# Patient Record
Sex: Female | Born: 1945 | Race: White | Hispanic: No | Marital: Married | Smoking: Former smoker
Health system: Southern US, Community
[De-identification: ages and names within clinical notes are randomized; demographics above are authoritative.]

## PROBLEM LIST (undated history)

## (undated) DIAGNOSIS — M5431 Sciatica, right side: Secondary | ICD-10-CM

## (undated) DIAGNOSIS — C801 Malignant (primary) neoplasm, unspecified: Secondary | ICD-10-CM

## (undated) DIAGNOSIS — I809 Phlebitis and thrombophlebitis of unspecified site: Secondary | ICD-10-CM

## (undated) DIAGNOSIS — E039 Hypothyroidism, unspecified: Secondary | ICD-10-CM

## (undated) DIAGNOSIS — I10 Essential (primary) hypertension: Secondary | ICD-10-CM

## (undated) DIAGNOSIS — M1990 Unspecified osteoarthritis, unspecified site: Secondary | ICD-10-CM

## (undated) DIAGNOSIS — E119 Type 2 diabetes mellitus without complications: Secondary | ICD-10-CM

## (undated) DIAGNOSIS — E079 Disorder of thyroid, unspecified: Secondary | ICD-10-CM

## (undated) DIAGNOSIS — M539 Dorsopathy, unspecified: Secondary | ICD-10-CM

## (undated) DIAGNOSIS — I499 Cardiac arrhythmia, unspecified: Secondary | ICD-10-CM

## (undated) DIAGNOSIS — Z972 Presence of dental prosthetic device (complete) (partial): Secondary | ICD-10-CM

## (undated) DIAGNOSIS — E785 Hyperlipidemia, unspecified: Secondary | ICD-10-CM

## (undated) DIAGNOSIS — M5432 Sciatica, left side: Secondary | ICD-10-CM

## (undated) DIAGNOSIS — J302 Other seasonal allergic rhinitis: Secondary | ICD-10-CM

## (undated) HISTORY — PX: TONSILLECTOMY: SUR1361

## (undated) HISTORY — PX: KNEE ARTHROSCOPY: SUR90

## (undated) HISTORY — PX: TUMOR REMOVAL: SHX12

## (undated) HISTORY — DX: Malignant (primary) neoplasm, unspecified: C80.1

## (undated) HISTORY — DX: Type 2 diabetes mellitus without complications: E11.9

## (undated) HISTORY — DX: Disorder of thyroid, unspecified: E07.9

## (undated) HISTORY — DX: Unspecified osteoarthritis, unspecified site: M19.90

## (undated) HISTORY — PX: ABDOMINAL HYSTERECTOMY: SHX81

## (undated) HISTORY — PX: GRAFT APPLICATION: SHX6696

## (undated) HISTORY — PX: APPENDECTOMY: SHX54

---

## 2010-11-08 DIAGNOSIS — A4902 Methicillin resistant Staphylococcus aureus infection, unspecified site: Secondary | ICD-10-CM

## 2010-11-08 HISTORY — DX: Methicillin resistant Staphylococcus aureus infection, unspecified site: A49.02

## 2016-11-08 DIAGNOSIS — B029 Zoster without complications: Secondary | ICD-10-CM

## 2016-11-08 HISTORY — DX: Zoster without complications: B02.9

## 2019-11-15 ENCOUNTER — Encounter: Admission: RE | Payer: Self-pay | Source: Home / Self Care

## 2019-11-15 ENCOUNTER — Ambulatory Visit
Admission: RE | Admit: 2019-11-15 | Payer: Federal, State, Local not specified - PPO | Source: Home / Self Care | Admitting: Internal Medicine

## 2019-11-15 SURGERY — COLONOSCOPY WITH PROPOFOL
Anesthesia: General

## 2019-11-26 ENCOUNTER — Other Ambulatory Visit: Payer: Self-pay

## 2019-11-26 ENCOUNTER — Encounter (INDEPENDENT_AMBULATORY_CARE_PROVIDER_SITE_OTHER): Payer: Self-pay | Admitting: Vascular Surgery

## 2019-11-26 ENCOUNTER — Ambulatory Visit (INDEPENDENT_AMBULATORY_CARE_PROVIDER_SITE_OTHER): Payer: Federal, State, Local not specified - PPO | Admitting: Vascular Surgery

## 2019-11-26 VITALS — BP 181/72 | HR 76 | Resp 16 | Ht 68.0 in | Wt 201.0 lb

## 2019-11-26 DIAGNOSIS — E039 Hypothyroidism, unspecified: Secondary | ICD-10-CM | POA: Insufficient documentation

## 2019-11-26 DIAGNOSIS — I890 Lymphedema, not elsewhere classified: Secondary | ICD-10-CM | POA: Diagnosis not present

## 2019-11-26 DIAGNOSIS — J309 Allergic rhinitis, unspecified: Secondary | ICD-10-CM | POA: Insufficient documentation

## 2019-11-26 DIAGNOSIS — E785 Hyperlipidemia, unspecified: Secondary | ICD-10-CM | POA: Insufficient documentation

## 2019-11-26 DIAGNOSIS — I10 Essential (primary) hypertension: Secondary | ICD-10-CM | POA: Insufficient documentation

## 2019-11-26 DIAGNOSIS — I83013 Varicose veins of right lower extremity with ulcer of ankle: Secondary | ICD-10-CM | POA: Diagnosis not present

## 2019-11-26 DIAGNOSIS — E119 Type 2 diabetes mellitus without complications: Secondary | ICD-10-CM | POA: Diagnosis not present

## 2019-11-26 DIAGNOSIS — I872 Venous insufficiency (chronic) (peripheral): Secondary | ICD-10-CM

## 2019-11-26 DIAGNOSIS — L97319 Non-pressure chronic ulcer of right ankle with unspecified severity: Secondary | ICD-10-CM

## 2019-11-26 DIAGNOSIS — B029 Zoster without complications: Secondary | ICD-10-CM | POA: Insufficient documentation

## 2019-11-26 DIAGNOSIS — M109 Gout, unspecified: Secondary | ICD-10-CM | POA: Insufficient documentation

## 2019-11-26 DIAGNOSIS — E782 Mixed hyperlipidemia: Secondary | ICD-10-CM

## 2019-11-26 NOTE — Progress Notes (Signed)
MRN : [id number]  [name] is a 74 y.o. (03/05/1946) female who presents with chief complaint of  Chief Complaint  Patient presents with  . New Patient (Initial Visit)    Venous Stasis  .  History of Present Illness:   Patient is seen for evaluation of leg pain and swelling associated with new onset ulceration. The patient first noticed the swelling remotely. The swelling is associated with a burning pain and discoloration. The burning pain and swelling worsens with prolonged dependency and improves with elevation. The pain is unrelated to activity.  The patient notes that in the morning the legs are better but the leg symptoms worsened throughout the course of the day. The patient has also noted a progressive worsening of the discoloration in the ankle and shin area.   The patient notes that an ulcer has developed acutely without specific trauma and since it occurred it has been very slow to heal.  There is a moderate amount of drainage associated with the open area.    The patient denies claudication symptoms or rest pain symptoms.  The patient denies DJD and LS spine disease.  The patient has not had any past angiography, interventions or vascular surgery.  Elevation makes the leg symptoms better, dependency makes them much worse. The patient denies any recent changes in medications.  The patient has not been wearing graduated compression routinely but has tried compression in the past.  The patient denies a history of DVT or PE. There is a prior history of phlebitis. There is no history of primary lymphedema.  No history of malignancies. No history of trauma or groin or pelvic surgery. There is no history of radiation treatment to the groin or pelvis       No outpatient medications have been marked as taking for the 11/26/19 encounter (Office Visit) with Delana Meyer, Dolores Lory, MD.    No past medical history on file.    Social History Social History   Tobacco Use    . Smoking status: Former Smoker    Quit date: 11/25/1994    Years since quitting: 25.0  . Smokeless tobacco: Never Used  Substance Use Topics  . Alcohol use: Not on file  . Drug use: Not on file    Family History No family history on file.  No family history of bleeding/clotting disorders, porphyria or autoimmune disease   Allergies  Allergen Reactions  . Amoxicillin-Pot Clavulanate Nausea And Vomiting  . Codeine      REVIEW OF SYSTEMS (Negative unless checked)  Constitutional: [] Weight loss  [] Fever  [] Chills Cardiac: [] Chest pain   [] Chest pressure   [] Palpitations   [] Shortness of breath when laying flat   [] Shortness of breath with exertion. Vascular:  [] Pain in legs with walking   [x] Pain in legs at rest  [] History of DVT   [x] Phlebitis   [x] Swelling in legs   [x] Varicose veins   [] Non-healing ulcers Pulmonary:   [] Uses home oxygen   [] Productive cough   [] Hemoptysis   [] Wheeze  [] COPD   [] Asthma Neurologic:  [] Dizziness   [] Seizures   [] History of stroke   [] History of TIA  [] Aphasia   [] Vissual changes   [] Weakness or numbness in arm   [] Weakness or numbness in leg Musculoskeletal:   [] Joint swelling   [] Joint pain   [] Low back pain Hematologic:  [] Easy bruising  [] Easy bleeding   [] Hypercoagulable state   [] Anemic Gastrointestinal:  [] Diarrhea   [] Vomiting  [] Gastroesophageal reflux/heartburn   [] Difficulty swallowing.  Genitourinary:  [] Chronic kidney disease   [] Difficult urination  [] Frequent urination   [] Blood in urine Skin:  [] Rashes   [] Ulcers  Psychological:  [] History of anxiety   []  History of major depression.  Physical Examination  Vitals:   11/26/19 0909  BP: (!) 181/72  Pulse: 76  Resp: 16  Weight: 201 lb (91.2 kg)  Height: 5\' 8"  (1.727 m)   Body mass index is 30.56 kg/m. Gen: WD/WN, NAD Head: [place]/AT, No temporalis wasting.  Ear/Nose/Throat: Hearing grossly intact, nares w/o erythema or drainage, poor dentition Eyes: PER, EOMI, sclera nonicteric.   Neck: Supple, no masses.  No bruit or JVD.  Pulmonary:  Good air movement, clear to auscultation bilaterally, no use of accessory muscles.  Cardiac: RRR, normal S1, S2, no Murmurs. Vascular: 2-3+ edema of the right leg with severe venous changes of the right leg.  Venous ulcer noted in the ankle area on the right, noninfected Vessel Right Left  Radial Palpable Palpable  PT Palpable Palpable  DP Palpable Palpable  Gastrointestinal: soft, non-distended. No guarding/no peritoneal signs.  Musculoskeletal: M/S 5/5 throughout.  No deformity or atrophy.  Neurologic: CN 2-12 intact. Pain and light touch intact in extremities.  Symmetrical.  Speech is fluent. Motor exam as listed above. Psychiatric: Judgment intact, Mood & affect appropriate for pt's clinical situation. Dermatologic: Venous stasis dermatitis with ulcers present on the right.  No changes consistent with cellulitis. Lymph : No Cervical lymphadenopathy, no lichenification or skin changes of chronic lymphedema.  CBC No results found for: WBC, HGB, HCT, MCV, PLT  BMET No results found for: NA, K, CL, CO2, GLUCOSE, BUN, CREATININE, CALCIUM, GFRNONAA, GFRAA CrCl cannot be calculated (No successful lab value found.).  COAG No results found for: INR, PROTIME  Radiology No results found.  Assessment/Plan 1. Chronic venous insufficiency No surgery or intervention at this point in time.    I have had a long discussion with the patient regarding venous insufficiency and why it  causes symptoms, specifically venous ulceration . I have discussed with the patient the chronic skin changes that accompany venous insufficiency and the long term sequela such as infection and recurring  ulceration.  Patient will be placed in [employer] which will be changed weekly drainage permitting.  In addition, behavioral modification including several periods of elevation of the lower extremities during the day will be continued. Achieving a position with  the ankles at heart level was stressed to the patient  The patient is instructed to begin routine exercise, especially walking on a daily basis  Patient should undergo duplex ultrasound of the venous system to ensure that DVT or reflux is not present.  Following the review of the ultrasound the patient will follow up in one week to reassess the degree of swelling and the control that Unna therapy is offering.   The patient can be assessed for graduated compression stockings or wraps as well as a Lymph Pump once the ulcers are healed.  - VAS [place] LOWER EXTREMITY VENOUS REFLUX; Future  2. Lymphedema I have had a long discussion with the patient regarding swelling and why it  causes symptoms.  Patient will begin wearing graduated compression stockings class 1 (20-30 mmHg) on a daily basis a prescription was given. The patient will  beginning wearing the stockings first thing in the morning and removing them in the evening. The patient is instructed specifically not to sleep in the stockings.   In addition, behavioral modification will be initiated.  This will  include frequent elevation, use of over the counter pain medications and exercise such as walking.  I have reviewed systemic causes for chronic edema such as liver, kidney and cardiac etiologies.  The patient denies problems with these organ systems.    Consideration for a lymph pump will also be made based upon the effectiveness of conservative therapy.  This would help to improve the edema control and prevent sequela such as ulcers and infections   Patient should undergo duplex ultrasound of the venous system to ensure that DVT or reflux is not present.  The patient will follow-up with me after the ultrasound.    3. Venous ulcer of ankle, right (HCC) No surgery or intervention at this point in time.    I have had a long discussion with the patient regarding venous insufficiency and why it  causes symptoms, specifically venous ulceration  . I have discussed with the patient the chronic skin changes that accompany venous insufficiency and the long term sequela such as infection and recurring  ulceration.  Patient will be placed in [employer] which will be changed weekly drainage permitting.  In addition, behavioral modification including several periods of elevation of the lower extremities during the day will be continued. Achieving a position with the ankles at heart level was stressed to the patient  The patient is instructed to begin routine exercise, especially walking on a daily basis  Patient should undergo duplex ultrasound of the venous system to ensure that DVT or reflux is not present.  Following the review of the ultrasound the patient will follow up in one week to reassess the degree of swelling and the control that Unna therapy is offering.   The patient can be assessed for graduated compression stockings or wraps as well as a Lymph Pump once the ulcers are healed.  - VAS [place] LOWER EXTREMITY VENOUS REFLUX; Future  4. Type 2 diabetes mellitus without complication, without long-term current use of insulin (HCC) Continue hypoglycemic medications as already ordered, these medications have been reviewed and there are no changes at this time.  Hgb A1C to be monitored as already arranged by primary service   5. Mixed hyperlipidemia Continue statin as ordered and reviewed, no changes at this time    Hortencia Pilar, MD  11/26/2019 9:16 AM

## 2019-12-03 ENCOUNTER — Ambulatory Visit (INDEPENDENT_AMBULATORY_CARE_PROVIDER_SITE_OTHER): Payer: Federal, State, Local not specified - PPO | Admitting: Nurse Practitioner

## 2019-12-03 ENCOUNTER — Other Ambulatory Visit: Payer: Self-pay

## 2019-12-03 VITALS — BP 183/71 | HR 82 | Resp 16 | Wt 200.8 lb

## 2019-12-03 DIAGNOSIS — I83013 Varicose veins of right lower extremity with ulcer of ankle: Secondary | ICD-10-CM | POA: Diagnosis not present

## 2019-12-03 DIAGNOSIS — L97319 Non-pressure chronic ulcer of right ankle with unspecified severity: Secondary | ICD-10-CM | POA: Diagnosis not present

## 2019-12-03 NOTE — Progress Notes (Signed)
History of Present Illness  There is no documented history at this time  Assessments & Plan   There are no diagnoses linked to this encounter.    Additional instructions  Subjective:  Patient presents with venous ulcer of the Right lower extremity.    Procedure:  3 layer unna wrap was placed Right lower extremity.   Plan:   Follow up in one week.   

## 2019-12-10 ENCOUNTER — Ambulatory Visit (INDEPENDENT_AMBULATORY_CARE_PROVIDER_SITE_OTHER): Payer: Federal, State, Local not specified - PPO | Admitting: Nurse Practitioner

## 2019-12-10 ENCOUNTER — Other Ambulatory Visit: Payer: Self-pay

## 2019-12-10 ENCOUNTER — Encounter (INDEPENDENT_AMBULATORY_CARE_PROVIDER_SITE_OTHER): Payer: Self-pay

## 2019-12-10 VITALS — BP 179/72 | HR 82 | Resp 15 | Wt 201.2 lb

## 2019-12-10 DIAGNOSIS — L97319 Non-pressure chronic ulcer of right ankle with unspecified severity: Secondary | ICD-10-CM | POA: Diagnosis not present

## 2019-12-10 DIAGNOSIS — I83013 Varicose veins of right lower extremity with ulcer of ankle: Secondary | ICD-10-CM | POA: Diagnosis not present

## 2019-12-10 NOTE — Progress Notes (Signed)
History of Present Illness  There is no documented history at this time  Assessments & Plan   There are no diagnoses linked to this encounter.    Additional instructions  Subjective:  Patient presents with venous ulcer of the Right lower extremity.    Procedure:  3 layer unna wrap was placed Right lower extremity.   Plan:   Follow up in one week.   

## 2019-12-17 ENCOUNTER — Encounter (INDEPENDENT_AMBULATORY_CARE_PROVIDER_SITE_OTHER): Payer: Medicare Other

## 2019-12-18 ENCOUNTER — Encounter (INDEPENDENT_AMBULATORY_CARE_PROVIDER_SITE_OTHER): Payer: Self-pay | Admitting: Nurse Practitioner

## 2019-12-18 ENCOUNTER — Other Ambulatory Visit: Payer: Self-pay

## 2019-12-18 ENCOUNTER — Ambulatory Visit (INDEPENDENT_AMBULATORY_CARE_PROVIDER_SITE_OTHER): Payer: Federal, State, Local not specified - PPO | Admitting: Nurse Practitioner

## 2019-12-18 VITALS — BP 160/70 | HR 73 | Resp 12 | Ht 68.0 in | Wt 200.0 lb

## 2019-12-18 DIAGNOSIS — I890 Lymphedema, not elsewhere classified: Secondary | ICD-10-CM | POA: Diagnosis not present

## 2019-12-18 NOTE — Progress Notes (Signed)
History of Present Illness  There is no documented history at this time  Assessments & Plan   There are no diagnoses linked to this encounter.    Additional instructions  Subjective:  Patient presents with venous ulcer of the Right lower extremity.    Procedure:  3 layer unna wrap was placed Right lower extremity.   Plan:   Follow up in one week.   

## 2019-12-24 ENCOUNTER — Encounter (INDEPENDENT_AMBULATORY_CARE_PROVIDER_SITE_OTHER): Payer: Self-pay | Admitting: Vascular Surgery

## 2019-12-24 ENCOUNTER — Ambulatory Visit (INDEPENDENT_AMBULATORY_CARE_PROVIDER_SITE_OTHER): Payer: Federal, State, Local not specified - PPO

## 2019-12-24 ENCOUNTER — Ambulatory Visit (INDEPENDENT_AMBULATORY_CARE_PROVIDER_SITE_OTHER): Payer: Federal, State, Local not specified - PPO | Admitting: Vascular Surgery

## 2019-12-24 ENCOUNTER — Other Ambulatory Visit: Payer: Self-pay

## 2019-12-24 VITALS — BP 205/79 | HR 86 | Resp 14 | Ht 68.0 in | Wt 199.0 lb

## 2019-12-24 DIAGNOSIS — I872 Venous insufficiency (chronic) (peripheral): Secondary | ICD-10-CM | POA: Diagnosis not present

## 2019-12-24 DIAGNOSIS — I890 Lymphedema, not elsewhere classified: Secondary | ICD-10-CM | POA: Diagnosis not present

## 2019-12-24 DIAGNOSIS — E782 Mixed hyperlipidemia: Secondary | ICD-10-CM

## 2019-12-24 DIAGNOSIS — L97319 Non-pressure chronic ulcer of right ankle with unspecified severity: Secondary | ICD-10-CM

## 2019-12-24 DIAGNOSIS — I83013 Varicose veins of right lower extremity with ulcer of ankle: Secondary | ICD-10-CM

## 2019-12-24 DIAGNOSIS — I10 Essential (primary) hypertension: Secondary | ICD-10-CM | POA: Diagnosis not present

## 2019-12-24 DIAGNOSIS — E119 Type 2 diabetes mellitus without complications: Secondary | ICD-10-CM

## 2019-12-24 NOTE — Progress Notes (Signed)
MRN : [id number]  Marvelle Morrell is a 74 y.o. (Jul 13, 1946) female who presents with chief complaint of No chief complaint on file. Marland Kitchen  History of Present Illness:   The patient returns to the office for followup evaluation regarding leg swelling.  The swelling has persisted and the pain associated with swelling continues. There have not been any interval development of a ulcerations or wounds.  Since the previous visit the patient has been wearing graduated compression stockings and has noted little if any improvement in the lymphedema. The patient has been using compression routinely morning until night.  The patient also states elevation during the day and exercise is being done too.  Duplex ultrasound of the venous system shows normal deep venous system.  Superficial reflux is noted in the bilateral great saphenous veins.  There is also reflux noted at the saphenous popliteal junction bilaterally.  No outpatient medications have been marked as taking for the 12/24/19 encounter (Appointment) with Delana Meyer, Dolores Lory, MD.    Past Medical History:  Diagnosis Date  . Arthritis   . Cancer ([place])   . Diabetes mellitus without complication ([place])   . Thyroid disease     Past Surgical History:  Procedure Laterality Date  . ABDOMINAL HYSTERECTOMY    . GRAFT APPLICATION    . TUMOR REMOVAL      Social History Social History   Tobacco Use  . Smoking status: Former Smoker    Quit date: 11/25/1994    Years since quitting: 25.0  . Smokeless tobacco: Never Used  Substance Use Topics  . Alcohol use: Not on file  . Drug use: Not on file    Family History Family History  Problem Relation Age of Onset  . Varicose Veins Mother   . Heart disease Mother   . Heart disease Father   . Diabetes Father   . Heart disease Brother   . Diabetes Brother   . Cancer Daughter     Allergies  Allergen Reactions  . Amoxicillin-Pot Clavulanate Nausea And Vomiting  . Codeine      REVIEW OF  SYSTEMS (Negative unless checked)  Constitutional: [] Weight loss  [] Fever  [] Chills Cardiac: [] Chest pain   [] Chest pressure   [] Palpitations   [] Shortness of breath when laying flat   [] Shortness of breath with exertion. Vascular:  [] Pain in legs with walking   [x] Pain in legs at rest  [] History of DVT   [] Phlebitis   [x] Swelling in legs   [] Varicose veins   [] Non-healing ulcers Pulmonary:   [] Uses home oxygen   [] Productive cough   [] Hemoptysis   [] Wheeze  [] COPD   [] Asthma Neurologic:  [] Dizziness   [] Seizures   [] History of stroke   [] History of TIA  [] Aphasia   [] Vissual changes   [] Weakness or numbness in arm   [] Weakness or numbness in leg Musculoskeletal:   [] Joint swelling   [] Joint pain   [] Low back pain Hematologic:  [] Easy bruising  [] Easy bleeding   [] Hypercoagulable state   [] Anemic Gastrointestinal:  [] Diarrhea   [] Vomiting  [] Gastroesophageal reflux/heartburn   [] Difficulty swallowing. Genitourinary:  [] Chronic kidney disease   [] Difficult urination  [] Frequent urination   [] Blood in urine Skin:  [] Rashes   [] Ulcers  Psychological:  [] History of anxiety   []  History of major depression.  Physical Examination  There were no vitals filed for this visit. There is no height or weight on file to calculate BMI. Gen: WD/WN, NAD Head: [place]/AT, No temporalis wasting.  Ear/Nose/Throat: Hearing  grossly intact, nares w/o erythema or drainage Eyes: PER, EOMI, sclera nonicteric.  Neck: Supple, no large masses.   Pulmonary:  Good air movement, no audible wheezing bilaterally, no use of accessory muscles.  Cardiac: RRR, no JVD Vascular: scattered varicosities 6-8 mm present bilaterally.  Mild venous stasis changes to the legs bilaterally, with a severe patch at the right ankle.  2+ soft pitting edema Gastrointestinal: Non-distended. No guarding/no peritoneal signs.  Musculoskeletal: M/S 5/5 throughout.  No deformity or atrophy.  Neurologic: CN 2-12 intact. Symmetrical.  Speech is fluent.  Motor exam as listed above. Psychiatric: Judgment intact, Mood & affect appropriate for pt's clinical situation. Dermatologic: venous rashes no ulcers noted.  No changes consistent with cellulitis. Lymph : No lichenification or skin changes of chronic lymphedema.  CBC No results found for: WBC, HGB, HCT, MCV, PLT  BMET No results found for: NA, K, CL, CO2, GLUCOSE, BUN, CREATININE, CALCIUM, GFRNONAA, GFRAA CrCl cannot be calculated (No successful lab value found.).  COAG No results found for: INR, PROTIME  Radiology No results found.   Assessment/Plan 1. Chronic venous insufficiency Recommend  I have reviewed my previous  discussion with the patient regarding  varicose veins and why they cause symptoms. Patient will continue  wearing graduated compression stockings class 1 on a daily basis, beginning first thing in the morning and removing them in the evening.    In addition, behavioral modification including elevation during the day was again discussed and this will continue.  The patient has utilized over the counter pain medications and has been exercising.  However, at this time conservative therapy has not alleviated the patient's symptoms of leg pain and swelling  I discussed laser ablation of the right and  left great saphenous veins to eliminate the symptoms of pain and swelling of the lower extremities caused by the severe superficial venous reflux disease. She is OK with continuing compression.  She has several issues coming to take care of so she does not want to pursue ablation at this time  2. Lymphedema Recommend  I have reviewed my previous  discussion with the patient regarding  varicose veins and why they cause symptoms. Patient will continue  wearing graduated compression stockings class 1 on a daily basis, beginning first thing in the morning and removing them in the evening.    In addition, behavioral modification including elevation during the day was again  discussed and this will continue.  The patient has utilized over the counter pain medications and has been exercising.  However, at this time conservative therapy has not alleviated the patient's symptoms of leg pain and swelling  I discussed laser ablation of the right and  left great saphenous veins to eliminate the symptoms of pain and swelling of the lower extremities caused by the severe superficial venous reflux disease. She is OK with continuing compression.  She has several issues coming to take care of so she does not want to pursue ablation at this time   3. Mixed hyperlipidemia Continue statin as ordered and reviewed, no changes at this time   4. Essential hypertension Continue antihypertensive medications as already ordered, these medications have been reviewed and there are no changes at this time.   5. Type 2 diabetes mellitus without complication, without long-term current use of insulin (HCC) Continue hypoglycemic medications as already ordered, these medications have been reviewed and there are no changes at this time.  Hgb A1C to be monitored as already arranged by primary service  Hortencia Pilar, MD  12/24/2019 11:07 AM

## 2019-12-25 ENCOUNTER — Other Ambulatory Visit: Payer: Self-pay | Admitting: Infectious Diseases

## 2019-12-25 DIAGNOSIS — I10 Essential (primary) hypertension: Secondary | ICD-10-CM

## 2019-12-29 ENCOUNTER — Ambulatory Visit: Payer: Federal, State, Local not specified - PPO | Attending: Internal Medicine

## 2019-12-29 ENCOUNTER — Ambulatory Visit: Payer: Self-pay

## 2019-12-29 DIAGNOSIS — Z23 Encounter for immunization: Secondary | ICD-10-CM | POA: Insufficient documentation

## 2019-12-29 NOTE — Progress Notes (Signed)
   Covid-19 Vaccination Clinic  Name:  [name]    MRN: [id number] DOB: 06-Jul-1946  12/29/2019  [name] was observed post Covid-19 immunization for 15 minutes without incidence. She was provided with Vaccine Information Sheet and instruction to access the V-Safe system.   [name] was instructed to call 911 with any severe reactions post vaccine: Marland Kitchen Difficulty breathing  . Swelling of your face and throat  . A fast heartbeat  . A bad rash all over your body  . Dizziness and weakness    Immunizations Administered    Name Date Dose VIS Date Route   Pfizer COVID-19 Vaccine 12/29/2019 12:13 PM 0.3 mL 10/19/2019 Intramuscular   Manufacturer: [place]   Lot: [id number]   [place]: [id number]

## 2020-01-22 ENCOUNTER — Ambulatory Visit: Payer: Federal, State, Local not specified - PPO | Attending: Internal Medicine

## 2020-01-22 DIAGNOSIS — Z23 Encounter for immunization: Secondary | ICD-10-CM

## 2020-01-22 NOTE — Progress Notes (Signed)
   Covid-19 Vaccination Clinic  Name:  [name]    MRN: [id number] DOB: 21-Feb-1946  01/22/2020  [name] was observed post Covid-19 immunization for 15 minutes without incident. She was provided with Vaccine Information Sheet and instruction to access the V-Safe system.   [name] was instructed to call 911 with any severe reactions post vaccine: Marland Kitchen Difficulty breathing  . Swelling of face and throat  . A fast heartbeat  . A bad rash all over body  . Dizziness and weakness   Immunizations Administered    Name Date Dose VIS Date Route   Pfizer COVID-19 Vaccine 01/22/2020  3:16 PM 0.3 mL 10/19/2019 Intramuscular   Manufacturer: [place]   Lot: [id number]   [place]: [id number]

## 2020-02-05 ENCOUNTER — Encounter: Payer: Self-pay | Admitting: Ophthalmology

## 2020-02-05 ENCOUNTER — Other Ambulatory Visit: Payer: Self-pay

## 2020-02-07 NOTE — Discharge Instructions (Signed)

## 2020-02-11 ENCOUNTER — Other Ambulatory Visit: Payer: Self-pay

## 2020-02-11 ENCOUNTER — Other Ambulatory Visit
Admission: RE | Admit: 2020-02-11 | Discharge: 2020-02-11 | Disposition: A | Discharge status: Home / Self Care | Payer: Federal, State, Local not specified - PPO | Source: Ambulatory Visit | Attending: Ophthalmology | Admitting: Ophthalmology

## 2020-02-11 DIAGNOSIS — Z20822 Contact with and (suspected) exposure to covid-19: Secondary | ICD-10-CM | POA: Insufficient documentation

## 2020-02-11 DIAGNOSIS — Z01812 Encounter for preprocedural laboratory examination: Secondary | ICD-10-CM | POA: Diagnosis not present

## 2020-02-11 LAB — SARS CORONAVIRUS 2 (TAT 6-24 HRS): SARS Coronavirus 2: NEGATIVE

## 2020-02-13 ENCOUNTER — Encounter
Admission: RE | Disposition: A | Discharge status: Home / Self Care | Payer: Self-pay | Source: Home / Self Care | Attending: Ophthalmology

## 2020-02-13 ENCOUNTER — Ambulatory Visit
Admission: RE | Admit: 2020-02-13 | Discharge: 2020-02-13 | Disposition: A | Discharge status: Home / Self Care | Payer: Federal, State, Local not specified - PPO | Attending: Ophthalmology | Admitting: Ophthalmology

## 2020-02-13 ENCOUNTER — Encounter: Payer: Self-pay | Admitting: Ophthalmology

## 2020-02-13 ENCOUNTER — Other Ambulatory Visit: Payer: Self-pay

## 2020-02-13 ENCOUNTER — Ambulatory Visit: Payer: Federal, State, Local not specified - PPO | Admitting: Anesthesiology

## 2020-02-13 DIAGNOSIS — I10 Essential (primary) hypertension: Secondary | ICD-10-CM | POA: Diagnosis not present

## 2020-02-13 DIAGNOSIS — E669 Obesity, unspecified: Secondary | ICD-10-CM | POA: Insufficient documentation

## 2020-02-13 DIAGNOSIS — Z6830 Body mass index (BMI) 30.0-30.9, adult: Secondary | ICD-10-CM | POA: Insufficient documentation

## 2020-02-13 DIAGNOSIS — Z79899 Other long term (current) drug therapy: Secondary | ICD-10-CM | POA: Diagnosis not present

## 2020-02-13 DIAGNOSIS — M549 Dorsalgia, unspecified: Secondary | ICD-10-CM | POA: Insufficient documentation

## 2020-02-13 DIAGNOSIS — J302 Other seasonal allergic rhinitis: Secondary | ICD-10-CM | POA: Insufficient documentation

## 2020-02-13 DIAGNOSIS — H2511 Age-related nuclear cataract, right eye: Secondary | ICD-10-CM | POA: Diagnosis not present

## 2020-02-13 DIAGNOSIS — Z85820 Personal history of malignant melanoma of skin: Secondary | ICD-10-CM | POA: Insufficient documentation

## 2020-02-13 DIAGNOSIS — M5430 Sciatica, unspecified side: Secondary | ICD-10-CM | POA: Insufficient documentation

## 2020-02-13 DIAGNOSIS — M1990 Unspecified osteoarthritis, unspecified site: Secondary | ICD-10-CM | POA: Insufficient documentation

## 2020-02-13 DIAGNOSIS — G629 Polyneuropathy, unspecified: Secondary | ICD-10-CM | POA: Insufficient documentation

## 2020-02-13 DIAGNOSIS — E039 Hypothyroidism, unspecified: Secondary | ICD-10-CM | POA: Diagnosis not present

## 2020-02-13 DIAGNOSIS — E119 Type 2 diabetes mellitus without complications: Secondary | ICD-10-CM | POA: Diagnosis not present

## 2020-02-13 DIAGNOSIS — Z87891 Personal history of nicotine dependence: Secondary | ICD-10-CM | POA: Diagnosis not present

## 2020-02-13 DIAGNOSIS — Z79890 Hormone replacement therapy (postmenopausal): Secondary | ICD-10-CM | POA: Insufficient documentation

## 2020-02-13 DIAGNOSIS — E785 Hyperlipidemia, unspecified: Secondary | ICD-10-CM | POA: Diagnosis not present

## 2020-02-13 DIAGNOSIS — Z791 Long term (current) use of non-steroidal anti-inflammatories (NSAID): Secondary | ICD-10-CM | POA: Diagnosis not present

## 2020-02-13 DIAGNOSIS — Z880 Allergy status to penicillin: Secondary | ICD-10-CM | POA: Insufficient documentation

## 2020-02-13 HISTORY — DX: Hyperlipidemia, unspecified: E78.5

## 2020-02-13 HISTORY — DX: Sciatica, right side: M54.31

## 2020-02-13 HISTORY — DX: Phlebitis and thrombophlebitis of unspecified site: I80.9

## 2020-02-13 HISTORY — DX: Hypothyroidism, unspecified: E03.9

## 2020-02-13 HISTORY — DX: Cardiac arrhythmia, unspecified: I49.9

## 2020-02-13 HISTORY — DX: Essential (primary) hypertension: I10

## 2020-02-13 HISTORY — DX: Dorsopathy, unspecified: M53.9

## 2020-02-13 HISTORY — PX: CATARACT EXTRACTION W/PHACO: SHX586

## 2020-02-13 HISTORY — DX: Presence of dental prosthetic device (complete) (partial): Z97.2

## 2020-02-13 HISTORY — DX: Sciatica, left side: M54.32

## 2020-02-13 HISTORY — DX: Other seasonal allergic rhinitis: J30.2

## 2020-02-13 SURGERY — PHACOEMULSIFICATION, CATARACT, WITH IOL INSERTION
Anesthesia: Monitor Anesthesia Care | Site: Eye | Laterality: Right

## 2020-02-13 MED ORDER — ARMC OPHTHALMIC DILATING DROPS
1.0000 "application " | OPHTHALMIC | Status: DC | PRN
  Administered 2020-02-13 (×3): 1 via OPHTHALMIC

## 2020-02-13 MED ORDER — EPINEPHRINE PF 1 MG/ML IJ SOLN
INTRAOCULAR | Status: DC | PRN
  Administered 2020-02-13: 104 mL via OPHTHALMIC

## 2020-02-13 MED ORDER — NA HYALUR & NA CHOND-NA HYALUR 0.4-0.35 ML IO KIT
PACK | INTRAOCULAR | Status: DC | PRN
  Administered 2020-02-13: 1 mL via INTRAOCULAR

## 2020-02-13 MED ORDER — BRIMONIDINE TARTRATE-TIMOLOL 0.2-0.5 % OP SOLN
OPHTHALMIC | Status: DC | PRN
  Administered 2020-02-13: 1 [drp] via OPHTHALMIC

## 2020-02-13 MED ORDER — FENTANYL CITRATE (PF) 100 MCG/2ML IJ SOLN
INTRAMUSCULAR | Status: DC | PRN
  Administered 2020-02-13: 50 ug via INTRAVENOUS

## 2020-02-13 MED ORDER — MIDAZOLAM HCL 2 MG/2ML IJ SOLN
INTRAMUSCULAR | Status: DC | PRN
  Administered 2020-02-13: 1 mg via INTRAVENOUS

## 2020-02-13 MED ORDER — LIDOCAINE HCL (PF) 2 % IJ SOLN
INTRAOCULAR | Status: DC | PRN
  Administered 2020-02-13: 1 mL

## 2020-02-13 MED ORDER — TETRACAINE HCL 0.5 % OP SOLN
1.0000 [drp] | OPHTHALMIC | Status: DC | PRN
  Administered 2020-02-13 (×3): 1 [drp] via OPHTHALMIC

## 2020-02-13 MED ORDER — CEFUROXIME OPHTHALMIC INJECTION 1 MG/0.1 ML
INJECTION | OPHTHALMIC | Status: DC | PRN
  Administered 2020-02-13: 0.1 mL via INTRACAMERAL

## 2020-02-13 MED ORDER — MOXIFLOXACIN HCL 0.5 % OP SOLN
1.0000 [drp] | OPHTHALMIC | Status: DC | PRN
  Administered 2020-02-13 (×3): 1 [drp] via OPHTHALMIC

## 2020-02-13 SURGICAL SUPPLY — 22 items
CANNULA ANT/CHMB 27G (MISCELLANEOUS) ×1 IMPLANT
CANNULA ANT/CHMB 27GA (MISCELLANEOUS) ×2 IMPLANT
GLOVE SURG LX 7.5 STRW (GLOVE) ×1
GLOVE SURG LX STRL 7.5 STRW (GLOVE) ×1 IMPLANT
GLOVE SURG TRIUMPH 8.0 PF LTX (GLOVE) ×2 IMPLANT
GOWN STRL REUS W/ TWL LRG LVL3 (GOWN DISPOSABLE) ×2 IMPLANT
GOWN STRL REUS W/TWL LRG LVL3 (GOWN DISPOSABLE) ×2
LENS IOL TECNIS ITEC 23.5 (Intraocular Lens) ×1 IMPLANT
MARKER SKIN DUAL TIP RULER LAB (MISCELLANEOUS) ×2 IMPLANT
NDL CAPSULORHEX 25GA (NEEDLE) ×1 IMPLANT
NDL FILTER BLUNT 18X1 1/2 (NEEDLE) ×2 IMPLANT
NEEDLE CAPSULORHEX 25GA (NEEDLE) ×2 IMPLANT
NEEDLE FILTER BLUNT 18X 1/2SAF (NEEDLE) ×2
NEEDLE FILTER BLUNT 18X1 1/2 (NEEDLE) ×2 IMPLANT
PACK CATARACT BRASINGTON (MISCELLANEOUS) ×2 IMPLANT
PACK EYE AFTER SURG (MISCELLANEOUS) ×2 IMPLANT
PACK OPTHALMIC (MISCELLANEOUS) ×2 IMPLANT
SOLUTION OPHTHALMIC SALT (MISCELLANEOUS) ×2 IMPLANT
SYR 3ML LL SCALE MARK (SYRINGE) ×4 IMPLANT
SYR TB 1ML LUER SLIP (SYRINGE) ×2 IMPLANT
WATER STERILE IRR 250ML POUR (IV SOLUTION) ×2 IMPLANT
WIPE NON LINTING 3.25X3.25 (MISCELLANEOUS) ×2 IMPLANT

## 2020-02-13 NOTE — Transfer of Care (Signed)
Immediate Anesthesia Transfer of Care Note  Patient: [name]  Procedure(s) Performed: CATARACT EXTRACTION PHACO AND INTRAOCULAR LENS PLACEMENT (IOC) RIGHT 18.82  02:15.0  13.9% (Right Eye)  Patient Location: PACU  Anesthesia Type: MAC  Level of Consciousness: awake, alert  and patient cooperative  Airway and Oxygen Therapy: Patient Spontanous Breathing and Patient connected to supplemental oxygen  Post-op Assessment: Post-op Vital signs reviewed, Patient's Cardiovascular Status Stable, Respiratory Function Stable, Patent Airway and No signs of Nausea or vomiting  Post-op Vital Signs: Reviewed and stable  Complications: No apparent anesthesia complications

## 2020-02-13 NOTE — Op Note (Signed)
LOCATION:  [place]   PREOPERATIVE DIAGNOSIS:    Nuclear sclerotic cataract right eye. H25.11   POSTOPERATIVE DIAGNOSIS:  Nuclear sclerotic cataract right eye.     PROCEDURE:  Phacoemusification with posterior chamber intraocular lens placement of the right eye   ULTRASOUND TIME: Procedure(s) with comments: CATARACT EXTRACTION PHACO AND INTRAOCULAR LENS PLACEMENT (IOC) RIGHT 18.82  02:15.0  13.9% (Right) - Diabetic - diet controlled  LENS:   Implant Name Type Inv. Item Serial No. Manufacturer Lot No. LRB No. Used Action  LENS IOL DIOP 23.5 - [id number] Intraocular Lens LENS IOL DIOP 23.5 [id number]  Right 1 Implanted         SURGEON:  Wyonia Hough, MD   ANESTHESIA:  Topical with tetracaine drops and 2% Xylocaine jelly, augmented with 1% preservative-free intracameral lidocaine.    COMPLICATIONS:  None.   DESCRIPTION OF PROCEDURE:  The patient was identified in the holding room and transported to the operating room and placed in the supine position under the operating microscope.  The right eye was identified as the operative eye and it was prepped and draped in the usual sterile ophthalmic fashion.   A 1 millimeter clear-corneal paracentesis was made at the 12:00 position.  0.5 ml of preservative-free 1% lidocaine was injected into the anterior chamber. The anterior chamber was filled with Viscoat viscoelastic.  A 2.4 millimeter keratome was used to make a near-clear corneal incision at the 9:00 position.  A curvilinear capsulorrhexis was made with a cystotome and capsulorrhexis forceps.  Balanced salt solution was used to hydrodissect and hydrodelineate the nucleus.   Phacoemulsification was then used in stop and chop fashion to remove the lens nucleus and epinucleus.  The remaining cortex was then removed using the irrigation and aspiration handpiece. Provisc was then placed into the capsular bag to distend it for lens placement.  A lens was then injected  into the capsular bag.  The remaining viscoelastic was aspirated.   Wounds were hydrated with balanced salt solution.  The anterior chamber was inflated to a physiologic pressure with balanced salt solution.  No wound leaks were noted. Cefuroxime 0.1 ml of a 10mg /ml solution was injected into the anterior chamber for a dose of 1 mg of intracameral antibiotic at the completion of the case.   Timolol and Brimonidine drops were applied to the eye.  The patient was taken to the recovery room in stable condition without complications of anesthesia or surgery.   Brown Dunlap 02/13/2020, 8:38 AM

## 2020-02-13 NOTE — Anesthesia Preprocedure Evaluation (Signed)
Anesthesia Evaluation  Patient identified by MRN, date of birth, ID band Patient awake    History of Anesthesia Complications Negative for: history of anesthetic complications  Airway Mallampati: II  TM Distance: >3 FB Neck ROM: Full    Dental  (+) Partial Lower   Pulmonary former smoker,    Pulmonary exam normal        Cardiovascular hypertension, Pt. on medications and Pt. on home beta blockers Normal cardiovascular exam+ dysrhythmias (occ palpitations)      Neuro/Psych  Neuromuscular disease (chronic back pain and sciatica)    GI/Hepatic negative GI ROS, Neg liver ROS,   Endo/Other  diabetesHypothyroidism Obesity BMI 30  Renal/GU      Musculoskeletal   Abdominal   Peds  Hematology negative hematology ROS (+)   Anesthesia Other Findings   Reproductive/Obstetrics                             Anesthesia Physical Anesthesia Plan  ASA: II  Anesthesia Plan: MAC   Post-op Pain Management:    Induction: Intravenous  PONV Risk Score and Plan: 2 and Midazolam, Treatment may vary due to age or medical condition and TIVA  Airway Management Planned: Natural Airway and Nasal Cannula  Additional Equipment: None  Intra-op Plan:   Post-operative Plan:   Informed Consent: I have reviewed the patients History and Physical, chart, labs and discussed the procedure including the risks, benefits and alternatives for the proposed anesthesia with the patient or authorized representative who has indicated his/her understanding and acceptance.       Plan Discussed with: CRNA  Anesthesia Plan Comments:         Anesthesia Quick Evaluation

## 2020-02-13 NOTE — Anesthesia Postprocedure Evaluation (Signed)
Anesthesia Post Note  Patient: [name]  Procedure(s) Performed: CATARACT EXTRACTION PHACO AND INTRAOCULAR LENS PLACEMENT (IOC) RIGHT 18.82  02:15.0  13.9% (Right Eye)     Patient location during evaluation: PACU Anesthesia Type: MAC Level of consciousness: awake and alert Pain management: pain level controlled Vital Signs Assessment: post-procedure vital signs reviewed and stable Respiratory status: spontaneous breathing Cardiovascular status: blood pressure returned to baseline Postop Assessment: no apparent nausea or vomiting, adequate PO intake and no headache Anesthetic complications: no    Adele Barthel Harvir Patry

## 2020-02-13 NOTE — Anesthesia Procedure Notes (Signed)
Procedure Name: MAC Performed by: Izetta Dakin, CRNA Pre-anesthesia Checklist: Timeout performed, Suction available, Patient being monitored, Emergency Drugs available and Patient identified Patient Re-evaluated:Patient Re-evaluated prior to induction Oxygen Delivery Method: Nasal cannula

## 2020-02-13 NOTE — H&P (Signed)

## 2020-02-14 ENCOUNTER — Encounter: Payer: Self-pay | Admitting: *Deleted

## 2020-06-23 ENCOUNTER — Other Ambulatory Visit: Payer: Self-pay

## 2020-06-23 ENCOUNTER — Encounter (INDEPENDENT_AMBULATORY_CARE_PROVIDER_SITE_OTHER): Payer: Self-pay | Admitting: Vascular Surgery

## 2020-06-23 ENCOUNTER — Ambulatory Visit (INDEPENDENT_AMBULATORY_CARE_PROVIDER_SITE_OTHER): Payer: Federal, State, Local not specified - PPO | Admitting: Vascular Surgery

## 2020-06-23 VITALS — BP 154/70 | HR 67 | Ht 68.0 in | Wt 200.0 lb

## 2020-06-23 DIAGNOSIS — E119 Type 2 diabetes mellitus without complications: Secondary | ICD-10-CM

## 2020-06-23 DIAGNOSIS — E782 Mixed hyperlipidemia: Secondary | ICD-10-CM

## 2020-06-23 DIAGNOSIS — I872 Venous insufficiency (chronic) (peripheral): Secondary | ICD-10-CM | POA: Diagnosis not present

## 2020-06-23 DIAGNOSIS — I10 Essential (primary) hypertension: Secondary | ICD-10-CM | POA: Diagnosis not present

## 2020-06-23 DIAGNOSIS — I890 Lymphedema, not elsewhere classified: Secondary | ICD-10-CM

## 2020-06-23 NOTE — Progress Notes (Signed)
MRN : [id number]  [name] is a 74 y.o. (1946/07/03) female who presents with chief complaint of  Chief Complaint  Patient presents with  . Follow-up    U/S follow up  .  History of Present Illness:   The patient returns to the office for followup evaluation regarding leg swelling.  The swelling has improved quite a bit and the pain associated with swelling has decreased substantially. There have not been any interval development of a ulcerations or wounds.  Since the previous visit the patient has been wearing graduated compression stockings and has noted little significant improvement in the lymphedema. The patient has been using compression routinely morning until night.  The patient also states elevation during the day and exercise is being done too.  Current Meds  Medication Sig  . allopurinol (ZYLOPRIM) 300 MG tablet Take by mouth.  Marland Kitchen amLODipine (NORVASC) 10 MG tablet Take 10 mg by mouth daily.  Marland Kitchen ascorbic acid (VITAMIN C) 500 MG tablet Take by mouth.  . benzonatate (TESSALON) 100 MG capsule Take by mouth.  . bisoprolol (ZEBETA) 10 MG tablet   . celecoxib (CELEBREX) 200 MG capsule Take by mouth.  . cholecalciferol (VITAMIN D3) 25 MCG (1000 UNIT) tablet Take 1,000 Units by mouth daily.  . colchicine 0.6 MG tablet Take by mouth.  . cyclobenzaprine (FLEXERIL) 10 MG tablet Take by mouth.  . Difluprednate (DUREZOL) 0.05 % EMUL Apply to eye.  . famotidine (PEPCID) 10 MG tablet Take by mouth.  . fluticasone (FLONASE) 50 MCG/ACT nasal spray SHAKE LQ AND U 2 SPRAYS IEN QD PRN  . furosemide (LASIX) 20 MG tablet Take 20 mg by mouth daily.  Marland Kitchen gabapentin (NEURONTIN) 300 MG capsule Take 300-600 mg by mouth 3 (three) times daily.  . hydrALAZINE (APRESOLINE) 50 MG tablet Take by mouth.  . levothyroxine (SYNTHROID) 75 MCG tablet Take by mouth.  . loperamide (IMODIUM A-D) 2 MG tablet Take by mouth.  . losartan (COZAAR) 100 MG tablet Take 100 mg by mouth daily.  . metFORMIN  (GLUCOPHAGE) 500 MG tablet Take 500 mg by mouth 2 (two) times daily.  . rosuvastatin (CRESTOR) 10 MG tablet Take by mouth.  . triamcinolone ointment (KENALOG) 0.1 % Apply topically.  . valACYclovir (VALTREX) 500 MG tablet Take by mouth.    Past Medical History:  Diagnosis Date  . Arthritis    hands, back  . Bilateral sciatica   . Cancer ([place])   . Diabetes mellitus without complication ([place])    Diet controlled  . Dysrhythmia    approx 15 yrs had to have heart "stopped and restarted" due to arrhythmia from misaligned discs "pressing on nerves".  . Hyperlipidemia   . Hypertension   . Hypothyroidism   . 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  Both had MRSA. resolved  . Multilevel degenerative disc disease   . Phlebitis    bilateral legs  . Seasonal allergies   . Shingles 2018   Right eye  . Thyroid disease   . Wears dentures    partial lower    Past Surgical History:  Procedure Laterality Date  . ABDOMINAL HYSTERECTOMY    . APPENDECTOMY    . CATARACT EXTRACTION W/PHACO Right 02/13/2020   Procedure: CATARACT EXTRACTION PHACO AND INTRAOCULAR LENS PLACEMENT (IOC) RIGHT 18.82  02:15.0  13.9%;  Surgeon: [name], MD;  Location: [facility];  Service: Ophthalmology;  Laterality: Right;  Diabetic - diet controlled  .  GRAFT APPLICATION    . KNEE ARTHROSCOPY    . TONSILLECTOMY    . TUMOR REMOVAL      Social History Social History   Tobacco Use  . Smoking status: Former Smoker    Quit date: 11/25/1994    Years since quitting: 25.5  . Smokeless tobacco: Never Used  Vaping Use  . Vaping Use: Never used  Substance Use Topics  . Alcohol use: Not Currently  . Drug use: Not on file    Family History Family History  Problem Relation Age of Onset  . Varicose Veins Mother   . Heart disease Mother   . Heart disease Father   . Diabetes Father   . Heart disease Brother   . Diabetes Brother   .  Cancer Daughter     Allergies  Allergen Reactions  . Amoxicillin-Pot Clavulanate Nausea And Vomiting  . Codeine Nausea And Vomiting  . Metformin And Related Diarrhea     REVIEW OF SYSTEMS (Negative unless checked)  Constitutional: [] Weight loss  [] Fever  [] Chills Cardiac: [] Chest pain   [] Chest pressure   [] Palpitations   [] Shortness of breath when laying flat   [] Shortness of breath with exertion. Vascular:  [] Pain in legs with walking   [] Pain in legs at rest  [] History of DVT   [] Phlebitis   [x] Swelling in legs   [] Varicose veins   [] Non-healing ulcers Pulmonary:   [] Uses home oxygen   [] Productive cough   [] Hemoptysis   [] Wheeze  [] COPD   [] Asthma Neurologic:  [] Dizziness   [] Seizures   [] History of stroke   [] History of TIA  [] Aphasia   [] Vissual changes   [] Weakness or numbness in arm   [] Weakness or numbness in leg Musculoskeletal:   [] Joint swelling   [] Joint pain   [] Low back pain Hematologic:  [] Easy bruising  [] Easy bleeding   [] Hypercoagulable state   [] Anemic Gastrointestinal:  [] Diarrhea   [] Vomiting  [] Gastroesophageal reflux/heartburn   [] Difficulty swallowing. Genitourinary:  [] Chronic kidney disease   [] Difficult urination  [] Frequent urination   [] Blood in urine Skin:  [] Rashes   [] Ulcers  Psychological:  [] History of anxiety   []  History of major depression.  Physical Examination  Vitals:   06/23/20 0921  BP: (!) 154/70  Pulse: 67  Weight: 200 lb (90.7 kg)  Height: 5\' 8"  (1.727 m)   Body mass index is 30.41 kg/m. Gen: WD/WN, NAD Head: [place]/AT, No temporalis wasting.  Ear/Nose/Throat: Hearing grossly intact, nares w/o erythema or drainage Eyes: PER, EOMI, sclera nonicteric.  Neck: Supple, no large masses.   Pulmonary:  Good air movement, no audible wheezing bilaterally, no use of accessory muscles.  Cardiac: RRR, no JVD Vascular: scattered varicosities present bilaterally.  Moderate venous stasis changes to the legs bilaterally.  2+ soft pitting  edema. Vessel Right Left  Radial Palpable Palpable  Gastrointestinal: Non-distended. No guarding/no peritoneal signs.  Musculoskeletal: M/S 5/5 throughout.  No deformity or atrophy.  Neurologic: CN 2-12 intact. Symmetrical.  Speech is fluent. Motor exam as listed above. Psychiatric: Judgment intact, Mood & affect appropriate for pt's clinical situation. Dermatologic: No rashes or ulcers noted.  No changes consistent with cellulitis. Lymph : Mild skin changes of chronic lymphedema.  CBC No results found for: WBC, HGB, HCT, MCV, PLT  BMET No results found for: NA, K, CL, CO2, GLUCOSE, BUN, CREATININE, CALCIUM, GFRNONAA, GFRAA CrCl cannot be calculated (No successful lab value found.).  COAG No results found for: INR, PROTIME  Radiology No results found.  Assessment/Plan 1. Chronic venous insufficiency No surgery or intervention at this point in time.    I have reviewed my discussion with the patient regarding venous insufficiency and secondary lymph edema and why it  causes symptoms. I have discussed with the patient the chronic skin changes that accompany these problems and the long term sequela such as ulceration and infection.  Patient will continue wearing graduated compression stockings class 1 (20-30 mmHg) on a daily basis a prescription was given to the patient to keep this updated. The patient will  put the stockings on first thing in the morning and removing them in the evening. The patient is instructed specifically not to sleep in the stockings.  In addition, behavioral modification including elevation during the day will be continued.  Diet and salt restriction was also discussed.  Previous duplex ultrasound of the lower extremities shows normal deep venous system, superficial reflux was not present.   Following the review of the ultrasound the patient will follow up in 12 months to reassess the degree of swelling and the control that graduated compression is offering.    The patient can be assessed for a Lymph Pump at that time.  However, at this time the patient states they are satisfied with the control compression and elevation is yielding.    2. Lymphedema No surgery or intervention at this point in time.    I have reviewed my discussion with the patient regarding venous insufficiency and secondary lymph edema and why it  causes symptoms. I have discussed with the patient the chronic skin changes that accompany these problems and the long term sequela such as ulceration and infection.  Patient will continue wearing graduated compression stockings class 1 (20-30 mmHg) on a daily basis a prescription was given to the patient to keep this updated. The patient will  put the stockings on first thing in the morning and removing them in the evening. The patient is instructed specifically not to sleep in the stockings.  In addition, behavioral modification including elevation during the day will be continued.  Diet and salt restriction was also discussed.  Previous duplex ultrasound of the lower extremities shows normal deep venous system, superficial reflux was not present.   Following the review of the ultrasound the patient will follow up in 12 months to reassess the degree of swelling and the control that graduated compression is offering.   The patient can be assessed for a Lymph Pump at that time.  However, at this time the patient states they are satisfied with the control compression and elevation is yielding.    3. Mixed hyperlipidemia Continue statin as ordered and reviewed, no changes at this time   4. Essential hypertension Continue antihypertensive medications as already ordered, these medications have been reviewed and there are no changes at this time.   5. Type 2 diabetes mellitus without complication, without long-term current use of insulin (HCC) Continue hypoglycemic medications as already ordered, these medications have been reviewed and there  are no changes at this time.  Hgb A1C to be monitored as already arranged by primary service    Hortencia Pilar, MD  06/23/2020 9:24 AM

## 2020-10-20 ENCOUNTER — Other Ambulatory Visit: Payer: Self-pay | Admitting: Infectious Diseases

## 2020-10-20 DIAGNOSIS — Z1231 Encounter for screening mammogram for malignant neoplasm of breast: Secondary | ICD-10-CM

## 2020-12-09 ENCOUNTER — Telehealth (INDEPENDENT_AMBULATORY_CARE_PROVIDER_SITE_OTHER): Payer: Self-pay | Admitting: Vascular Surgery

## 2020-12-09 NOTE — Telephone Encounter (Signed)
I reviewed Dr. Blane Ohara note, she can come in to be seen , no studies, but please re-emphasize compression stockings

## 2020-12-09 NOTE — Telephone Encounter (Signed)
Called stating that her right leg/ankle is swollen and red. Patient has noticed this for about a week. Today she went to her PCP and he prescribed her antibiotics. She would like to be seen. Patient was last seen 06/23/20 with GS (no studies). Please advise.

## 2020-12-09 NOTE — Telephone Encounter (Signed)
I made the pt aware of the Np's instructions. The pt made me aware that the compression stockings make her legs  itch due to the VV. Could you please schedule an appointment with no studies per the NP.

## 2020-12-09 NOTE — Telephone Encounter (Signed)
Pt only wanted to see GS added to his schedule.

## 2020-12-09 NOTE — Telephone Encounter (Signed)
Per DR. Schnier's last note the pt is to continue wearing compression stockings grade 20-30 mg and  follow up in 12 months is this ok to relay to the Pt .

## 2020-12-29 ENCOUNTER — Encounter (INDEPENDENT_AMBULATORY_CARE_PROVIDER_SITE_OTHER): Payer: Self-pay | Admitting: Vascular Surgery

## 2020-12-29 ENCOUNTER — Ambulatory Visit (INDEPENDENT_AMBULATORY_CARE_PROVIDER_SITE_OTHER): Payer: Federal, State, Local not specified - PPO | Admitting: Vascular Surgery

## 2020-12-29 ENCOUNTER — Other Ambulatory Visit: Payer: Self-pay

## 2020-12-29 VITALS — BP 151/65 | HR 80 | Resp 16 | Wt 206.8 lb

## 2020-12-29 DIAGNOSIS — L97319 Non-pressure chronic ulcer of right ankle with unspecified severity: Secondary | ICD-10-CM

## 2020-12-29 DIAGNOSIS — I890 Lymphedema, not elsewhere classified: Secondary | ICD-10-CM

## 2020-12-29 DIAGNOSIS — I872 Venous insufficiency (chronic) (peripheral): Secondary | ICD-10-CM | POA: Diagnosis not present

## 2020-12-29 DIAGNOSIS — E782 Mixed hyperlipidemia: Secondary | ICD-10-CM

## 2020-12-29 DIAGNOSIS — I83013 Varicose veins of right lower extremity with ulcer of ankle: Secondary | ICD-10-CM | POA: Diagnosis not present

## 2020-12-29 DIAGNOSIS — I10 Essential (primary) hypertension: Secondary | ICD-10-CM | POA: Diagnosis not present

## 2020-12-29 NOTE — Progress Notes (Signed)
MRN : [id number]  [name] is a 75 y.o. (1946-02-27) female who presents with chief complaint of  Chief Complaint  Patient presents with  . Follow-up    Bilateral leg swelling  .  History of Present Illness:   Patient is seen for follow up evaluation of right leg pain and swelling associated with right ankle venous ulceration. The patient reports the ulcer began about 3 weeks ago.  She experienced an abrupt increase in her edema which caused the ulcer to open.  The patient notes that an ulcer has developed acutely without specific trauma and since it occurred it has been very slow to heal.  There is a moderate amount of drainage associated with the open area.  The wound is also painful.  The patient notes that she is dressing the ulcer bid with Santyl and then wearing a compression sock over the dressing..  The patient states that they have been elevating as much as possible. The patient denies any recent changes in medications.  The patient denies a history of DVT or PE. There is no prior history of phlebitis. There is no history of primary lymphedema.  No SOB or increased cough.  No sputum production.  No recent episodes of CHF exacerbation.   Current Meds  Medication Sig  . albuterol (VENTOLIN HFA) 108 (90 Base) MCG/ACT inhaler INHALE 2 INHALATIONS INTO THE LUNGS EVERY 6 HOURS AS NEEDED FOR WHEEZING  . amLODipine (NORVASC) 10 MG tablet Take 10 mg by mouth daily.  Marland Kitchen ascorbic acid (VITAMIN C) 500 MG tablet Take by mouth.  . bisoprolol (ZEBETA) 10 MG tablet   . celecoxib (CELEBREX) 200 MG capsule Take by mouth.  . cholecalciferol (VITAMIN D3) 25 MCG (1000 UNIT) tablet Take 1,000 Units by mouth daily.  . colchicine 0.6 MG tablet Take by mouth.  . cyclobenzaprine (FLEXERIL) 10 MG tablet Take by mouth.  . Difluprednate (DUREZOL) 0.05 % EMUL Apply to eye.  . famotidine (PEPCID) 10 MG tablet Take by mouth.  . fluticasone (FLONASE) 50 MCG/ACT nasal spray SHAKE LQ AND U 2  SPRAYS IEN QD PRN  . furosemide (LASIX) 20 MG tablet Take 20 mg by mouth daily.  Marland Kitchen gabapentin (NEURONTIN) 300 MG capsule Take 300-600 mg by mouth 3 (three) times daily.  . hydrALAZINE (APRESOLINE) 50 MG tablet Take by mouth.  . levothyroxine (SYNTHROID) 75 MCG tablet Take by mouth.  . loperamide (IMODIUM A-D) 2 MG tablet Take by mouth.  . losartan (COZAAR) 100 MG tablet Take 100 mg by mouth daily.  . metFORMIN (GLUCOPHAGE) 500 MG tablet Take 500 mg by mouth 2 (two) times daily.  . nepafenac (NEVANAC) 0.1 % ophthalmic suspension   . rosuvastatin (CRESTOR) 10 MG tablet Take by mouth.  Annitta Needs ointment APPLY TOPICALLY TO THE AFFECTED AREA EVERY DAY FOR 10 DAYS  . spironolactone (ALDACTONE) 25 MG tablet Take 25 mg by mouth daily.  . valACYclovir (VALTREX) 500 MG tablet Take by mouth.    Past Medical History:  Diagnosis Date  . Arthritis    hands, back  . Bilateral sciatica   . Cancer ([place])   . Diabetes mellitus without complication ([place])    Diet controlled  . Dysrhythmia    approx 15 yrs had to have heart "stopped and restarted" due to arrhythmia from misaligned discs "pressing on nerves".  . Hyperlipidemia   . Hypertension   . Hypothyroidism   . MRSA (methicillin resistant Staphylococcus aureus) 2012   Ankle tumor removal with graft from  thigh.  Both had MRSA. resolved  . Multilevel degenerative disc disease   . Phlebitis    bilateral legs  . Seasonal allergies   . Shingles 2018   Right eye  . Thyroid disease   . Wears dentures    partial lower    Past Surgical History:  Procedure Laterality Date  . ABDOMINAL HYSTERECTOMY    . APPENDECTOMY    . CATARACT EXTRACTION W/PHACO Right 02/13/2020   Procedure: CATARACT EXTRACTION PHACO AND INTRAOCULAR LENS PLACEMENT (IOC) RIGHT 18.82  02:15.0  13.9%;  Surgeon: [name], MD;  Location: [facility];  Service: Ophthalmology;  Laterality: Right;  Diabetic - diet controlled  . GRAFT APPLICATION    . KNEE ARTHROSCOPY     . TONSILLECTOMY    . TUMOR REMOVAL      Social History Social History   Tobacco Use  . Smoking status: Former Smoker    Quit date: 11/25/1994    Years since quitting: 26.1  . Smokeless tobacco: Never Used  Vaping Use  . Vaping Use: Never used  Substance Use Topics  . Alcohol use: Not Currently    Family History Family History  Problem Relation Age of Onset  . Varicose Veins Mother   . Heart disease Mother   . Heart disease Father   . Diabetes Father   . Heart disease Brother   . Diabetes Brother   . Cancer Daughter     Allergies  Allergen Reactions  . Amoxicillin-Pot Clavulanate Nausea And Vomiting  . Codeine Nausea And Vomiting  . Metformin And Related Diarrhea     REVIEW OF SYSTEMS (Negative unless checked)  Constitutional: [] Weight loss  [] Fever  [] Chills Cardiac: [] Chest pain   [] Chest pressure   [] Palpitations   [] Shortness of breath when laying flat   [] Shortness of breath with exertion. Vascular:  [] Pain in legs with walking   [x] Pain in legs at rest  [] History of DVT   [] Phlebitis   [x] Swelling in legs   [x] Varicose veins   [x] Non-healing ulcers Pulmonary:   [] Uses home oxygen   [] Productive cough   [] Hemoptysis   [] Wheeze  [] COPD   [] Asthma Neurologic:  [] Dizziness   [] Seizures   [] History of stroke   [] History of TIA  [] Aphasia   [] Vissual changes   [] Weakness or numbness in arm   [] Weakness or numbness in leg Musculoskeletal:   [] Joint swelling   [x] Joint pain   [] Low back pain Hematologic:  [] Easy bruising  [] Easy bleeding   [] Hypercoagulable state   [] Anemic Gastrointestinal:  [] Diarrhea   [] Vomiting  [] Gastroesophageal reflux/heartburn   [] Difficulty swallowing. Genitourinary:  [] Chronic kidney disease   [] Difficult urination  [] Frequent urination   [] Blood in urine Skin:  [x] Rashes   [x] Ulcers  Psychological:  [] History of anxiety   []  History of major depression.  Physical Examination  Vitals:   12/29/20 0854  BP: (!) 151/65  Pulse: 80   Resp: 16  Weight: 206 lb 12.8 oz (93.8 kg)   Body mass index is 31.44 kg/m. Gen: WD/WN, NAD Head: [place]/AT, No temporalis wasting.  Ear/Nose/Throat: Hearing grossly intact, nares w/o erythema or drainage Eyes: PER, EOMI, sclera nonicteric.  Neck: Supple, no large masses.   Pulmonary:  Good air movement, no audible wheezing bilaterally, no use of accessory muscles.  Cardiac: RRR, no JVD Vascular: 2-3+ edema of the right leg with severe venous changes of the right leg.  Venous ulcer noted in the lateral ankle area on the right, noninfected Vessel Right Left  Radial Palpable Palpable  PT Palpable Palpable  DP Palpable Palpable  Gastrointestinal: Non-distended. No guarding/no peritoneal signs.  Musculoskeletal: M/S 5/5 throughout.  No deformity or atrophy.  Neurologic: CN 2-12 intact. Symmetrical.  Speech is fluent. Motor exam as listed above. Psychiatric: Judgment intact, Mood & affect appropriate for pt's clinical situation. Dermatologic: Venous stasis dermatitis with ulcers present on the right.  No changes consistent with cellulitis. Lymph : No lichenification or skin changes of chronic lymphedema.  CBC No results found for: WBC, HGB, HCT, MCV, PLT  BMET No results found for: NA, K, CL, CO2, GLUCOSE, BUN, CREATININE, CALCIUM, GFRNONAA, GFRAA CrCl cannot be calculated (No successful lab value found.).  COAG No results found for: INR, PROTIME  Radiology No results found.   Assessment/Plan 1. Venous ulcer of ankle, right ([place]) The patient is currently dressing her wound twice a day.  She is using Santyl.  I have suggested that Bactroban may also be a good choice since there does not appear to be much in the way of eschar or devitalized tissue that would need debriding.  I did talk about an Haematologist but she is familiar with this and wishes to continue using compression and daily dressing changes.  I think this seems very reasonable.  Given that this started with an abrupt change  in her lymphedema and that she has been wearing compression I did discuss with her adding a lymph pump into her regime.  I demonstrated what the lymph pump is pulling it up on the Internet.  She agrees with this and we will move forward with trying to get her a lymphedema pump.  2. Lymphedema Recommend:  No surgery or intervention at this point in time.    I have reviewed my previous discussion with the patient regarding swelling and why it causes symptoms.  Patient will continue wearing graduated compression stockings class 1 (20-30 mmHg) on a daily basis. The patient will  beginning wearing the stockings first thing in the morning and removing them in the evening. The patient is instructed specifically not to sleep in the stockings.    In addition, behavioral modification including several periods of elevation of the lower extremities during the day will be continued.  This was reviewed with the patient during the initial visit.  The patient will also continue routine exercise, especially walking on a daily basis as was discussed during the initial visit.    Despite conservative treatments including graduated compression therapy class 1 and behavioral modification including exercise and elevation the patient  has not obtained adequate control of the lymphedema.  The patient still has stage 3 lymphedema and therefore, I believe that a lymph pump should be added to improve the control of the patient's lymphedema.  Additionally, a lymph pump is warranted because it will reduce the risk of cellulitis and ulceration in the future.  Patient should follow-up in six months    3. Chronic venous insufficiency Recommend:  No surgery or intervention at this point in time.    I have reviewed my previous discussion with the patient regarding swelling and why it causes symptoms.  Patient will continue wearing graduated compression stockings class 1 (20-30 mmHg) on a daily basis. The patient will  beginning  wearing the stockings first thing in the morning and removing them in the evening. The patient is instructed specifically not to sleep in the stockings.    In addition, behavioral modification including several periods of elevation of the lower extremities during the  day will be continued.  This was reviewed with the patient during the initial visit.  The patient will also continue routine exercise, especially walking on a daily basis as was discussed during the initial visit.    Despite conservative treatments including graduated compression therapy class 1 and behavioral modification including exercise and elevation the patient  has not obtained adequate control of the lymphedema.  The patient still has stage 3 lymphedema and therefore, I believe that a lymph pump should be added to improve the control of the patient's lymphedema.  Additionally, a lymph pump is warranted because it will reduce the risk of cellulitis and ulceration in the future.  Patient should follow-up in six months    4. Primary hypertension Continue antihypertensive medications as already ordered, these medications have been reviewed and there are no changes at this time.   5. Mixed hyperlipidemia Continue statin as ordered and reviewed, no changes at this time     Hortencia Pilar, MD  12/29/2020 9:04 AM

## 2021-01-28 ENCOUNTER — Telehealth (INDEPENDENT_AMBULATORY_CARE_PROVIDER_SITE_OTHER): Payer: Self-pay

## 2021-01-28 NOTE — Telephone Encounter (Signed)
Patient left a voicemail stating that started using the lymphedema pump for the first time on yesterday and was not able to tolerate the pressure. The patient called the company and they informed her how to turn the pressure down from 45 to 35. The patient went from 1 hour to 30 minutes twice that day but she was still not able to tolerate and have has small open area at the right ankle. The patient has used some medicine on the open area. I spoke with Eulogio Ditch NP and she recommended for the patient to stop the pump for few days until the open area has healed. The patient should wear compression and after healing she can restart using the pump 30 minutes a day. Patient has been made aware with medical recommendations.

## 2021-04-22 ENCOUNTER — Ambulatory Visit: Payer: Self-pay | Admitting: Dermatology

## 2021-05-21 ENCOUNTER — Ambulatory Visit: Payer: Self-pay | Admitting: Dermatology

## 2021-06-22 ENCOUNTER — Ambulatory Visit (INDEPENDENT_AMBULATORY_CARE_PROVIDER_SITE_OTHER): Payer: Medicare Other | Admitting: Vascular Surgery

## 2021-06-29 ENCOUNTER — Ambulatory Visit (INDEPENDENT_AMBULATORY_CARE_PROVIDER_SITE_OTHER): Payer: Medicare Other | Admitting: Vascular Surgery

## 2021-07-16 ENCOUNTER — Ambulatory Visit (INDEPENDENT_AMBULATORY_CARE_PROVIDER_SITE_OTHER): Payer: Federal, State, Local not specified - PPO | Admitting: Vascular Surgery

## 2021-07-16 ENCOUNTER — Encounter (INDEPENDENT_AMBULATORY_CARE_PROVIDER_SITE_OTHER): Payer: Self-pay | Admitting: Vascular Surgery

## 2021-07-16 ENCOUNTER — Other Ambulatory Visit: Payer: Self-pay

## 2021-07-16 VITALS — BP 144/70 | HR 71 | Resp 16 | Wt 210.0 lb

## 2021-07-16 DIAGNOSIS — G4762 Sleep related leg cramps: Secondary | ICD-10-CM

## 2021-07-16 DIAGNOSIS — I872 Venous insufficiency (chronic) (peripheral): Secondary | ICD-10-CM | POA: Diagnosis not present

## 2021-07-16 DIAGNOSIS — I10 Essential (primary) hypertension: Secondary | ICD-10-CM

## 2021-07-16 DIAGNOSIS — I890 Lymphedema, not elsewhere classified: Secondary | ICD-10-CM | POA: Diagnosis not present

## 2021-07-16 DIAGNOSIS — E119 Type 2 diabetes mellitus without complications: Secondary | ICD-10-CM

## 2021-07-16 DIAGNOSIS — E782 Mixed hyperlipidemia: Secondary | ICD-10-CM

## 2021-07-16 NOTE — Progress Notes (Signed)
MRN : [id number]  [name] is a 75 y.o. (10-14-1946) female who presents with chief complaint of leg swelling.  History of Present Illness:   The patient returns to the office for followup evaluation regarding leg swelling.  The swelling has persisted but with compression and the lymph pump the patient states the swelling is much better controlled. The pain associated with swelling is essentially eliminated. There have not been any interval development of a ulcerations or wounds.  No episodes of cellulitis or infection over the past 12 months  The patient noted problems with using the pump, noting it seemed to give her ulcers when she used it regularly.  Since the previous visit the patient has been wearing graduated compression stockings daily and using the lymph pump on an as needed basis.   The patient is also c/o of painful lower extremities. The pain occurs primarily at night while the patient is in bed.   The patient describes it as a cramping or Charley horse type pain. The patient notes the pain isn't associated with activity and is not very consistent day to day. The pain seems to be variable with time. Typically the pain occurs with varying positions and seems to progress until the leg is stretched. The pain has been progressive over the past several years which has prompted the concern for evaluation. The patient states this inability to walk has a significant negative impact on her quality of life and daily activities.  The patient denies a history of degenerative spine disease.  The patient denies rest pain. The patient denies dangling off the affected extremity during the night for pain relief. There are no open wounds or sores at this time.  No prior vascular interventions or vascular surgeries.  The patient denies amaurosis fugax or recent TIA symptoms. There are no recent neurological changes noted. The patient denies history of DVT, PE or superficial  thrombophlebitis. The patient denies recent episodes of angina or shortness of breath.      No outpatient medications have been marked as taking for the 07/16/21 encounter (Appointment) with Delana Meyer, Dolores Lory, MD.    Past Medical History:  Diagnosis Date   Arthritis    hands, back   Bilateral sciatica    Cancer ([place])    Diabetes mellitus without complication ([place])    Diet controlled   Dysrhythmia    approx 15 yrs had to have heart "stopped and restarted" due to arrhythmia from misaligned discs "pressing on nerves".   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  Both had MRSA. resolved   Multilevel degenerative disc disease    Phlebitis    bilateral legs   Seasonal allergies    Shingles 2018   Right eye   Thyroid disease    Wears dentures    partial lower    Past Surgical History:  Procedure Laterality Date   ABDOMINAL HYSTERECTOMY     APPENDECTOMY     CATARACT EXTRACTION W/PHACO Right 02/13/2020   Procedure: CATARACT EXTRACTION PHACO AND INTRAOCULAR LENS PLACEMENT (IOC) RIGHT 18.82  02:15.0  13.9%;  Surgeon: [name], MD;  Location: [facility];  Service: Ophthalmology;  Laterality: Right;  Diabetic - diet controlled   GRAFT APPLICATION     KNEE ARTHROSCOPY     TONSILLECTOMY     TUMOR REMOVAL      Social History Social History  Tobacco Use   Smoking status: Former    Types: Cigarettes    Quit date: 11/25/1994    Years since quitting: 26.6   Smokeless tobacco: Never  Vaping Use   Vaping Use: Never used  Substance Use Topics   Alcohol use: Not Currently    Family History Family History  Problem Relation Age of Onset   Varicose Veins Mother    Heart disease Mother    Heart disease Father    Diabetes Father    Heart disease Brother    Diabetes Brother    Cancer Daughter     Allergies  Allergen Reactions   Amoxicillin-Pot  Clavulanate Nausea And Vomiting   Codeine Nausea And Vomiting   Metformin And Related Diarrhea     REVIEW OF SYSTEMS (Negative unless checked)  Constitutional: '[]'$ Weight loss  '[]'$ Fever  '[]'$ Chills Cardiac: '[]'$ Chest pain   '[]'$ Chest pressure   '[]'$ Palpitations   '[]'$ Shortness of breath when laying flat   '[]'$ Shortness of breath with exertion. Vascular:  '[]'$ Pain in legs with walking   '[]'$ Pain in legs at rest  '[]'$ History of DVT   '[]'$ Phlebitis   '[x]'$ Swelling in legs   '[]'$ Varicose veins   '[]'$ Non-healing ulcers Pulmonary:   '[]'$ Uses home oxygen   '[]'$ Productive cough   '[]'$ Hemoptysis   '[]'$ Wheeze  '[]'$ COPD   '[]'$ Asthma Neurologic:  '[]'$ Dizziness   '[]'$ Seizures   '[]'$ History of stroke   '[]'$ History of TIA  '[]'$ Aphasia   '[]'$ Vissual changes   '[]'$ Weakness or numbness in arm   '[]'$ Weakness or numbness in leg Musculoskeletal:   '[]'$ Joint swelling   '[]'$ Joint pain   '[]'$ Low back pain Hematologic:  '[]'$ Easy bruising  '[]'$ Easy bleeding   '[]'$ Hypercoagulable state   '[]'$ Anemic Gastrointestinal:  '[]'$ Diarrhea   '[]'$ Vomiting  '[]'$ Gastroesophageal reflux/heartburn   '[]'$ Difficulty swallowing. Genitourinary:  '[]'$ Chronic kidney disease   '[]'$ Difficult urination  '[]'$ Frequent urination   '[]'$ Blood in urine Skin:  '[]'$ Rashes   '[]'$ Ulcers  Psychological:  '[]'$ History of anxiety   '[]'$  History of major depression.  Physical Examination  There were no vitals filed for this visit. There is no height or weight on file to calculate BMI. Gen: WD/WN, NAD Head: [place]/AT, No temporalis wasting.  Ear/Nose/Throat: Hearing grossly intact, nares w/o erythema or drainage, pinna without lesions Eyes: PER, EOMI, sclera nonicteric.  Neck: Supple, no gross masses.  No JVD.  Pulmonary:  Good air movement, no audible wheezing, no use of accessory muscles.  Cardiac: RRR, precordium not hyperdynamic. Vascular:  scattered varicosities present bilaterally.  Moderate venous stasis changes to the legs bilaterally.  2+ soft pitting edema  Vessel Right Left  Radial Palpable Palpable  Gastrointestinal: soft,  non-distended. No guarding/no peritoneal signs.  Musculoskeletal: M/S 5/5 throughout.  No deformity.  Neurologic: CN 2-12 intact. Pain and light touch intact in extremities.  Symmetrical.  Speech is fluent. Motor exam as listed above. Psychiatric: Judgment intact, Mood & affect appropriate for pt's clinical situation. Dermatologic: Venous rashes no ulcers noted.  No changes consistent with cellulitis. Lymph : No lichenification or skin changes of chronic lymphedema.  CBC No results found for: WBC, HGB, HCT, MCV, PLT  BMET No results found for: NA, K, CL, CO2, GLUCOSE, BUN, CREATININE, CALCIUM, GFRNONAA, GFRAA CrCl cannot be calculated (No successful lab value found.).  COAG No results found for: INR, PROTIME  Radiology No results found.   Assessment/Plan 1. Lymphedema  No surgery or intervention at this point in time.    I have reviewed my discussion with the patient regarding lymphedema and why it  causes  symptoms.  Patient will continue wearing graduated compression stockings class 1 (20-30 mmHg) on a daily basis a prescription was given. The patient is reminded to put the stockings on first thing in the morning and removing them in the evening. The patient is instructed specifically not to sleep in the stockings.   In addition, behavioral modification throughout the day will be continued.  This will include frequent elevation (such as in a recliner), use of over the counter pain medications as needed and exercise such as walking.  I have reviewed systemic causes for chronic edema such as liver, kidney and cardiac etiologies and there does not appear to be any significant changes in these organ systems over the past year.  The patient is under the impression that these organ systems are all stable and unchanged.    The patient will continue aggressive use of the  lymph pump.  This will continue to improve the edema control and prevent sequela such as ulcers and infections.   The  patient will follow-up with me on an annual basis.    2. Chronic venous insufficiency  No surgery or intervention at this point in time.    I have reviewed my discussion with the patient regarding lymphedema and why it  causes symptoms.  Patient will continue wearing graduated compression stockings class 1 (20-30 mmHg) on a daily basis a prescription was given. The patient is reminded to put the stockings on first thing in the morning and removing them in the evening. The patient is instructed specifically not to sleep in the stockings.   In addition, behavioral modification throughout the day will be continued.  This will include frequent elevation (such as in a recliner), use of over the counter pain medications as needed and exercise such as walking.  I have reviewed systemic causes for chronic edema such as liver, kidney and cardiac etiologies and there does not appear to be any significant changes in these organ systems over the past year.  The patient is under the impression that these organ systems are all stable and unchanged.    The patient will continue aggressive use of the  lymph pump.  This will continue to improve the edema control and prevent sequela such as ulcers and infections.   The patient will follow-up with me on an annual basis.    3. Nocturnal leg cramps Recommend:  The patient is describing Charley horse type leg cramps. No invasive studies, angiography or surgery at this time.    I have reviewed homeopathic remedies such as Cider vinegar or mustard; placing a bar of soap at the bottom of the bed. Quinine is also an option Magnesium supplementation at bedtime was also reviewed.  The patient should continue walking and begin a more formal exercise program.  The patient should continue antiplatelet therapy and aggressive treatment of the lipid abnormalities  The patient will follow up with me on a PRN basis.    4. Primary hypertension Continue antihypertensive  medications as already ordered, these medications have been reviewed and there are no changes at this time.   5. Type 2 diabetes mellitus without complication, without long-term current use of insulin (HCC) Continue hypoglycemic medications as already ordered, these medications have been reviewed and there are no changes at this time.  Hgb A1C to be monitored as already arranged by primary service   6. Mixed hyperlipidemia Continue statin as ordered and reviewed, no changes at this time     Hortencia Pilar, MD  07/16/2021 12:42 PM

## 2021-07-19 ENCOUNTER — Encounter (INDEPENDENT_AMBULATORY_CARE_PROVIDER_SITE_OTHER): Payer: Self-pay | Admitting: Vascular Surgery

## 2021-07-19 DIAGNOSIS — G4762 Sleep related leg cramps: Secondary | ICD-10-CM | POA: Insufficient documentation

## 2021-07-28 ENCOUNTER — Ambulatory Visit: Payer: Federal, State, Local not specified - PPO | Admitting: Dermatology

## 2021-07-28 ENCOUNTER — Other Ambulatory Visit: Payer: Self-pay

## 2021-07-28 DIAGNOSIS — L817 Pigmented purpuric dermatosis: Secondary | ICD-10-CM

## 2021-07-28 DIAGNOSIS — I872 Venous insufficiency (chronic) (peripheral): Secondary | ICD-10-CM

## 2021-07-28 DIAGNOSIS — B0229 Other postherpetic nervous system involvement: Secondary | ICD-10-CM | POA: Diagnosis not present

## 2021-07-28 DIAGNOSIS — Z85820 Personal history of malignant melanoma of skin: Secondary | ICD-10-CM

## 2021-07-28 DIAGNOSIS — L2489 Irritant contact dermatitis due to other agents: Secondary | ICD-10-CM | POA: Diagnosis not present

## 2021-07-28 MED ORDER — MOMETASONE FUROATE 0.1 % EX CREA: 1.0000 "application " | TOPICAL_CREAM | Freq: Every day | CUTANEOUS | 2 refills | Status: DC | PRN

## 2021-07-28 MED ORDER — FLUOCINONIDE 0.05 % EX SOLN: 1.0000 "application " | CUTANEOUS | 2 refills | Status: AC

## 2021-07-28 NOTE — Patient Instructions (Addendum)
For entire Legs  Recommend OTC [place] Bond Rapid Relief Anti-Itch cream (pramoxine + menthol), CeraVe Anti-itch cream or lotion (pramoxine), or Sarna lotion (Original- menthol + camphor or Sensitive- pramoxine) up to 3 times per day to areas on body that are itchy.  Continue to using mupirocin at any open sores at legs until healed  Use Mometasone apply topically to area on red itchy affected of legs daily as needed  Apply compression stockings in mornings    Topical steroids (such as triamcinolone, fluocinolone, fluocinonide, mometasone, clobetasol, halobetasol, betamethasone, hydrocortisone) can cause thinning and lightening of the skin if they are used for too long in the same area. Your physician has selected the right strength medicine for your problem and area affected on the body. Please use your medication only as directed by your physician to prevent side effects.      Melanoma ABCDEs  Melanoma is the most dangerous type of skin cancer, and is the leading cause of death from skin disease.  You are more likely to develop melanoma if you: Have light-colored skin, light-colored eyes, or red or blond hair Spend a lot of time in the sun Tan regularly, either outdoors or in a tanning bed Have had blistering sunburns, especially during childhood Have a close family member who has had a melanoma Have atypical moles or large birthmarks  Early detection of melanoma is key since treatment is typically straightforward and cure rates are extremely high if we catch it early.   The first sign of melanoma is often a change in a mole or a new dark spot.  The ABCDE system is a way of remembering the signs of melanoma.  A for asymmetry:  The two halves do not match. B for border:  The edges of the growth are irregular. C for color:  A mixture of colors are present instead of an even brown color. D for diameter:  Melanomas are usually (but not always) greater than 27mm - the size of a pencil  eraser. E for evolution:  The spot keeps changing in size, shape, and color.  Please check your skin once per month between visits. You can use a small mirror in front and a large mirror behind you to keep an eye on the back side or your body.   If you see any new or changing lesions before your next follow-up, please call to schedule a visit.  Please continue daily skin protection including broad spectrum sunscreen SPF 30+ to sun-exposed areas, reapplying every 2 hours as needed when you're outdoors.   Staying in the shade or wearing long sleeves, sun glasses (UVA+UVB protection) and wide brim hats (4-inch brim around the entire circumference of the hat) are also recommended for sun protection.    If you have any questions or concerns for your doctor, please call our main line at [phone] and press option 4 to reach your doctor's medical assistant. If no one answers, please leave a voicemail as directed and we will return your call as soon as possible. Messages left after 4 pm will be answered the following business day.   You may also send [place] a message via [place]. We typically respond to MyChart messages within 1-2 business days.  For prescription refills, please ask your pharmacy to contact our office. Our fax number is [phone].  If you have an urgent issue when the clinic is closed that cannot wait until the next business day, you can page your doctor at the number below.  Please note that while we do our best to be available for urgent issues outside of office hours, we are not available 24/7.   If you have an urgent issue and are unable to reach [place], you may choose to seek medical care at your doctor's office, retail clinic, urgent care center, or emergency room.  If you have a medical emergency, please immediately call 911 or go to the emergency department.  Pager Numbers  - Dr. Nehemiah Massed: [phone]  - Dr. Laurence Ferrari: [phone]  - Dr. Nicole Kindred: [phone]  In the  event of inclement weather, please call our main line at [phone] for an update on the status of any delays or closures.  Dermatology Medication Tips: Please keep the boxes that topical medications come in in order to help keep track of the instructions about where and how to use these. Pharmacies typically print the medication instructions only on the boxes and not directly on the medication tubes.   If your medication is too expensive, please contact our office at [phone] option 4 or send [place] a message through [place].   We are unable to tell what your co-pay for medications will be in advance as this is different depending on your insurance coverage. However, we may be able to find a substitute medication at lower cost or fill out paperwork to get insurance to cover a needed medication.   If a prior authorization is required to get your medication covered by your insurance company, please allow [place] 1-2 business days to complete this process.  Drug prices often vary depending on where the prescription is filled and some pharmacies may offer cheaper prices.  The website www.goodrx.com contains coupons for medications through different pharmacies. The prices here do not account for what the cost may be with help from insurance (it may be cheaper with your insurance), but the website can give you the price if you did not use any insurance.  - You can print the associated coupon and take it with your prescription to the pharmacy.  - You may also stop by our office during regular business hours and pick up a GoodRx coupon card.  - If you need your prescription sent electronically to a different pharmacy, notify our office through [facility] or by phone at [phone] option 4.

## 2021-07-28 NOTE — Progress Notes (Signed)
New Patient Visit  Subjective  [name] is a 75 y.o. female who presents for the following: New Patient (Initial Visit) (Patient here today with concerns for spots at legs. 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 Patient reports history melanoma in left lower leg 4 years ago. Patient reports history of tumor in right leg. Patient reports some spots at lower legs. She has been using triamcinolone and clobetasol. Patient reports she has been dealing with spots for a long time. She reports some circulation problems in legs. She is followed by a vein specialist and wears support hose. ).   The following portions of the chart were reviewed this encounter and updated as appropriate:       Review of Systems:  No other skin or systemic complaints except as noted in HPI or Assessment and Plan.  Objective  Well appearing patient in no apparent distress; mood and affect are within normal limits.  A focused examination was performed including lower extremities, forehead, and scalp. Relevant physical exam findings are noted in the Assessment and Plan.  bilateral lower legs Erythematous patches involving the ankle and distal lower leg with associated lower leg edema.  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  Recommend daily compression hose/stockings- easiest to put on first thing in morning, remove at bedtime.  Elevate legs as much as possible. Avoid salt/sodium rich foods.  Patient is followed by vascular surgeon and has been using compression  stockings daily  D/c daily use of TMC 0.1 at legs due to risk skin atrophy, may use prn flares, Do not use clobetasol on legs for same reason.  Start Mometasone cream - apply topically to itchy rash of lower legs qd/bid prn flares. Do not use daily.  Use CeraVe Anti-itch cream for daily moisturizer  Continue Mupirocin ointment - apply to open sores at lower legs and cover with band-aid until healed.     mometasone (ELOCON) 0.1 % cream - bilateral lower legs Apply 1 application topically daily as needed (Rash). To itchy red areas of lower legs. Can also use at forehead occasionally for itch  Schamberg's purpura bilateral lower legs  Benign, due to chronic leg swelling Continue daily compression  Irritant contact dermatitis due to other agents bilateral lower legs  Possibly secondary to rubbing/pressure from lymphedema pumps (Pt states spots get red/itchy after using lymphedema pumps)   Start Mometasone cream - apply topically to red areas qd/bid of lower legs prn itch.  Recommend covering aas legs with thick gauze and coban wrap to protect irritated skin before applying Lymphadema pump to legs      Post herpetic neuralgia forehead and scalp  With Pruritus Vs pruritus from seb derm.  Patient reports frequent itch at scalp and forehead, has h/o severe shingles same area  Samples given today for Head and Shoulders shampoo  Start Fluocinonide 0.05 % external solution apply solutions 1 - 2 times daily at scalp. Do not use at forehead  For itchy areas on forehead instructed patient could use mometasone cream prn for short periods  fluocinonide (LIDEX) 0.05 % external solution - forehead and scalp Apply 1 application topically See admin instructions. Use 1 - 2 times daily at scalp. Do not use at face  History of Skin Cancer  (melanoma)  Scars clear at Lower legs. Observe for recurrence.  Call clinic for new or changing lesions.   Recommend regular skin exams, daily  broad-spectrum spf 30+ sunscreen use, and photoprotection.    patient will sign release of info form from [facility] in [place] [phone]  Return for 2 month follow up on stasis dermatitis of legs .   I, Ruthell Rummage, CMA, am acting as scribe for Brendolyn Patty, MD.  Documentation: I have reviewed the above documentation for accuracy and completeness, and I agree with the above.  Brendolyn Patty MD

## 2021-10-05 ENCOUNTER — Ambulatory Visit: Payer: Federal, State, Local not specified - PPO | Admitting: Dermatology

## 2021-10-05 ENCOUNTER — Other Ambulatory Visit: Payer: Self-pay

## 2021-10-05 DIAGNOSIS — L817 Pigmented purpuric dermatosis: Secondary | ICD-10-CM

## 2021-10-05 DIAGNOSIS — L219 Seborrheic dermatitis, unspecified: Secondary | ICD-10-CM | POA: Diagnosis not present

## 2021-10-05 DIAGNOSIS — I872 Venous insufficiency (chronic) (peripheral): Secondary | ICD-10-CM | POA: Diagnosis not present

## 2021-10-05 DIAGNOSIS — L680 Hirsutism: Secondary | ICD-10-CM

## 2021-10-05 DIAGNOSIS — D692 Other nonthrombocytopenic purpura: Secondary | ICD-10-CM

## 2021-10-05 DIAGNOSIS — D2239 Melanocytic nevi of other parts of face: Secondary | ICD-10-CM

## 2021-10-05 DIAGNOSIS — L853 Xerosis cutis: Secondary | ICD-10-CM

## 2021-10-05 DIAGNOSIS — L821 Other seborrheic keratosis: Secondary | ICD-10-CM

## 2021-10-05 NOTE — Patient Instructions (Addendum)
Gentle Skin Care Guide  1. Bathe no more than once a day.  2. Avoid bathing in hot water  3. Use a mild soap like Dove, Vanicream, Cetaphil, CeraVe. Can use Lever 2000 or Cetaphil antibacterial soap  4. Use soap only where you need it. On most days, use it under your arms, between your legs, and on your feet. Let the water rinse other areas unless visibly dirty.  5. When you get out of the bath/shower, use a towel to gently blot your skin dry, don't rub it.  6. While your skin is still a little damp, apply a moisturizing cream such as Vanicream, CeraVe, Cetaphil, Eucerin, Sarna lotion or plain Vaseline Jelly. For hands apply Neutrogena [place] Hand Cream or Excipial Hand Cream.  7. Reapply moisturizer any time you start to itch or feel dry.  8. Sometimes using free and clear laundry detergents can be helpful. Fabric softener sheets should be avoided. Downy Free & Gentle liquid, or any liquid fabric softener that is free of dyes and perfumes, it acceptable to use  9. If your doctor has given you prescription creams you may apply moisturizers over them    Discuss Spironolactone increase with primary care physician to help treat Hirsutism.     If You Need Anything After Your Visit  If you have any questions or concerns for your doctor, please call our main line at [phone] and press option 4 to reach your doctor's medical assistant. If no one answers, please leave a voicemail as directed and we will return your call as soon as possible. Messages left after 4 pm will be answered the following business day.   You may also send [place] a message via [place]. We typically respond to MyChart messages within 1-2 business days.  For prescription refills, please ask your pharmacy to contact our office. Our fax number is [phone].  If you have an urgent issue when the clinic is closed that cannot wait until the next business day, you can page your doctor at the number below.    Please  note that while we do our best to be available for urgent issues outside of office hours, we are not available 24/7.   If you have an urgent issue and are unable to reach [place], you may choose to seek medical care at your doctor's office, retail clinic, urgent care center, or emergency room.  If you have a medical emergency, please immediately call 911 or go to the emergency department.  Pager Numbers  - Dr. Nehemiah Massed: [phone]  - Dr. Laurence Ferrari: [phone]  - Dr. Nicole Kindred: [phone]  In the event of inclement weather, please call our main line at [phone] for an update on the status of any delays or closures.  Dermatology Medication Tips: Please keep the boxes that topical medications come in in order to help keep track of the instructions about where and how to use these. Pharmacies typically print the medication instructions only on the boxes and not directly on the medication tubes.   If your medication is too expensive, please contact our office at [phone] option 4 or send [place] a message through [place].   We are unable to tell what your co-pay for medications will be in advance as this is different depending on your insurance coverage. However, we may be able to find a substitute medication at lower cost or fill out paperwork to get insurance to cover a needed medication.   If a prior authorization is required to get your  medication covered by your insurance company, please allow [place] 1-2 business days to complete this process.  Drug prices often vary depending on where the prescription is filled and some pharmacies may offer cheaper prices.  The website www.goodrx.com contains coupons for medications through different pharmacies. The prices here do not account for what the cost may be with help from insurance (it may be cheaper with your insurance), but the website can give you the price if you did not use any insurance.  - You can print the associated coupon and take it with  your prescription to the pharmacy.  - You may also stop by our office during regular business hours and pick up a GoodRx coupon card.  - If you need your prescription sent electronically to a different pharmacy, notify our office through [facility] or by phone at [phone] option 4.     Si Usted Necesita Algo Despus de Su Visita  Tambin puede enviarnos un mensaje a travs de Pharmacist, community. Por lo general respondemos a los mensajes de MyChart en el transcurso de 1 a 2 das hbiles.  Para renovar recetas, por favor pida a su farmacia que se ponga en contacto con nuestra oficina. Harland Dingwall de fax es [place] [phone].  Si tiene un asunto urgente cuando la clnica est cerrada y que no puede esperar hasta el siguiente da hbil, puede llamar/localizar a su doctor(a) al nmero que aparece a continuacin.   Por favor, tenga en cuenta que aunque hacemos todo lo posible para estar disponibles para asuntos urgentes fuera del horario de [place], no estamos disponibles las 24 horas del da, los 7 das de la [place].   Si tiene un problema urgente y no puede comunicarse con nosotros, puede optar por buscar atencin mdica  en el consultorio de su doctor(a), en una clnica privada, en un centro de atencin urgente o en una sala de emergencias.  Si tiene Engineering geologist, por favor llame inmediatamente al 911 o vaya a la sala de emergencias.  Nmeros de bper  - Dr. Nehemiah Massed: [phone]  - Dra. Moye: [phone]  - Dra. Nicole Kindred: [phone]  En caso de inclemencias del [place], por favor llame a Johnsie Kindred principal al [phone] para una actualizacin sobre el [place] de cualquier retraso o cierre.  Consejos para la medicacin en dermatologa: Por favor, guarde las cajas en las que vienen los medicamentos de uso tpico para ayudarle a seguir las instrucciones sobre dnde y cmo usarlos. Las farmacias generalmente imprimen las instrucciones del medicamento slo en las cajas y  no directamente en los tubos del [place].   Si su medicamento es muy caro, por favor, pngase en contacto con Zigmund Daniel llamando al [phone] y presione la opcin 4 o envenos un mensaje a travs de Pharmacist, community.   No podemos decirle cul ser su copago por los medicamentos por adelantado ya que esto es diferente dependiendo de la cobertura de su seguro. Sin embargo, es posible que podamos encontrar un medicamento sustituto a Electrical engineer un formulario para que el seguro cubra el medicamento que se considera necesario.   Si se requiere una autorizacin previa para que su compaa de seguros [place] su medicamento, por favor permtanos de 1 a 2 das hbiles para completar este proceso.  Los precios de los medicamentos varan con frecuencia dependiendo del Environmental consultant de dnde se surte la receta y alguna farmacias pueden ofrecer precios ms baratos.  El sitio web www.goodrx.com tiene cupones para medicamentos de Airline pilot. [place]  en cuenta lo que podra costar con la ayuda del seguro (puede ser ms barato con su seguro), pero el sitio web puede darle el precio si no utiliz ningn seguro.  - Puede imprimir el cupn correspondiente y llevarlo con su receta a la farmacia.  - Tambin puede pasar por nuestra oficina durante el horario de atencin regular y recoger una tarjeta de cupones de GoodRx.  - Si necesita que su receta se enve electrnicamente a una farmacia diferente, informe a nuestra oficina a travs de MyChart de [place] o por telfono llamando al [phone] y presione la opcin 4.  

## 2021-10-05 NOTE — Progress Notes (Signed)
Follow-Up Visit   Subjective  [name] is a 75 y.o. female who presents for the following: Stasis dermatitis  (Of the B/L lower legs - currently using Mometasone 0.1% cream QD PRN which has helped some. She has continued to use compressions pumps on her lower legs. ), Lesions (On the face - new, would like checked today, not itchy or bothersome. ), Hair growth on the face (Patient would like to discuss treatment options. ), and post herpectic neuralgia  (Patient currently using H&S and Nizoral shampoo and Fluocinonide solution QD PRN she states that has really helped with itching. ).   The following portions of the chart were reviewed this encounter and updated as appropriate:       Review of Systems:  No other skin or systemic complaints except as noted in HPI or Assessment and Plan.  Objective  Well appearing patient in no apparent distress; mood and affect are within normal limits.  A focused examination was performed including the lower legs and face. Relevant physical exam findings are noted in the Assessment and Plan.  B/L leg Pink scaly plaque on the R lat ankle 4.0 cm. Pink scaly patch on the L lat ankle. Smooth pink plaque on the L lat lower leg consistent with scar.      Face Excessive hair growth of the face.   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  Recommend daily compression hose/stockings- easiest to put on first thing in morning, remove at bedtime.  Elevate legs as much as possible. Avoid salt/sodium rich foods.  Continue Mometasone 0.1% cream to aa's legs QD-BID PRN itchy rash until cleared.  Apply Clobetasol 0.05% cream to aa's pink plaque of the R lat ankle QD-BID x 2-4 weeks. Recheck on  f/up  Continue OTC moisturizer daily.   Continue compression stockings and pumps as prescribed by vascular physician.   Consider biopsy if scaly plaque on the R lat ankle not improved at f/u. Angiodermatitis r/o SCCIS  Related Medications mometasone (ELOCON) 0.1 % cream Apply 1 application topically daily as needed (Rash). To itchy red areas of lower legs. Can also use at forehead occasionally for itch  Hirsutism Face  Likely due to age and post menopausal hormones -  Discussed with patient that Spironolactone (pt is already taking) can help decrease the amount of hair growth on the face. She should discuss with prescribing physician to see if OK to increase to 50 -100 mg po QD.   Seborrheic dermatitis Scalp  Vrs post herpetic neuralgia, improving  Continue Lidex solution to aa's scalp QD/bid  PRN itch   Continue H&S alternating with Nizoral shampoo.   Seborrheic Dermatitis  -  is a chronic persistent rash characterized by pinkness and scaling most commonly of the mid face but also can occur on the scalp (dandruff), ears; mid chest, mid back and groin.  It tends to be exacerbated by stress and cooler weather.  People who have neurologic disease may experience new onset or exacerbation of existing seborrheic dermatitis.  The condition is not curable but treatable and can be controlled.   Seborrheic Keratoses - Stuck-on, waxy, tan-brown papules and/or plaques  - Benign-appearing - Discussed benign etiology and prognosis. - Observe - Call for any changes  Purpura -  Chronic; persistent and recurrent.  Treatable, but not curable. - Violaceous macules and patches - Benign - Related to trauma, age, sun damage and/or use of blood thinners, chronic use of topical and/or oral steroids - Observe - Can use OTC arnica containing moisturizer such as Dermend Bruise Formula if desired - Call for worsening or other concerns  Melanocytic Nevi - R chin  - Pink flesh papules - Benign  appearing on exam today - Observation - Call clinic for new or changing moles - Recommend daily use of broad spectrum spf 30+ sunscreen to sun-exposed areas.   Xerosis - diffuse xerotic patches - recommend gentle, hydrating skin care - gentle skin care handout given  Return for stasis dermatitis recheck in 4-6 weeks.  Luther Redo, CMA, am acting as scribe for Brendolyn Patty, MD .  Documentation: I have reviewed the above documentation for accuracy and completeness, and I agree with the above.  Brendolyn Patty MD

## 2021-10-31 ENCOUNTER — Encounter: Payer: Self-pay | Admitting: Internal Medicine

## 2021-10-31 ENCOUNTER — Other Ambulatory Visit: Payer: Self-pay

## 2021-10-31 ENCOUNTER — Inpatient Hospital Stay
Admission: EM | Admit: 2021-10-31 | Discharge: 2021-11-03 | DRG: 193 | Disposition: A | Discharge status: Home / Self Care | Payer: Federal, State, Local not specified - PPO | Attending: Internal Medicine | Admitting: Internal Medicine

## 2021-10-31 ENCOUNTER — Emergency Department: Payer: Federal, State, Local not specified - PPO

## 2021-10-31 DIAGNOSIS — E785 Hyperlipidemia, unspecified: Secondary | ICD-10-CM | POA: Diagnosis present

## 2021-10-31 DIAGNOSIS — Z79899 Other long term (current) drug therapy: Secondary | ICD-10-CM | POA: Diagnosis not present

## 2021-10-31 DIAGNOSIS — Z79890 Hormone replacement therapy (postmenopausal): Secondary | ICD-10-CM | POA: Diagnosis not present

## 2021-10-31 DIAGNOSIS — Z8249 Family history of ischemic heart disease and other diseases of the circulatory system: Secondary | ICD-10-CM | POA: Diagnosis not present

## 2021-10-31 DIAGNOSIS — Z7984 Long term (current) use of oral hypoglycemic drugs: Secondary | ICD-10-CM

## 2021-10-31 DIAGNOSIS — I10 Essential (primary) hypertension: Secondary | ICD-10-CM | POA: Diagnosis present

## 2021-10-31 DIAGNOSIS — Z87891 Personal history of nicotine dependence: Secondary | ICD-10-CM | POA: Diagnosis not present

## 2021-10-31 DIAGNOSIS — Z90710 Acquired absence of both cervix and uterus: Secondary | ICD-10-CM | POA: Diagnosis not present

## 2021-10-31 DIAGNOSIS — E119 Type 2 diabetes mellitus without complications: Secondary | ICD-10-CM | POA: Diagnosis present

## 2021-10-31 DIAGNOSIS — R0602 Shortness of breath: Secondary | ICD-10-CM | POA: Diagnosis present

## 2021-10-31 DIAGNOSIS — Z833 Family history of diabetes mellitus: Secondary | ICD-10-CM | POA: Diagnosis not present

## 2021-10-31 DIAGNOSIS — J189 Pneumonia, unspecified organism: Secondary | ICD-10-CM | POA: Diagnosis present

## 2021-10-31 DIAGNOSIS — D649 Anemia, unspecified: Secondary | ICD-10-CM | POA: Diagnosis present

## 2021-10-31 DIAGNOSIS — J9601 Acute respiratory failure with hypoxia: Secondary | ICD-10-CM | POA: Diagnosis present

## 2021-10-31 DIAGNOSIS — Z20822 Contact with and (suspected) exposure to covid-19: Secondary | ICD-10-CM | POA: Diagnosis present

## 2021-10-31 DIAGNOSIS — E039 Hypothyroidism, unspecified: Secondary | ICD-10-CM | POA: Diagnosis present

## 2021-10-31 LAB — CBC
HCT: 35.7 % — ABNORMAL LOW (ref 36.0–46.0)
HCT: 35.8 % — ABNORMAL LOW (ref 36.0–46.0)
Hemoglobin: 11.2 g/dL — ABNORMAL LOW (ref 12.0–15.0)
Hemoglobin: 11.2 g/dL — ABNORMAL LOW (ref 12.0–15.0)
MCH: 28.9 pg (ref 26.0–34.0)
MCH: 29.3 pg (ref 26.0–34.0)
MCHC: 31.4 g/dL (ref 30.0–36.0)
MCHC: 31.3 g/dL (ref 30.0–36.0)
MCV: 92.2 fL (ref 80.0–100.0)
MCV: 93.7 fL (ref 80.0–100.0)
Platelets: 196 K/uL (ref 150–400)
Platelets: 206 10*3/uL (ref 150–400)
RBC: 3.87 MIL/uL (ref 3.87–5.11)
RBC: 3.82 MIL/uL — ABNORMAL LOW (ref 3.87–5.11)
RDW: 14.5 % (ref 11.5–15.5)
RDW: 14.6 % (ref 11.5–15.5)
WBC: 11.4 K/uL — ABNORMAL HIGH (ref 4.0–10.5)
WBC: 11.3 10*3/uL — ABNORMAL HIGH (ref 4.0–10.5)
nRBC: 0 % (ref 0.0–0.2)
nRBC: 0 % (ref 0.0–0.2)

## 2021-10-31 LAB — TROPONIN I (HIGH SENSITIVITY)
Troponin I (High Sensitivity): 14 ng/L
Troponin I (High Sensitivity): 15 ng/L

## 2021-10-31 LAB — COMPREHENSIVE METABOLIC PANEL
ALT: 20 U/L (ref 0–44)
AST: 19 U/L (ref 15–41)
Albumin: 3.6 g/dL (ref 3.5–5.0)
Alkaline Phosphatase: 75 U/L (ref 38–126)
Anion gap: 7 (ref 5–15)
BUN: 16 mg/dL (ref 8–23)
CO2: 30 mmol/L (ref 22–32)
Calcium: 8.8 mg/dL — ABNORMAL LOW (ref 8.9–10.3)
Chloride: 98 mmol/L (ref 98–111)
Creatinine, Ser: 0.94 mg/dL (ref 0.44–1.00)
GFR, Estimated: >60 mL/min (ref >60)
Glucose, Bld: 145 mg/dL — ABNORMAL HIGH (ref 70–99)
Potassium: 3.7 mmol/L (ref 3.5–5.1)
Sodium: 135 mmol/L (ref 135–145)
Total Bilirubin: 0.8 mg/dL (ref 0.3–1.2)
Total Protein: 7.6 g/dL (ref 6.5–8.1)

## 2021-10-31 LAB — RETICULOCYTES
Immature Retic Fract: 22.9 % — ABNORMAL HIGH (ref 2.3–15.9)
RBC.: 3.81 MIL/uL — ABNORMAL LOW (ref 3.87–5.11)
Retic Count, Absolute: 94.9 10*3/uL (ref 19.0–186.0)
Retic Ct Pct: 2.5 % (ref 0.4–3.1)

## 2021-10-31 LAB — BRAIN NATRIURETIC PEPTIDE: B Natriuretic Peptide: 270.2 pg/mL — ABNORMAL HIGH (ref 0.0–100.0)

## 2021-10-31 LAB — IRON AND TIBC
Iron: 20 ug/dL — ABNORMAL LOW (ref 28–170)
Saturation Ratios: 6 % — ABNORMAL LOW (ref 10.4–31.8)
TIBC: 323 ug/dL (ref 250–450)
UIBC: 303 ug/dL

## 2021-10-31 LAB — RESP PANEL BY RT-PCR (FLU A&B, COVID) ARPGX2
Influenza A by PCR: NEGATIVE
Influenza B by PCR: NEGATIVE
SARS Coronavirus 2 by RT PCR: NEGATIVE

## 2021-10-31 LAB — LACTIC ACID, PLASMA: Lactic Acid, Venous: 0.9 mmol/L (ref 0.5–1.9)

## 2021-10-31 LAB — CREATININE, SERUM
Creatinine, Ser: 0.89 mg/dL (ref 0.44–1.00)
GFR, Estimated: >60 mL/min (ref >60)

## 2021-10-31 LAB — FERRITIN: Ferritin: 171 ng/mL (ref 11–307)

## 2021-10-31 LAB — FOLATE: Folate: 18.9 ng/mL (ref >5.9)

## 2021-10-31 MED ORDER — METHYLPREDNISOLONE SODIUM SUCC 125 MG IJ SOLR
125.0000 mg | Freq: Once | INTRAMUSCULAR | Status: AC
  Administered 2021-10-31: 21:00:00 125 mg via INTRAVENOUS
  Filled 2021-10-31: qty 2

## 2021-10-31 MED ORDER — ALBUTEROL SULFATE (2.5 MG/3ML) 0.083% IN NEBU
2.5000 mg | INHALATION_SOLUTION | Freq: Four times a day (QID) | RESPIRATORY_TRACT | Status: DC
  Administered 2021-10-31 – 2021-11-03 (×8): 2.5 mg via RESPIRATORY_TRACT
  Filled 2021-10-31 (×9): qty 3

## 2021-10-31 MED ORDER — SODIUM CHLORIDE 0.9 % IV SOLN
2.0000 g | INTRAVENOUS | Status: DC
  Administered 2021-11-01 – 2021-11-03 (×3): 2 g via INTRAVENOUS
  Filled 2021-10-31: qty 20
  Filled 2021-10-31 (×2): qty 2

## 2021-10-31 MED ORDER — ACETAMINOPHEN 650 MG RE SUPP
650.0000 mg | Freq: Four times a day (QID) | RECTAL | Status: DC | PRN
  Filled 2021-10-31: qty 1

## 2021-10-31 MED ORDER — SODIUM CHLORIDE 0.9 % IV SOLN
500.0000 mg | INTRAVENOUS | Status: DC
  Administered 2021-11-01 – 2021-11-02 (×2): 500 mg via INTRAVENOUS
  Filled 2021-10-31: qty 500
  Filled 2021-10-31 (×3): qty 5

## 2021-10-31 MED ORDER — ACETAMINOPHEN 325 MG PO TABS: 650.0000 mg | ORAL_TABLET | Freq: Four times a day (QID) | ORAL | Status: DC | PRN

## 2021-10-31 MED ORDER — HEPARIN SODIUM (PORCINE) 5000 UNIT/ML IJ SOLN
5000.0000 [IU] | Freq: Three times a day (TID) | INTRAMUSCULAR | Status: DC
  Administered 2021-11-01 – 2021-11-02 (×5): 5000 [IU] via SUBCUTANEOUS
  Filled 2021-10-31 (×5): qty 1

## 2021-10-31 MED ORDER — SODIUM CHLORIDE 0.9 % IV SOLN
1.0000 g | Freq: Once | INTRAVENOUS | Status: AC
  Administered 2021-10-31: 17:00:00 1 g via INTRAVENOUS
  Filled 2021-10-31: qty 10

## 2021-10-31 MED ORDER — INSULIN ASPART 100 UNIT/ML IJ SOLN
0.0000 [IU] | Freq: Three times a day (TID) | INTRAMUSCULAR | Status: DC
  Administered 2021-11-01: 10:00:00 8 [IU] via SUBCUTANEOUS
  Filled 2021-10-31 (×3): qty 1

## 2021-10-31 MED ORDER — METHYLPREDNISOLONE SODIUM SUCC 40 MG IJ SOLR
40.0000 mg | Freq: Two times a day (BID) | INTRAMUSCULAR | Status: AC
  Administered 2021-11-01 (×2): 40 mg via INTRAVENOUS
  Filled 2021-10-31 (×3): qty 1

## 2021-10-31 MED ORDER — SODIUM CHLORIDE 0.9 % IV SOLN
500.0000 mg | Freq: Once | INTRAVENOUS | Status: AC
  Administered 2021-10-31: 18:00:00 500 mg via INTRAVENOUS
  Filled 2021-10-31: qty 5

## 2021-10-31 MED ORDER — ALBUTEROL SULFATE (2.5 MG/3ML) 0.083% IN NEBU: 2.5000 mg | INHALATION_SOLUTION | RESPIRATORY_TRACT | Status: DC | PRN

## 2021-10-31 MED ORDER — HYDRALAZINE HCL 20 MG/ML IJ SOLN: 5.0000 mg | INTRAMUSCULAR | Status: DC | PRN

## 2021-10-31 MED ORDER — BISACODYL 5 MG PO TBEC: 5.0000 mg | DELAYED_RELEASE_TABLET | Freq: Every day | ORAL | Status: DC | PRN

## 2021-10-31 MED ORDER — PANTOPRAZOLE SODIUM 40 MG IV SOLR
40.0000 mg | INTRAVENOUS | Status: DC
  Administered 2021-10-31 – 2021-11-02 (×3): 40 mg via INTRAVENOUS
  Filled 2021-10-31 (×3): qty 40

## 2021-10-31 MED ORDER — AMLODIPINE BESYLATE 10 MG PO TABS
10.0000 mg | ORAL_TABLET | Freq: Every day | ORAL | Status: DC
  Administered 2021-11-01 – 2021-11-03 (×3): 10 mg via ORAL
  Filled 2021-10-31 (×2): qty 1
  Filled 2021-10-31: qty 2

## 2021-10-31 MED ORDER — GUAIFENESIN ER 600 MG PO TB12: 600.0000 mg | ORAL_TABLET | Freq: Two times a day (BID) | ORAL | Status: DC | PRN

## 2021-10-31 MED ORDER — SPIRONOLACTONE 25 MG PO TABS
25.0000 mg | ORAL_TABLET | Freq: Every day | ORAL | Status: DC
  Administered 2021-11-01 – 2021-11-03 (×3): 25 mg via ORAL
  Filled 2021-10-31 (×3): qty 1

## 2021-10-31 MED ORDER — LACTATED RINGERS IV BOLUS
1000.0000 mL | Freq: Once | INTRAVENOUS | Status: AC
  Administered 2021-10-31: 18:00:00 1000 mL via INTRAVENOUS

## 2021-10-31 MED ORDER — LOSARTAN POTASSIUM 50 MG PO TABS
100.0000 mg | ORAL_TABLET | Freq: Every day | ORAL | Status: DC
  Administered 2021-11-01 – 2021-11-03 (×3): 100 mg via ORAL
  Filled 2021-10-31 (×3): qty 2

## 2021-10-31 MED ORDER — ROSUVASTATIN CALCIUM 10 MG PO TABS
10.0000 mg | ORAL_TABLET | Freq: Every day | ORAL | Status: DC
  Administered 2021-11-01 – 2021-11-03 (×3): 10 mg via ORAL
  Filled 2021-10-31 (×4): qty 1

## 2021-10-31 MED ORDER — IOHEXOL 350 MG/ML SOLN
75.0000 mL | Freq: Once | INTRAVENOUS | Status: AC | PRN
  Administered 2021-10-31: 18:00:00 75 mL via INTRAVENOUS

## 2021-10-31 MED ORDER — ALLOPURINOL 100 MG PO TABS
100.0000 mg | ORAL_TABLET | Freq: Every day | ORAL | Status: DC
  Administered 2021-11-01 – 2021-11-03 (×3): 100 mg via ORAL
  Filled 2021-10-31 (×3): qty 1

## 2021-10-31 MED ORDER — LEVOTHYROXINE SODIUM 50 MCG PO TABS
75.0000 ug | ORAL_TABLET | Freq: Every day | ORAL | Status: DC
  Administered 2021-11-01 – 2021-11-03 (×3): 75 ug via ORAL
  Filled 2021-10-31 (×2): qty 1
  Filled 2021-10-31: qty 2

## 2021-10-31 MED ORDER — GABAPENTIN 300 MG PO CAPS
300.0000 mg | ORAL_CAPSULE | Freq: Three times a day (TID) | ORAL | Status: DC
Start: 2021-10-31 — End: 2021-11-03
  Administered 2021-11-01 – 2021-11-03 (×8): 300 mg via ORAL
  Filled 2021-10-31 (×8): qty 1

## 2021-10-31 MED ORDER — SODIUM CHLORIDE 0.9 % IV SOLN: INTRAVENOUS | Status: DC

## 2021-10-31 NOTE — ED Triage Notes (Signed)
Pt via POV from home. Pt c/o URI symptoms and SOB. Pt RA sat 81%. Denies pain. Pt husband has also been sick. Pt is A&Ox4 but tachypneic. Denies hx of CHF or COPD

## 2021-10-31 NOTE — H&P (Signed)
History and Physical    [name] ZGY:[id number] DOB: July 31, 1946 DOA: 10/31/2021  PCP: Leonel Ramsay, MD    Patient coming from:  Home    Chief Complaint:  SOB   HPI:  [name] is a 75 y.o. female seen in ed with complaints of SOB x 3 days along with Fever 101 that started 3 days ago. Generalized weakness and no energy.Pt has not taken flu shot.ROS is negative for headache, blurred vision  speech or gait issues, chest pain palpitation, abd pain nausea diarrhea, bleeding.  Pt has past medical history of H/O tobacco abuse, DM II, Htn.  ED Course:  Vitals:   10/31/21 1530 10/31/21 1600 10/31/21 1630 10/31/21 1800  BP: (!) 153/56 (!) 135/49 (!) 101/58 136/61  Pulse: 77 72 71 77  Resp: (!) 24 20 16 19   Temp:      TempSrc:      SpO2: 100% 100% 99% 95%  Weight:      Height:      In the emergency room patient is alert awake oriented dyspneic oxygenating 100% on 2L [place]. Pt meets sepsis criteria with fever, wbc count, infection and rr of 20's. We will check lactic acid.Labs shows glucose of 145 and normal kidney and LFT. BNP of 270 and resp panel negative for influenza and covid.  In ed pt got rocephin and azithromycin.   Review of Systems:  Review of Systems  Constitutional:  Positive for chills, fever and malaise/fatigue.  HENT:  Positive for congestion.   Respiratory:  Positive for cough and shortness of breath.   Cardiovascular:  Positive for leg swelling.    Past Medical History:  Diagnosis Date   Arthritis    hands, back   Bilateral sciatica    Cancer ([place])    Diabetes mellitus without complication ([place])    Diet controlled   Dysrhythmia    approx 15 yrs had to have heart "stopped and restarted" due to arrhythmia from misaligned discs "pressing on nerves".   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  Both had MRSA. resolved    Multilevel degenerative disc disease    Phlebitis    bilateral legs   Seasonal allergies    Shingles 2018   Right eye   Thyroid disease    Wears dentures    partial lower    Past Surgical History:  Procedure Laterality Date   ABDOMINAL HYSTERECTOMY     APPENDECTOMY     CATARACT EXTRACTION W/PHACO Right 02/13/2020   Procedure: CATARACT EXTRACTION PHACO AND INTRAOCULAR LENS PLACEMENT (IOC) RIGHT 18.82  02:15.0  13.9%;  Surgeon: [name], MD;  Location: [facility];  Service: Ophthalmology;  Laterality: Right;  Diabetic - diet controlled   GRAFT APPLICATION     KNEE ARTHROSCOPY     TONSILLECTOMY     TUMOR REMOVAL       reports that she quit smoking about 26 years ago. Her smoking use included cigarettes. She has never used smokeless tobacco. She reports that she does not currently use alcohol. No history on file for drug use.  Allergies  Allergen Reactions   Amoxicillin-Pot Clavulanate Nausea And Vomiting   Codeine Nausea And Vomiting   Metformin And Related Diarrhea    Family History  Problem Relation Age of Onset   Varicose Veins Mother    Heart disease Mother    Heart  disease Father    Diabetes Father    Heart disease Brother    Diabetes Brother    Cancer Daughter     Prior to Admission medications   Medication Sig Start Date End Date Taking? Authorizing Provider  albuterol (VENTOLIN HFA) 108 (90 Base) MCG/ACT inhaler INHALE 2 INHALATIONS INTO THE LUNGS EVERY 6 HOURS AS NEEDED FOR WHEEZING 06/25/20   [provider]  allopurinol (ZYLOPRIM) 300 MG tablet Take by mouth. 10/08/19 10/07/20  [provider]  amLODipine (NORVASC) 10 MG tablet Take 10 mg by mouth daily. 09/10/19   [provider]  ascorbic acid (VITAMIN C) 500 MG tablet Take by mouth.    [provider]  bisoprolol (ZEBETA) 10 MG tablet  10/09/19   [provider]  celecoxib (CELEBREX) 200 MG capsule Take by mouth.    [provider]   cholecalciferol (VITAMIN D3) 25 MCG (1000 UNIT) tablet Take 1,000 Units by mouth daily.    [provider]  colchicine 0.6 MG tablet Take by mouth.    [provider]  cyclobenzaprine (FLEXERIL) 10 MG tablet Take by mouth.    [provider]  Difluprednate (DUREZOL) 0.05 % EMUL Apply to eye. 09/14/18   [provider]  famotidine (PEPCID) 10 MG tablet Take by mouth.    [provider]  fluocinonide (LIDEX) 0.05 % external solution Apply 1 application topically See admin instructions. Use 1 - 2 times daily at scalp. Do not use at face 07/28/21   Brendolyn Patty, MD  fluticasone [facility]) 50 MCG/ACT nasal spray SHAKE LQ AND U 2 SPRAYS IEN QD PRN 05/14/19   [provider]  furosemide (LASIX) 20 MG tablet Take 20 mg by mouth daily. 09/17/19   [provider]  gabapentin (NEURONTIN) 300 MG capsule Take 300-600 mg by mouth 3 (three) times daily. 08/14/19   [provider]  hydrALAZINE (APRESOLINE) 50 MG tablet Take by mouth. 12/21/19 12/29/20  [provider]  levothyroxine (SYNTHROID) 75 MCG tablet Take by mouth. 10/08/19 12/29/20  [provider]  loperamide (IMODIUM A-D) 2 MG tablet Take by mouth.    [provider]  losartan (COZAAR) 100 MG tablet Take 100 mg by mouth daily. 11/25/19   [provider]  metFORMIN (GLUCOPHAGE) 500 MG tablet Take 500 mg by mouth 2 (two) times daily. 08/14/19   [provider]  methylPREDNISolone (MEDROL DOSEPAK) 4 MG TBPK tablet Take by mouth as directed. 06/17/20   [provider]  mometasone (ELOCON) 0.1 % cream Apply 1 application topically daily as needed (Rash). To itchy red areas of lower legs. Can also use at forehead occasionally for itch 07/28/21   Brendolyn Patty, MD  nepafenac [facility]) 0.1 % ophthalmic suspension  02/01/20   [provider]  rosuvastatin (CRESTOR) 10 MG tablet Take by mouth.    [provider]  SANTYL ointment  APPLY TOPICALLY TO THE AFFECTED AREA EVERY DAY FOR 10 DAYS 12/24/20   [provider]  spironolactone (ALDACTONE) 25 MG tablet Take 25 mg by mouth daily. 03/17/20   [provider]  valACYclovir (VALTREX) 500 MG tablet Take by mouth. Patient not taking: Reported on 10/05/2021    [provider]    Physical Exam: Vitals:   10/31/21 1530 10/31/21 1600 10/31/21 1630 10/31/21 1800  BP: (!) 153/56 (!) 135/49 (!) 101/58 136/61  Pulse: 77 72 71 77  Resp: (!) 24 20 16 19   Temp:      TempSrc:  SpO2: 100% 100% 99% 95%  Weight:      Height:       Physical Exam Vitals and nursing note reviewed.  Constitutional:      General: She is not in acute distress.    Appearance: Normal appearance. She is obese. She is not ill-appearing, toxic-appearing or diaphoretic.  HENT:     Head: Normocephalic and atraumatic.     Right Ear: External ear normal.     Left Ear: External ear normal.     Nose: Nose normal.     Mouth/Throat:     Mouth: Mucous membranes are moist.  Eyes:     Extraocular Movements: Extraocular movements intact.     Pupils: Pupils are equal, round, and reactive to light.  Neck:     Vascular: No carotid bruit.  Cardiovascular:     Rate and Rhythm: Normal rate and regular rhythm.     Pulses: Normal pulses.     Heart sounds: Normal heart sounds.  Pulmonary:     Effort: Pulmonary effort is normal.     Breath sounds: Wheezing present.  Abdominal:     General: Bowel sounds are normal.     Palpations: Abdomen is soft.  Musculoskeletal:     Right lower leg: No edema.     Left lower leg: No edema.  Skin:    General: Skin is warm.  Neurological:     General: No focal deficit present.     Mental Status: She is alert and oriented to person, place, and time.  Psychiatric:        Mood and Affect: Mood normal.        Behavior: Behavior normal.    Labs on Admission: I have personally reviewed following labs and imaging studies  No results for input(s):  CKTOTAL, CKMB, TROPONINI in the last 72 hours. Lab Results  Component Value Date   WBC 11.4 (H) 10/31/2021   HGB 11.2 (L) 10/31/2021   HCT 35.7 (L) 10/31/2021   MCV 92.2 10/31/2021   PLT 196 10/31/2021    Recent Labs  Lab 10/31/21 1525  NA 135  K 3.7  CL 98  CO2 30  BUN 16  CREATININE 0.94  CALCIUM 8.8*  PROT 7.6  BILITOT 0.8  ALKPHOS 75  ALT 20  AST 19  GLUCOSE 145*   No results found for: CHOL, HDL, LDLCALC, TRIG No results found for: DDIMER Invalid input(s): POCBNP   COVID-19 Labs No results for input(s): DDIMER, FERRITIN, LDH, CRP in the last 72 hours. Lab Results  Component Value Date   [place] 10/31/2021   [place] NEGATIVE 02/11/2020    Radiological Exams on Admission: CT Angio Chest PE W/Cm &/Or Wo Cm  Result Date: 10/31/2021 CLINICAL DATA:  Pulmonary embolism suspected. High probability. Shortness of breath. EXAM: CT ANGIOGRAPHY CHEST WITH CONTRAST TECHNIQUE: Multidetector CT imaging of the chest was performed using the standard protocol during bolus administration of intravenous contrast. Multiplanar CT image reconstructions and MIPs were obtained to evaluate the vascular anatomy. CONTRAST:  57mL OMNIPAQUE IOHEXOL 350 MG/ML SOLN COMPARISON:  Chest radiography same day FINDINGS: Cardiovascular: Heart size is normal. Coronary artery calcification is present. Aortic atherosclerotic calcification is present. Pulmonary arterial opacification is good. There are no pulmonary emboli. Mediastinum/Nodes: No mediastinal or hilar mass or lymphadenopathy. Small mediastinal nodes are within normal limits. 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 Lungs/Pleura: Medial left upper lobe infiltrate consistent with bronchopneumonia. Follow-up to clearing recommended. Mild upper lobe emphysematous changes.  Few areas of patchy scarring in the right middle lobe and lingula. No pleural effusion. Upper Abdomen: No acute finding. 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 Musculoskeletal: Ordinary thoracic degenerative changes. Review of the MIP images confirms the above findings. IMPRESSION: No pulmonary emboli. Coronary artery calcification. Aortic atherosclerotic calcification. Focal infiltrate in the medial left upper lobe consistent with bronchopneumonia. Follow-up to clearing recommended. Scattered areas of scarring in the right middle lobe and lingula. Aortic Atherosclerosis (ICD10-I70.0) and Emphysema (ICD10-J43.9). Electronically Signed   By: Nelson Chimes M.D.   On: 10/31/2021 18:01   DG Chest Port 1 View  Result Date: 10/31/2021 CLINICAL DATA:  Shortness of breath, URI symptoms, oxygen desaturation, tachypneic EXAM: PORTABLE CHEST 1 VIEW COMPARISON:  Portable exam 1652 hours without priors available for comparison FINDINGS: Normal heart size, mediastinal contours, and pulmonary vascularity. Atherosclerotic calcification aorta. Mild RIGHT basilar atelectasis. Lungs otherwise clear. No infiltrate, pleural effusion, or pneumothorax. Extensive costal cartilaginous calcifications. IMPRESSION: Mild RIGHT basilar atelectasis. Aortic Atherosclerosis (ICD10-I70.0). Electronically Signed   By: Lavonia Dana M.D.   On: 10/31/2021 17:01    EKG: Independently reviewed.  Sinus rhythm 75 with no ST-T wave changes.   Assessment/Plan: Principal Problem:   Acute respiratory failure with hypoxia (HCC) Active Problems:   SOB (shortness of breath)   Community acquired pneumonia   DM2 (diabetes mellitus, type 2) (HCC)   HTN (hypertension)   Hypothyroidism Acute respiratory failure with hypoxia/shortness of breath/community-acquired pneumonia: Patient also has a history of tobacco abuse and I suspect may have an underlying component of COPD. Patient presents today with symptoms of shortness of breath inability to take deep breaths fatigue and dyspnea on exertion for the past few days.  Patient notes that her fever has been 101.3 for  the past 3 days as well.  Today she felt worse with her shortness of breath and decided to come to the hospital.  Denies any history of heart failure although she does see the vascular doctor. We will treat patient with multiple treatment facets: -For community-acquired pneumonia we will continue patient on Rocephin and azithromycin with supplemental oxygen and albuterol MDIs as needed. -For elevated BNP question underlying congestive heart failure patient has been using Lasix at home I do not see echo in the chart we will continue patient on Lasix 20 mg daily.  And obtain a 2D echocardiogram. -For patient's history of tobacco abuse we will start patient on Solu-Medrol and duo nebs.  Pulse oximetry ambulatory check prior to discharge.   Diabetes mellitus type 2: Sliding scale insulin, patient takes metformin.  Hypertension: Blood pressure 136/61, pulse 77, temperature 98.2 F (36.8 C), temperature source Oral, resp. rate 19, height 5\' 8"  (1.727 m), weight 90.7 kg, SpO2 95 %. Med reconciliation is pending we will continue patient on losartan.  Amlodipine.  Hypothyroidism: We will continue patient on levothyroxine and check thyroid function panel with free T4 TSH .  Anemia: Hemoglobin of 11.2 today with no prior to compare. Will monitor with CBC obtain an anemia panel and IV PPI therapy.   DVT prophylaxis:  Eliquis  Code Status:  Full code  Family Communication:  Escajeda,kenneth (Spouse)  [phone] (Mobile)   Disposition Plan:  Home  Consults called:  None  Admission status: Inpatient   Para Skeans MD [facility] [phone] How to contact the [facility] Attending or Consulting provider [place] or covering provider during after hours [place], for this patient.    Check the care team  in [facility] and look for a) attending/consulting [facility] provider listed and b) the [facility] team listed Log into [email or web address] and use [place]'s universal password to access. If you do not have  the password, please contact the hospital operator. Locate the [facility] provider you are looking for under [facility] and page to a number that you can be directly reached. If you still have difficulty reaching the provider, please page the [facility] (Director on Call) for the Hospitalists listed on amion for assistance. [email or web address] Password [facility] 10/31/2021, 7:08 PM

## 2021-10-31 NOTE — ED Notes (Addendum)
This RN assisted pt to ambulate to toilet and nack. Pt tolerated well on 4L temporarily.  Pt had back pain prior to ambulation and O2 sats dropped to mid 80's, I increased O2 from 2L to 4L temporarily and during ambulation sats improved. IPMD notified.

## 2021-10-31 NOTE — ED Provider Notes (Signed)
[facility] Emergency Department Provider Note   ____________________________________________   Event Date/Time   First MD Initiated Contact with Patient 10/31/21 1504     (approximate)  I have reviewed the triage vital signs and the nursing notes.   HISTORY  Chief Complaint Shortness of Breath    HPI [name] is a 75 y.o. female with past medical history of hypertension, hyperlipidemia, diabetes, and hypothyroidism who presents to the ED complaining of shortness of breath.  Patient reports that over the past 3 days she has been dealing with a dry cough and sensation that her chest is "full of phlegm."  She states that she has been increasingly short of breath over this time and states she had a fever of 101 yesterday.  She has been taking Tylenol regularly, including once about an hour prior to arrival.  She has not had any pain in her chest, denies any abdominal pain, nausea, vomiting, or diarrhea.  She deals with chronic swelling in her lower extremities that she states is no worse than usual today.  She reports her husband has been sick with similar symptoms and they have been using Flonase and albuterol with partial relief.  She denies any history of COPD or asthma, has received 2 doses of the COVID-19 vaccine.        Past Medical History:  Diagnosis Date   Arthritis    hands, back   Bilateral sciatica    Cancer ([place])    Diabetes mellitus without complication ([place])    Diet controlled   Dysrhythmia    approx 15 yrs had to have heart "stopped and restarted" due to arrhythmia from misaligned discs "pressing on nerves".   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  Both had MRSA. resolved   Multilevel degenerative disc disease    Phlebitis    bilateral legs   Seasonal allergies    Shingles 2018   Right eye   Thyroid disease     Wears dentures    partial lower    Patient Active Problem List   Diagnosis Date Noted   Nocturnal leg cramps 07/19/2021   Chronic venous insufficiency 11/26/2019   Lymphedema 11/26/2019   Allergic rhinitis 11/26/2019   DM2 (diabetes mellitus, type 2) ([place]) 11/26/2019   Gout, unspecified 11/26/2019   HTN (hypertension) 11/26/2019   Hyperlipidemia 11/26/2019   Hypothyroidism 11/26/2019   Shingles 11/26/2019   Venous ulcer of ankle, right ([place]) 11/26/2019    Past Surgical History:  Procedure Laterality Date   ABDOMINAL HYSTERECTOMY     APPENDECTOMY     CATARACT EXTRACTION W/PHACO Right 02/13/2020   Procedure: CATARACT EXTRACTION PHACO AND INTRAOCULAR LENS PLACEMENT (IOC) RIGHT 18.82  02:15.0  13.9%;  Surgeon: [name], MD;  Location: [facility];  Service: Ophthalmology;  Laterality: Right;  Diabetic - diet controlled   GRAFT APPLICATION     KNEE ARTHROSCOPY     TONSILLECTOMY     TUMOR REMOVAL      Prior to Admission medications   Medication Sig Start Date End Date Taking? Authorizing Provider  albuterol (VENTOLIN HFA) 108 (90 Base) MCG/ACT inhaler INHALE 2 INHALATIONS INTO THE LUNGS EVERY 6 HOURS AS NEEDED FOR WHEEZING 06/25/20   [provider]  allopurinol (ZYLOPRIM) 300 MG tablet Take by mouth. 10/08/19 10/07/20  [provider]  amLODipine (NORVASC) 10 MG tablet Take 10  mg by mouth daily. 09/10/19   [provider]  ascorbic acid (VITAMIN C) 500 MG tablet Take by mouth.    [provider]  bisoprolol (ZEBETA) 10 MG tablet  10/09/19   [provider]  celecoxib (CELEBREX) 200 MG capsule Take by mouth.    [provider]  cholecalciferol (VITAMIN D3) 25 MCG (1000 UNIT) tablet Take 1,000 Units by mouth daily.    [provider]  colchicine 0.6 MG tablet Take by mouth.    [provider]  cyclobenzaprine (FLEXERIL) 10 MG tablet Take by mouth.    [provider]  Difluprednate  (DUREZOL) 0.05 % EMUL Apply to eye. 09/14/18   [provider]  famotidine (PEPCID) 10 MG tablet Take by mouth.    [provider]  fluocinonide (LIDEX) 0.05 % external solution Apply 1 application topically See admin instructions. Use 1 - 2 times daily at scalp. Do not use at face 07/28/21   Brendolyn Patty, MD  fluticasone [facility]) 50 MCG/ACT nasal spray SHAKE LQ AND U 2 SPRAYS IEN QD PRN 05/14/19   [provider]  furosemide (LASIX) 20 MG tablet Take 20 mg by mouth daily. 09/17/19   [provider]  gabapentin (NEURONTIN) 300 MG capsule Take 300-600 mg by mouth 3 (three) times daily. 08/14/19   [provider]  hydrALAZINE (APRESOLINE) 50 MG tablet Take by mouth. 12/21/19 12/29/20  [provider]  levothyroxine (SYNTHROID) 75 MCG tablet Take by mouth. 10/08/19 12/29/20  [provider]  loperamide (IMODIUM A-D) 2 MG tablet Take by mouth.    [provider]  losartan (COZAAR) 100 MG tablet Take 100 mg by mouth daily. 11/25/19   [provider]  metFORMIN (GLUCOPHAGE) 500 MG tablet Take 500 mg by mouth 2 (two) times daily. 08/14/19   [provider]  methylPREDNISolone (MEDROL DOSEPAK) 4 MG TBPK tablet Take by mouth as directed. 06/17/20   [provider]  mometasone (ELOCON) 0.1 % cream Apply 1 application topically daily as needed (Rash). To itchy red areas of lower legs. Can also use at forehead occasionally for itch 07/28/21   Brendolyn Patty, MD  nepafenac [facility]) 0.1 % ophthalmic suspension  02/01/20   [provider]  rosuvastatin (CRESTOR) 10 MG tablet Take by mouth.    [provider]  SANTYL ointment APPLY TOPICALLY TO THE AFFECTED AREA EVERY DAY FOR 10 DAYS 12/24/20   [provider]  spironolactone (ALDACTONE) 25 MG tablet Take 25 mg by mouth daily. 03/17/20   [provider]  valACYclovir (VALTREX) 500 MG tablet Take by mouth. Patient not taking: Reported on  10/05/2021    [provider]    Allergies Amoxicillin-pot clavulanate, Codeine, and Metformin and related  Family History  Problem Relation Age of Onset   Varicose Veins Mother    Heart disease Mother    Heart disease Father    Diabetes Father    Heart disease Brother    Diabetes Brother    Cancer Daughter     Social History Social History   Tobacco Use   Smoking status: Former    Types: Cigarettes    Quit date: 11/25/1994    Years since quitting: 26.9   Smokeless tobacco: Never  Vaping Use   Vaping Use: Never used  Substance Use Topics   Alcohol use: Not Currently    Review of Systems  Constitutional: Positive for fever/chills Eyes: No visual changes. ENT: No sore throat. Cardiovascular: Denies chest pain. Respiratory: Positive  for cough and shortness of breath. Gastrointestinal: No abdominal pain.  No nausea, no vomiting.  No diarrhea.  No constipation. Genitourinary: Negative for dysuria. Musculoskeletal: Negative for back pain. Skin: Negative for rash. Neurological: Negative for headaches, focal weakness or numbness.  ____________________________________________   PHYSICAL EXAM:  VITAL SIGNS: ED Triage Vitals  Enc Vitals Group     BP 10/31/21 1458 (!) 143/62     Pulse Rate 10/31/21 1458 78     Resp 10/31/21 1458 (!) 26     Temp 10/31/21 1458 98.2 F (36.8 C)     Temp Source 10/31/21 1458 Oral     SpO2 10/31/21 1458 (!) 81 %     Weight 10/31/21 1459 200 lb (90.7 kg)     Height 10/31/21 1459 5\' 8"  (1.727 m)     Head Circumference --      Peak Flow --      Pain Score 10/31/21 1459 0     Pain Loc --      Pain Edu? --      Excl. in [place]? --     Constitutional: Alert and oriented. Eyes: Conjunctivae are normal. Head: Atraumatic. Nose: No congestion/rhinnorhea. Mouth/Throat: Mucous membranes are moist. Neck: Normal ROM Cardiovascular: Normal rate, regular rhythm. Grossly normal heart sounds.  2+ radial pulses bilaterally. Respiratory:  Mildly tachypneic with increased respiratory effort.  No retractions. Lungs CTAB. Gastrointestinal: Soft and nontender. No distention. Genitourinary: deferred Musculoskeletal: No lower extremity tenderness, trace pitting edema to bilateral lower extremities. Neurologic:  Normal speech and language. No gross focal neurologic deficits are appreciated. Skin:  Skin is warm, dry and intact. No rash noted. Psychiatric: Mood and affect are normal. Speech and behavior are normal.  ____________________________________________   LABS (all labs ordered are listed, but only abnormal results are displayed)  Labs Reviewed  CBC - Abnormal; Notable for the following components:      Result Value   WBC 11.4 (*)    Hemoglobin 11.2 (*)    HCT 35.7 (*)    All other components within normal limits  COMPREHENSIVE METABOLIC PANEL - Abnormal; Notable for the following components:   Glucose, Bld 145 (*)    Calcium 8.8 (*)    All other components within normal limits  BRAIN NATRIURETIC PEPTIDE - Abnormal; Notable for the following components:   B Natriuretic Peptide 270.2 (*)    All other components within normal limits  RESP PANEL BY RT-PCR (FLU A&B, COVID) ARPGX2  TROPONIN I (HIGH SENSITIVITY)  TROPONIN I (HIGH SENSITIVITY)   ____________________________________________  EKG  ED ECG REPORT I, Blake Divine, the attending physician, personally viewed and interpreted this ECG.   Date: 10/31/2021  EKG Time: 15:19  Rate: 75  Rhythm: normal sinus rhythm  Axis: Normal  Intervals:none  ST&T Change: None   PROCEDURES  Procedure(s) performed (including Critical Care):  .Critical Care Performed by: Blake Divine, MD Authorized by: Blake Divine, MD   Critical care provider statement:    Critical care time (minutes):  45   Critical care time was exclusive of:  Separately billable procedures and treating other patients and teaching time   Critical care was necessary to treat or prevent  imminent or life-threatening deterioration of the following conditions:  Respiratory failure   Critical care was time spent personally by me on the following activities:  Development of treatment plan with patient or surrogate, discussions with consultants, evaluation of patient's response to treatment, examination of patient, ordering and review of laboratory studies, ordering and review  of radiographic studies, ordering and performing treatments and interventions, pulse oximetry, re-evaluation of patient's condition and review of old charts   I assumed direction of critical care for this patient from another provider in my specialty: no     Care discussed with: admitting provider     ____________________________________________   INITIAL IMPRESSION / ASSESSMENT AND PLAN / ED COURSE      75 year old female with past medical history of hypertension, hyperlipidemia, diabetes, and hypothyroidism who presents to the ED with increasing cough, shortness of breath, and fevers over the past 3 days.  Patient noted to be hypoxic in triage to 81% on room air, placed on 2 L nasal cannula with improvement.  She was initially mildly tachypneic with increased work of breathing, although work of breathing seems to be improved with supplemental oxygen in place.  Lungs are clear to auscultation bilaterally, no findings to suggest bronchospasm at this time and suspect pneumonia versus viral illness such as COVID-19.  EKG shows no evidence of arrhythmia or ischemia and I doubt ACS or PE, troponin is pending.  Troponin within normal limits and remainder of labs are unremarkable.  Chest x-ray reviewed by me and shows atelectasis but no focal infiltrate, edema, or effusion.  Patient was further assessed with CTA of chest, which is negative for PE but does show left-sided bronchopneumonia, explaining patient's symptoms.  She was treated with Rocephin and azithromycin along with IV fluid bolus, vital signs are not consistent  with sepsis.  Given her hypoxic respiratory failure, plan to discuss with hospitalist for admission.      ____________________________________________   FINAL CLINICAL IMPRESSION(S) / ED DIAGNOSES  Final diagnoses:  Community acquired pneumonia, unspecified laterality  Acute respiratory failure with hypoxia [facility])     ED Discharge Orders     None        Note:  This document was prepared using Dragon voice recognition software and may include unintentional dictation errors.    Blake Divine, MD 10/31/21 1816

## 2021-11-01 ENCOUNTER — Inpatient Hospital Stay: Admit: 2021-11-01 | Payer: Federal, State, Local not specified - PPO

## 2021-11-01 DIAGNOSIS — J189 Pneumonia, unspecified organism: Secondary | ICD-10-CM | POA: Diagnosis not present

## 2021-11-01 DIAGNOSIS — I10 Essential (primary) hypertension: Secondary | ICD-10-CM | POA: Diagnosis not present

## 2021-11-01 DIAGNOSIS — E119 Type 2 diabetes mellitus without complications: Secondary | ICD-10-CM | POA: Diagnosis not present

## 2021-11-01 DIAGNOSIS — E039 Hypothyroidism, unspecified: Secondary | ICD-10-CM

## 2021-11-01 DIAGNOSIS — J9601 Acute respiratory failure with hypoxia: Secondary | ICD-10-CM | POA: Diagnosis not present

## 2021-11-01 LAB — CBC
HCT: 35.5 % — ABNORMAL LOW (ref 36.0–46.0)
Hemoglobin: 11.2 g/dL — ABNORMAL LOW (ref 12.0–15.0)
MCH: 29.1 pg (ref 26.0–34.0)
MCHC: 31.5 g/dL (ref 30.0–36.0)
MCV: 92.2 fL (ref 80.0–100.0)
Platelets: 187 10*3/uL (ref 150–400)
RBC: 3.85 MIL/uL — ABNORMAL LOW (ref 3.87–5.11)
RDW: 14.6 % (ref 11.5–15.5)
WBC: 10.3 10*3/uL (ref 4.0–10.5)
nRBC: 0 % (ref 0.0–0.2)

## 2021-11-01 LAB — BASIC METABOLIC PANEL
Anion gap: 9 (ref 5–15)
BUN: 18 mg/dL (ref 8–23)
CO2: 28 mmol/L (ref 22–32)
Calcium: 8.5 mg/dL — ABNORMAL LOW (ref 8.9–10.3)
Chloride: 99 mmol/L (ref 98–111)
Creatinine, Ser: 1.05 mg/dL — ABNORMAL HIGH (ref 0.44–1.00)
GFR, Estimated: 56 mL/min — ABNORMAL LOW (ref >60)
Glucose, Bld: 286 mg/dL — ABNORMAL HIGH (ref 70–99)
Potassium: 4 mmol/L (ref 3.5–5.1)
Sodium: 136 mmol/L (ref 135–145)

## 2021-11-01 LAB — RESPIRATORY PANEL BY PCR

## 2021-11-01 LAB — TSH: TSH: 0.689 u[IU]/mL (ref 0.350–4.500)

## 2021-11-01 LAB — TROPONIN I (HIGH SENSITIVITY)
Troponin I (High Sensitivity): 9 ng/L (ref <18)
Troponin I (High Sensitivity): 8 ng/L (ref <18)

## 2021-11-01 LAB — LACTIC ACID, PLASMA
Lactic Acid, Venous: 2.2 mmol/L (ref 0.5–1.9)
Lactic Acid, Venous: 1.8 mmol/L (ref 0.5–1.9)
Lactic Acid, Venous: 1.6 mmol/L (ref 0.5–1.9)

## 2021-11-01 LAB — CBG MONITORING, ED
Glucose-Capillary: 218 mg/dL — ABNORMAL HIGH (ref 70–99)
Glucose-Capillary: 288 mg/dL — ABNORMAL HIGH (ref 70–99)
Glucose-Capillary: 275 mg/dL — ABNORMAL HIGH (ref 70–99)
Glucose-Capillary: 386 mg/dL — ABNORMAL HIGH (ref 70–99)

## 2021-11-01 LAB — VITAMIN B12: Vitamin B-12: 1052 pg/mL — ABNORMAL HIGH (ref 180–914)

## 2021-11-01 MED ORDER — SODIUM CHLORIDE 0.9 % IV SOLN
INTRAVENOUS | Status: DC
Start: 2021-11-01 — End: 2021-11-02

## 2021-11-01 MED ORDER — CELECOXIB 200 MG PO CAPS
200.0000 mg | ORAL_CAPSULE | Freq: Every day | ORAL | Status: DC
  Administered 2021-11-01 – 2021-11-03 (×3): 200 mg via ORAL
  Filled 2021-11-01 (×3): qty 1

## 2021-11-01 NOTE — ED Notes (Signed)
Pt alert, resting in bed at this time.

## 2021-11-01 NOTE — ED Notes (Signed)
Pt unhooked from monitor to wash off as requested

## 2021-11-01 NOTE — ED Notes (Signed)
Pt reporting left shoulder pain after ambulation in room. Repeat EKG performed. Dr Rolin Barry and notified. NAD noted

## 2021-11-01 NOTE — Assessment & Plan Note (Signed)
Continue Rocephin and Zithromax 

## 2021-11-01 NOTE — Assessment & Plan Note (Addendum)
Continue Synthroid, normal TSH

## 2021-11-01 NOTE — ED Notes (Signed)
Critical lactic of 2.2 called from lab.  IPMD notified.

## 2021-11-01 NOTE — ED Notes (Signed)
Pt desat to 83% after ambulation to toilet, placed on 2l [place]

## 2021-11-01 NOTE — ED Notes (Signed)
Pharmacy contacted for missing meds

## 2021-11-01 NOTE — Progress Notes (Signed)
°  Progress Note   Patient: [name] LKJ:[id number] DOB: 04/09/1946 DOA: 10/31/2021     1 DOS: the patient was seen and examined on 11/01/2021   Brief hospital course: 75 year old female with a known history of DJD, diabetes, hypertension, hyperlipidemia, hypothyroidism is admitted for acute hypoxic respiratory failure likely due to community-acquired pneumonia  12/25: Oxygen saturation dropped to 88% when she talks at rest otherwise stays in 90s.  Assessment and Plan * Acute respiratory failure with hypoxia (HCC)- (present on admission) Requiring 2 L oxygen via nasal cannula.  Desaturates easily on talking at 88% per nursing.  Patient had fever up to 101.3 at home.  She is short of breath.  Continue antibiotics for now respiratory panel negative, negative influenza and COVID  Community acquired pneumonia- (present on admission) Continue Rocephin and Zithromax  Hypothyroidism- (present on admission) Continue Synthroid, check TSH  HTN (hypertension)- (present on admission) Continue amlodipine, losartan, spironolactone  DM2 (diabetes mellitus, type 2) ([place]) Continue sliding scale for now    Subjective: Wants to go home.  While talking her oxygen saturation drops to 88%, feels short of breath  Objective Vital signs were reviewed and unremarkable except hypoxia while talking.  75 year old female lying in the bed comfortably without any acute distress Eyes pupil equal round reactive to light and accommodation, no scleral icterus Lungs clear to auscultation bilaterally, no wheezing rales rhonchi or crepitation Cardiovascular S1-S2 normal, no murmur rales or gallop Abdomen soft, benign Neuro alert and oriented, nonfocal Skin no rash or lesion  Data Reviewed: My review of labs, imaging, notes and other tests shows no new significant findings.  Lactic acid trending down now.  Family Communication: None at bedside  Disposition: Status is: Inpatient  Remains inpatient  appropriate because: Still short of breath and hypoxic  DVT prophylaxis subcu heparin   Time spent: 35 minutes  Author: Max Sane 11/01/2021 3:40 PM  For on call review [email or web address].

## 2021-11-01 NOTE — ED Notes (Signed)
This pt's charger retrieved from room 7 by this RN at this time and given to this pt.

## 2021-11-01 NOTE — ED Notes (Signed)
Pt resting with eyes closed, respirations even and unlabored at this time.

## 2021-11-01 NOTE — Progress Notes (Signed)
Pt has requested to reverse DNR and be a full code. She stated she was not aware she was a DNR. MD made aware, DNR band taken off.

## 2021-11-01 NOTE — ED Notes (Signed)
Pt alert and talking to RN at this time.

## 2021-11-01 NOTE — Hospital Course (Addendum)
75 year old female with a known history of DJD, diabetes, hypertension, hyperlipidemia, hypothyroidism is admitted for acute hypoxic respiratory failure likely due to community-acquired pneumonia  12/25: Oxygen saturation dropped to 88% when she talks at rest otherwise stays in 90s. 12/26: Waiting on echo, feeling better.  Trying to wean off oxygen

## 2021-11-01 NOTE — ED Notes (Signed)
Pt ambulatory to restroom

## 2021-11-01 NOTE — Plan of Care (Signed)

## 2021-11-01 NOTE — ED Notes (Signed)
Pt resting with eyes closed, respirations even and unlabored.

## 2021-11-01 NOTE — Assessment & Plan Note (Addendum)
Continue amlodipine, losartan, spironolactone.

## 2021-11-01 NOTE — ED Notes (Addendum)
Pt asking about leaving today AMA, informed pt that she is requiring oxygen, pt states she does not have O2 at home.  Pt states will stay for now  Pt noted to be 88% at rest on RA, pt asked to place [place] back on

## 2021-11-01 NOTE — Assessment & Plan Note (Addendum)
Continue sliding scale for now.

## 2021-11-01 NOTE — Assessment & Plan Note (Addendum)
Nurse is weaning her to room air today.  She still feels some shortness of breath although improved from before.  Patient had fever up to 101.3 at home but none here. Continue antibiotics for now respiratory panel negative, negative influenza and COVID.  Echo pending

## 2021-11-02 ENCOUNTER — Inpatient Hospital Stay
Admit: 2021-11-02 | Discharge: 2021-11-02 | Disposition: A | Discharge status: Home / Self Care | Payer: Federal, State, Local not specified - PPO | Attending: Internal Medicine | Admitting: Internal Medicine

## 2021-11-02 DIAGNOSIS — J9601 Acute respiratory failure with hypoxia: Secondary | ICD-10-CM | POA: Diagnosis not present

## 2021-11-02 DIAGNOSIS — I10 Essential (primary) hypertension: Secondary | ICD-10-CM | POA: Diagnosis not present

## 2021-11-02 DIAGNOSIS — J189 Pneumonia, unspecified organism: Secondary | ICD-10-CM | POA: Diagnosis not present

## 2021-11-02 DIAGNOSIS — E119 Type 2 diabetes mellitus without complications: Secondary | ICD-10-CM | POA: Diagnosis not present

## 2021-11-02 LAB — BASIC METABOLIC PANEL
Anion gap: 5 (ref 5–15)
BUN: 22 mg/dL (ref 8–23)
CO2: 29 mmol/L (ref 22–32)
Calcium: 8.6 mg/dL — ABNORMAL LOW (ref 8.9–10.3)
Chloride: 102 mmol/L (ref 98–111)
Creatinine, Ser: 0.93 mg/dL (ref 0.44–1.00)
GFR, Estimated: >60 mL/min (ref >60)
Glucose, Bld: 287 mg/dL — ABNORMAL HIGH (ref 70–99)
Potassium: 4.3 mmol/L (ref 3.5–5.1)
Sodium: 136 mmol/L (ref 135–145)

## 2021-11-02 LAB — CBC
HCT: 33.1 % — ABNORMAL LOW (ref 36.0–46.0)
Hemoglobin: 10.7 g/dL — ABNORMAL LOW (ref 12.0–15.0)
MCH: 29.3 pg (ref 26.0–34.0)
MCHC: 32.3 g/dL (ref 30.0–36.0)
MCV: 90.7 fL (ref 80.0–100.0)
Platelets: 205 10*3/uL (ref 150–400)
RBC: 3.65 MIL/uL — ABNORMAL LOW (ref 3.87–5.11)
RDW: 14.4 % (ref 11.5–15.5)
WBC: 11 10*3/uL — ABNORMAL HIGH (ref 4.0–10.5)
nRBC: 0 % (ref 0.0–0.2)

## 2021-11-02 LAB — ECHOCARDIOGRAM COMPLETE
AR max vel: 2.43 cm2
AV Area VTI: 2.66 cm2
AV Area mean vel: 2.25 cm2
AV Mean grad: 5 mmHg
AV Peak grad: 8.1 mmHg
Ao pk vel: 1.42 m/s
Area-P 1/2: 5.16 cm2
Height: 68 in
MV VTI: 2.62 cm2
S' Lateral: 2.6 cm
Weight: 3200 oz

## 2021-11-02 LAB — GLUCOSE, CAPILLARY
Glucose-Capillary: 250 mg/dL — ABNORMAL HIGH (ref 70–99)
Glucose-Capillary: 284 mg/dL — ABNORMAL HIGH (ref 70–99)
Glucose-Capillary: 228 mg/dL — ABNORMAL HIGH (ref 70–99)
Glucose-Capillary: 254 mg/dL — ABNORMAL HIGH (ref 70–99)

## 2021-11-02 MED ORDER — ENOXAPARIN SODIUM 40 MG/0.4ML IJ SOSY
40.0000 mg | PREFILLED_SYRINGE | INTRAMUSCULAR | Status: DC
  Administered 2021-11-02: 23:00:00 40 mg via SUBCUTANEOUS
  Filled 2021-11-02: qty 0.4

## 2021-11-02 MED ORDER — SODIUM CHLORIDE 0.9 % IV SOLN: INTRAVENOUS | Status: DC | PRN

## 2021-11-02 MED ORDER — FUROSEMIDE 10 MG/ML IJ SOLN
40.0000 mg | INTRAMUSCULAR | Status: AC
  Administered 2021-11-02: 18:00:00 40 mg via INTRAVENOUS
  Filled 2021-11-02: qty 4

## 2021-11-02 MED ORDER — FUROSEMIDE 20 MG PO TABS: 20.0000 mg | ORAL_TABLET | Freq: Two times a day (BID) | ORAL | Status: DC

## 2021-11-02 NOTE — Progress Notes (Signed)
SATURATION QUALIFICATIONS: (This note is used to comply with regulatory documentation for home oxygen)  Patient Saturations on Room Air at Rest = 96%  Patient Saturations on Room Air while Ambulating = 82%  Patient Saturations on 3 Liters of oxygen while Ambulating = 91%  Please briefly explain why patient needs home oxygen:  Pt c/o SOB and dyspnea while walking. Pt qualifies for home O2

## 2021-11-02 NOTE — Progress Notes (Signed)
*  PRELIMINARY RESULTS* Echocardiogram 2D Echocardiogram has been performed.  Sherrie Sport 11/02/2021, 9:43 AM

## 2021-11-02 NOTE — Progress Notes (Signed)
°  Progress Note   Patient: [name]:[id number] DOB: 1946/10/07 DOA: 10/31/2021     2 DOS: the patient was seen and examined on 11/02/2021   Brief hospital course: 75 year old female with a known history of DJD, diabetes, hypertension, hyperlipidemia, hypothyroidism is admitted for acute hypoxic respiratory failure likely due to community-acquired pneumonia  12/25: Oxygen saturation dropped to 88% when she talks at rest otherwise stays in 90s. 12/26: Waiting on echo, feeling better.  Trying to wean off oxygen  Assessment and Plan * Acute respiratory failure with hypoxia ([place])- (present on admission) Nurse is weaning her to room air today.  She still feels some shortness of breath although improved from before.  Patient had fever up to 101.3 at home but none here. Continue antibiotics for now respiratory panel negative, negative influenza and COVID.  Echo pending  Community acquired pneumonia- (present on admission) Continue Rocephin and Zithromax  Hypothyroidism- (present on admission) Continue Synthroid, normal TSH  HTN (hypertension)- (present on admission) Continue amlodipine, losartan, spironolactone.  DM2 (diabetes mellitus, type 2) (HCC) Continue sliding scale for now.    Subjective: Feeling short of breath but improving.  Getting new IV at bedside.  Still on 2 L oxygen  Objective Vital signs were reviewed and unremarkable.  75 year old female lying in the bed comfortably without any acute distress Eyes pupil equal round reactive to light and accommodation, no scleral icterus Lungs clear to auscultation bilaterally, no wheezing rales rhonchi or crepitation Cardiovascular S1-S2 normal, no murmur rales or gallop Abdomen soft, benign Neuro alert and oriented, nonfocal Skin no rash or lesion  Data Reviewed: My review of labs, imaging, notes and other tests shows no new significant findings.   Family Communication: None at bedside  Disposition: Status is:  Inpatient  Remains inpatient appropriate because: Waiting for echo and symptom improvement, weaning her oxygen off to room air  Likely discharge tomorrow  DVT prophylaxis -Lovenox  Time spent: 35 minutes  Author: Max Sane 11/02/2021 2:18 PM  For on call review [email or web address].

## 2021-11-02 NOTE — Progress Notes (Signed)
°  Transition of Care [facility]) Screening Note   Patient Details  Name: NAIDELIN GUGLIOTTA Date of Birth: 1945-11-30   Transition of Care [facility]) CM/SW Contact:    Alberteen Sam, LCSW Phone Number: 11/02/2021, 9:29 AM    Transition of Care Department [facility]) has reviewed patient and no TOC needs have been identified at this time. We will continue to monitor patient advancement through interdisciplinary progression rounds. If new patient transition needs arise, please place a TOC consult.

## 2021-11-03 DIAGNOSIS — E119 Type 2 diabetes mellitus without complications: Secondary | ICD-10-CM | POA: Diagnosis not present

## 2021-11-03 DIAGNOSIS — I10 Essential (primary) hypertension: Secondary | ICD-10-CM | POA: Diagnosis not present

## 2021-11-03 DIAGNOSIS — J189 Pneumonia, unspecified organism: Secondary | ICD-10-CM | POA: Diagnosis not present

## 2021-11-03 DIAGNOSIS — J9601 Acute respiratory failure with hypoxia: Secondary | ICD-10-CM | POA: Diagnosis not present

## 2021-11-03 LAB — HEMOGLOBIN A1C
Hgb A1c MFr Bld: 7.2 % — ABNORMAL HIGH (ref 4.8–5.6)
Mean Plasma Glucose: 160 mg/dL

## 2021-11-03 LAB — GLUCOSE, CAPILLARY
Glucose-Capillary: 156 mg/dL — ABNORMAL HIGH (ref 70–99)
Glucose-Capillary: 186 mg/dL — ABNORMAL HIGH (ref 70–99)

## 2021-11-03 MED ORDER — LEVOFLOXACIN 750 MG PO TABS: 750.0000 mg | ORAL_TABLET | Freq: Every day | ORAL | 0 refills | Status: AC

## 2021-11-03 MED ORDER — FUROSEMIDE 20 MG PO TABS
40.0000 mg | ORAL_TABLET | Freq: Every day | ORAL | 0 refills | Status: DC
Start: 2021-11-03 — End: 2022-08-10

## 2021-11-03 MED ORDER — FUROSEMIDE 10 MG/ML IJ SOLN
40.0000 mg | INTRAMUSCULAR | Status: AC
  Administered 2021-11-03: 09:00:00 40 mg via INTRAVENOUS
  Filled 2021-11-03: qty 4

## 2021-11-03 MED ORDER — FUROSEMIDE 20 MG PO TABS
20.0000 mg | ORAL_TABLET | Freq: Two times a day (BID) | ORAL | Status: DC
  Administered 2021-11-03: 09:00:00 20 mg via ORAL
  Filled 2021-11-03: qty 1

## 2021-11-03 NOTE — Care Management Important Message (Signed)
Important Message  Patient Details  Name: [name] MRN: [id number] Date of Birth: [birth year]   Medicare Important Message Given:  Yes     Dannette Barbara 11/03/2021, 1:15 PM

## 2021-11-03 NOTE — Discharge Summary (Signed)
Physician Discharge Summary   Patient: [name] MRN: [id number] DOB: @DOB   Admit date:     10/31/2021  Discharge date: 11/03/21  Discharge Physician: Max Sane   PCP: Leonel Ramsay, MD   Recommendations at discharge: 1.  Outpatient follow-up with providers as requested   Discharge Diagnoses Principal Problem:   Acute respiratory failure with hypoxia ([place]) Active Problems:   DM2 (diabetes mellitus, type 2) (HCC)   HTN (hypertension)   Hypothyroidism   Community acquired pneumonia   SOB (shortness of breath)  Hospital Course   75 year old female with a known history of DJD, diabetes, hypertension, hyperlipidemia, hypothyroidism is admitted for acute hypoxic respiratory failure likely due to community-acquired pneumonia  12/25: Oxygen saturation dropped to 88% when she talks at rest otherwise stays in 90s. 12/26: Waiting on echo, feeling better.  Trying to wean off oxygen  * Acute respiratory failure with hypoxia ([place])- (present on admission) Likely due to pneumonia.  Patient is requiring 2 L oxygen during ambulation but wants to go home.  I have requested TOC to set up 2 L oxygen for home for her to be used with ambulation  respiratory panel negative, negative influenza and COVID.  Echo within normal limits  Community acquired pneumonia- (present on admission) Improving with antibiotic.  To finish course at home  Hypothyroidism- (present on admission) Continue Synthroid, normal TSH  HTN (hypertension)- (present on admission) Continue amlodipine, losartan, spironolactone.  DM2 (diabetes mellitus, type 2) ([place]) Resume home regimen at discharge.  Treated with sliding scale while in the hospital  Pain control - [place] was reviewed. and patient was instructed, not to drive, operate heavy machinery, perform activities at heights, swimming or participation in water activities or provide baby-sitting services  while on Pain, Sleep and Anxiety Medications; until their outpatient Physician has advised to do so again. Also recommended to not to take more than prescribed Pain, Sleep and Anxiety Medications.   Disposition: Home Diet recommendation: Cardiac diet  DISCHARGE MEDICATION: Allergies as of 11/03/2021       Reactions   Amoxicillin-pot Clavulanate Nausea And Vomiting   Codeine Nausea And Vomiting   Metformin And Related Diarrhea        Medication List     STOP taking these medications    ascorbic acid 500 MG tablet Commonly known as: VITAMIN C   cyclobenzaprine 10 MG tablet Commonly known as: FLEXERIL   famotidine 10 MG tablet Commonly known as: PEPCID   rosuvastatin 10 MG tablet Commonly known as: CRESTOR   Santyl ointment Generic drug: collagenase   valACYclovir 500 MG tablet Commonly known as: VALTREX       TAKE these medications    albuterol 108 (90 Base) MCG/ACT inhaler Commonly known as: VENTOLIN HFA Inhale 2 puffs into the lungs every 6 (six) hours as needed for wheezing.   allopurinol 300 MG tablet Commonly known as: ZYLOPRIM Take 300 mg by mouth daily.   amLODipine 5 MG tablet Commonly known as: NORVASC Take 5 mg by mouth daily.   bisoprolol 10 MG tablet Commonly known as: ZEBETA Take 10 mg by mouth daily.   celecoxib 200 MG capsule Commonly known as: CELEBREX Take 200 mg by mouth daily.   cholecalciferol 25 MCG (1000 UNIT) tablet Commonly known as: VITAMIN D3 Take 1,000 Units by mouth daily.   colchicine 0.6 MG tablet Take 0.6 mg by mouth as directed.   Durezol 0.05 % Emul Generic drug: Difluprednate Place 1 drop into  the right eye 2 (two) times a week.   fluocinonide 0.05 % external solution Commonly known as: LIDEX Apply 1 application topically See admin instructions. Use 1 - 2 times daily at scalp. Do not use at face   furosemide 20 MG tablet Commonly known as: LASIX Take 2 tablets (40 mg total) by mouth daily. What changed:  how much to take   gabapentin 300 MG capsule Commonly known as: NEURONTIN Take 300-600 mg by mouth 3 (three) times daily.   glimepiride 1 MG tablet Commonly known as: AMARYL Take 1 mg by mouth daily.   hydrALAZINE 50 MG tablet Commonly known as: APRESOLINE Take 50 mg by mouth 3 (three) times daily.   levofloxacin 750 MG tablet Commonly known as: Levaquin Take 1 tablet (750 mg total) by mouth daily for 2 days.   levothyroxine 75 MCG tablet Commonly known as: SYNTHROID TAKE 1 TABLET(75 MCG) BY MOUTH EVERY DAY 30 TO 60 MINUTES BEFORE BREAKFAST ON AN EMPTY STOMACH AND WITH A GLASS OF WATER   loperamide 2 MG tablet Commonly known as: IMODIUM A-D Take 2 mg by mouth 3 (three) times daily as needed for diarrhea or loose stools.   losartan 100 MG tablet Commonly known as: COZAAR Take 100 mg by mouth daily.   mometasone 0.1 % cream Commonly known as: ELOCON Apply 1 application topically daily as needed (Rash). To itchy red areas of lower legs. Can also use at forehead occasionally for itch               Durable Medical Equipment  (From admission, onward)           Start     Ordered   11/03/21 1142  For home use only DME oxygen  Once       Question Answer Comment  Length of Need 6 Months   Mode or (Route) Nasal cannula   Liters per Minute 2   Frequency Continuous (stationary and portable oxygen unit needed)   Oxygen conserving device Yes   Oxygen delivery system Gas      11/03/21 1141            Follow-up Information     Leonel Ramsay, MD. Schedule an appointment as soon as possible for a visit in 2 day(s).   Specialty: Infectious Diseases Why: [facility] Discharge F/UP, If symptoms worsen Contact information: [place] [address] [phone]                 Discharge Exam: Danley Danker Weights   10/31/21 1459  Weight: 89.69 kg   75 year old female lying in the bed comfortably without any acute distress Eyes pupil  equal round reactive to light and accommodation, no scleral icterus Lungs clear to auscultation bilaterally, no wheezing rales rhonchi or crepitation Cardiovascular S1-S2 normal, no murmur rales or gallop Abdomen soft, benign Neuro alert and oriented, nonfocal Skin no rash or lesion  Condition at discharge: good  The results of significant diagnostics from this hospitalization (including imaging, microbiology, ancillary and laboratory) are listed below for reference.   Imaging Studies: CT Angio Chest PE W/Cm &/Or Wo Cm  Result Date: 10/31/2021 CLINICAL DATA:  Pulmonary embolism suspected. High probability. Shortness of breath. EXAM: CT ANGIOGRAPHY CHEST WITH CONTRAST TECHNIQUE: Multidetector CT imaging of the chest was performed using the standard protocol during bolus administration of intravenous contrast. Multiplanar CT image reconstructions and MIPs were obtained to evaluate the vascular anatomy. CONTRAST:  79mL OMNIPAQUE IOHEXOL 350 MG/ML SOLN COMPARISON:  Chest radiography same day FINDINGS: Cardiovascular: Heart size is normal. Coronary artery calcification is present. Aortic atherosclerotic calcification is present. Pulmonary arterial opacification is good. There are no pulmonary emboli. Mediastinum/Nodes: No mediastinal or hilar mass or lymphadenopathy. Small mediastinal nodes are within normal limits. 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 Lungs/Pleura: Medial left upper lobe infiltrate consistent with bronchopneumonia. Follow-up to clearing recommended. Mild upper lobe emphysematous changes. Few areas of patchy scarring in the right middle lobe and lingula. No pleural effusion. Upper Abdomen: No acute finding. 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 Musculoskeletal: Ordinary thoracic degenerative changes. Review of the MIP images confirms the above findings. IMPRESSION: No pulmonary emboli. Coronary artery calcification. Aortic atherosclerotic  calcification. Focal infiltrate in the medial left upper lobe consistent with bronchopneumonia. Follow-up to clearing recommended. Scattered areas of scarring in the right middle lobe and lingula. Aortic Atherosclerosis (ICD10-I70.0) and Emphysema (ICD10-J43.9). Electronically Signed   By: Nelson Chimes M.D.   On: 10/31/2021 18:01   DG Chest Port 1 View  Result Date: 10/31/2021 CLINICAL DATA:  Shortness of breath, URI symptoms, oxygen desaturation, tachypneic EXAM: PORTABLE CHEST 1 VIEW COMPARISON:  Portable exam 1652 hours without priors available for comparison FINDINGS: Normal heart size, mediastinal contours, and pulmonary vascularity. Atherosclerotic calcification aorta. Mild RIGHT basilar atelectasis. Lungs otherwise clear. No infiltrate, pleural effusion, or pneumothorax. Extensive costal cartilaginous calcifications. IMPRESSION: Mild RIGHT basilar atelectasis. Aortic Atherosclerosis (ICD10-I70.0). Electronically Signed   By: Lavonia Dana M.D.   On: 10/31/2021 17:01   ECHOCARDIOGRAM COMPLETE  Result Date: 11/02/2021    ECHOCARDIOGRAM REPORT   Patient Name:   SHAKIMA NISLEY [facility] Date of Exam: 11/02/2021 Medical Rec #:  [id number]        Height:       68.0 in Accession #:    [id number]       Weight:       200.0 lb Date of Birth:  1946-05-05       BSA:          2.044 m Patient Age:    29 years         BP:           139/52 mmHg Patient Gender: F                HR:           76 bpm. Exam Location:  [facility] Procedure: 2D Echo, Cardiac Doppler, Color Doppler and Strain Analysis Indications:     Acute respiratory distress R06.03                  Shortness of breath  History:         Patient has no prior history of Echocardiogram examinations.                  Risk Factors:Diabetes, Hypertension and Dyslipidemia.  Sonographer:     Sherrie Sport Referring Phys:  Badger Lee Diagnosing Phys: Yolonda [name] MD  Sonographer Comments: Global longitudinal strain was attempted. IMPRESSIONS  1. Left ventricular  ejection fraction, by estimation, is 65 to 70%. The left ventricle has normal function. The left ventricle has no regional wall motion abnormalities. Left ventricular diastolic parameters were normal.  2. Right ventricular systolic function is normal. The right ventricular size is mildly enlarged.  3. The mitral valve is normal in structure. No evidence of mitral valve regurgitation.  4. The aortic valve is normal in structure. Aortic valve  regurgitation is not visualized. FINDINGS  Left Ventricle: Left ventricular ejection fraction, by estimation, is 65 to 70%. The left ventricle has normal function. The left ventricle has no regional wall motion abnormalities. The left ventricular internal cavity size was normal in size. There is  no left ventricular hypertrophy. Left ventricular diastolic parameters were normal. Right Ventricle: The right ventricular size is mildly enlarged. No increase in right ventricular wall thickness. Right ventricular systolic function is normal. Left Atrium: Left atrial size was normal in size. Right Atrium: Right atrial size was normal in size. Pericardium: There is no evidence of pericardial effusion. Mitral Valve: The mitral valve is normal in structure. No evidence of mitral valve regurgitation. MV peak gradient, 8.9 mmHg. The mean mitral valve gradient is 3.0 mmHg. Tricuspid Valve: The tricuspid valve is normal in structure. Tricuspid valve regurgitation is trivial. Aortic Valve: The aortic valve is normal in structure. Aortic valve regurgitation is not visualized. Aortic valve mean gradient measures 5.0 mmHg. Aortic valve peak gradient measures 8.1 mmHg. Aortic valve area, by VTI measures 2.66 cm. Pulmonic Valve: The pulmonic valve was normal in structure. Pulmonic valve regurgitation is not visualized. Aorta: The ascending aorta was not well visualized. IAS/Shunts: No atrial level shunt detected by color flow Doppler.  LEFT VENTRICLE PLAX 2D LVIDd:         4.50 cm   Diastology  LVIDs:         2.60 cm   LV e' medial:    7.40 cm/s LV PW:         1.10 cm   LV E/e' medial:  16.2 LV IVS:        0.80 cm   LV e' lateral:   7.94 cm/s LVOT diam:     2.00 cm   LV E/e' lateral: 15.1 LV SV:         81 LV SV Index:   40 LVOT Area:     3.14 cm  RIGHT VENTRICLE RV Basal diam:  3.90 cm RV S prime:     14.90 cm/s TAPSE (M-mode): 4.3 cm LEFT ATRIUM             Index        RIGHT ATRIUM           Index LA diam:        3.60 cm 1.76 cm/m   RA Area:     11.80 cm LA Vol (A2C):   65.7 ml 32.15 ml/m  RA Volume:   24.30 ml  11.89 ml/m LA Vol (A4C):   61.5 ml 30.09 ml/m LA Biplane Vol: 64.2 ml 31.41 ml/m  AORTIC VALVE                    PULMONIC VALVE AV Area (Vmax):    2.43 cm     PV Vmax:        0.63 m/s AV Area (Vmean):   2.25 cm     PV Vmean:       44.600 cm/s AV Area (VTI):     2.66 cm     PV VTI:         0.125 m AV Vmax:           142.00 cm/s  PV Peak grad:   1.6 mmHg AV Vmean:          99.600 cm/s  PV Mean grad:   1.0 mmHg AV VTI:            0.305  m      RVOT Peak grad: 3 mmHg AV Peak Grad:      8.1 mmHg AV Mean Grad:      5.0 mmHg LVOT Vmax:         110.00 cm/s LVOT Vmean:        71.400 cm/s LVOT VTI:          0.258 m LVOT/AV VTI ratio: 0.85  AORTA Ao Root diam: 2.50 cm MITRAL VALVE                TRICUSPID VALVE MV Area (PHT): 5.16 cm     TR Peak grad:   55.7 mmHg MV Area VTI:   2.62 cm     TR Vmax:        373.00 cm/s MV Peak grad:  8.9 mmHg MV Mean grad:  3.0 mmHg     SHUNTS MV Vmax:       1.49 m/s     Systemic VTI:  0.26 m MV Vmean:      85.7 cm/s    Systemic Diam: 2.00 cm MV Decel Time: 147 msec     Pulmonic VTI:  0.155 m MV E velocity: 120.00 cm/s MV A velocity: 93.40 cm/s MV E/A ratio:  1.28 Yolonda [name] MD Electronically signed by Yolonda [name] MD Signature Date/Time: 11/02/2021/2:02:49 PM    Final     Microbiology: Results for orders placed or performed during the hospital encounter of 10/31/21  Resp Panel by RT-PCR (Flu A&B, Covid) Nasopharyngeal Swab     Status: None    Collection Time: 10/31/21  3:25 PM   Specimen: Nasopharyngeal Swab; Nasopharyngeal(NP) swabs in vial transport medium  Result Value Ref Range Status   SARS Coronavirus 2 by RT PCR NEGATIVE NEGATIVE Final    Comment: (NOTE) SARS-CoV-2 target nucleic acids are NOT DETECTED.  The SARS-CoV-2 RNA is generally detectable in upper respiratory specimens during the acute phase of infection. The lowest concentration of SARS-CoV-2 viral copies this assay can detect is 138 copies/mL. A negative result does not preclude SARS-Cov-2 infection and should not be used as the sole basis for treatment or other patient management decisions. A negative result may occur with  improper specimen collection/handling, submission of specimen other than nasopharyngeal swab, presence of viral mutation(s) within the areas targeted by this assay, and inadequate number of viral copies(<138 copies/mL). A negative result must be combined with clinical observations, patient history, and epidemiological information. The expected result is Negative.  Fact Sheet for Patients:  EntrepreneurPulse.com.au  Fact Sheet for Healthcare Providers:  IncredibleEmployment.be  This test is no t yet approved or cleared by the [place] FDA and  has been authorized for detection and/or diagnosis of SARS-CoV-2 by FDA under an Emergency Use Authorization (EUA). This EUA will remain  in effect (meaning this test can be used) for the duration of the COVID-19 declaration under Section 564(b)(1) of the Act, 21 U.S.C.section 360bbb-3(b)(1), unless the authorization is terminated  or revoked sooner.       Influenza A by PCR NEGATIVE NEGATIVE Final   Influenza B by PCR NEGATIVE NEGATIVE Final    Comment: (NOTE) The Xpert Xpress SARS-CoV-2/FLU/RSV plus assay is intended as an aid in the diagnosis of influenza from Nasopharyngeal swab specimens and should not be used as a sole basis for treatment.  Nasal washings and aspirates are unacceptable for Xpert Xpress SARS-CoV-2/FLU/RSV testing.  Fact Sheet for Patients: EntrepreneurPulse.com.au  Fact Sheet for Healthcare Providers: IncredibleEmployment.be  This test is  not yet approved or cleared by the [place] and has been authorized for detection and/or diagnosis of SARS-CoV-2 by FDA under an Emergency Use Authorization (EUA). This EUA will remain in effect (meaning this test can be used) for the duration of the COVID-19 declaration under Section 564(b)(1) of the Act, 21 U.S.C. section 360bbb-3(b)(1), unless the authorization is terminated or revoked.  Performed at [facility], [place], [place] [address]   Respiratory (~20 pathogens) panel by PCR     Status: None   Collection Time: 11/01/21  8:56 AM   Specimen: Nasopharyngeal Swab; Respiratory  Result Value Ref Range Status   Adenovirus NOT DETECTED NOT DETECTED Final   Coronavirus 229E NOT DETECTED NOT DETECTED Final    Comment: (NOTE) The Coronavirus on the Respiratory Panel, DOES NOT test for the novel  Coronavirus (2019 nCoV)    Coronavirus HKU1 NOT DETECTED NOT DETECTED Final   Coronavirus NL63 NOT DETECTED NOT DETECTED Final   Coronavirus OC43 NOT DETECTED NOT DETECTED Final   Metapneumovirus NOT DETECTED NOT DETECTED Final   Rhinovirus / Enterovirus NOT DETECTED NOT DETECTED Final   Influenza A NOT DETECTED NOT DETECTED Final   Influenza B NOT DETECTED NOT DETECTED Final   Parainfluenza Virus 1 NOT DETECTED NOT DETECTED Final   Parainfluenza Virus 2 NOT DETECTED NOT DETECTED Final   Parainfluenza Virus 3 NOT DETECTED NOT DETECTED Final   Parainfluenza Virus 4 NOT DETECTED NOT DETECTED Final   Respiratory Syncytial Virus NOT DETECTED NOT DETECTED Final   Bordetella pertussis NOT DETECTED NOT DETECTED Final   Bordetella Parapertussis NOT DETECTED NOT DETECTED Final   Chlamydophila pneumoniae NOT  DETECTED NOT DETECTED Final   Mycoplasma pneumoniae NOT DETECTED NOT DETECTED Final    Comment: Performed at [facility], [place], [place], [place] [address]    Labs: CBC: Recent Labs  Lab 10/31/21 1525 10/31/21 1533 11/01/21 0500 11/02/21 0541  WBC 11.4* 11.3* 10.3 11.0*  HGB 11.2* 11.2* 11.2* 10.7*  HCT 35.7* 35.8* 35.5* 33.1*  MCV 92.2 93.7 92.2 90.7  PLT 196 206 187 100   Basic Metabolic Panel: Recent Labs  Lab 10/31/21 1525 10/31/21 1533 11/01/21 0500 11/02/21 0541  NA 135  --  136 136  K 3.7  --  4.0 4.3  CL 98  --  99 102  CO2 30  --  28 29  GLUCOSE 145*  --  286* 287*  BUN 16  --  18 22  CREATININE 0.94 0.89 1.05* 0.93  CALCIUM 8.8*  --  8.5* 8.6*   Liver Function Tests: Recent Labs  Lab 10/31/21 1525  AST 19  ALT 20  ALKPHOS 75  BILITOT 0.8  PROT 7.6  ALBUMIN 3.6   CBG: Recent Labs  Lab 11/02/21 1225 11/02/21 1621 11/02/21 2045 11/03/21 0817 11/03/21 1149  GLUCAP 284* 228* 254* 156* 186*    Discharge time spent: greater than 30 minutes.  Signed:  Max Sane MD.  [facility] 11/03/2021

## 2021-11-03 NOTE — Progress Notes (Signed)
SATURATION QUALIFICATIONS: (This note is used to comply with regulatory documentation for home oxygen)  Patient Saturations on Room Air at Rest = 94%  Patient Saturations on Room Air while Ambulating = 73%  Patient Saturations on 2l Liters of oxygen while Ambulating = 91 %

## 2021-11-03 NOTE — TOC Transition Note (Signed)
Transition of Care [facility]) - CM/SW Discharge Note   Patient Details  Name: TRANIYA PRICHETT MRN: [id number] Date of Birth: Aug 29, 1946  [facility] CM/SW Contact:  Alberteen Sam, LCSW Phone Number: 11/03/2021, 12:12 PM   Clinical Narrative:     Patient to discharge home today with oxygen needs. CSW has contacted Suanne Marker with [employer], oxygen to be delivered to hospital room prior to discharge with home O2 set up to follow.   No further discharge needs identified right now.    Final next level of care: Home/Self Care     Patient Goals and CMS Choice   CMS Medicare.gov Compare Post Acute Care list provided to:: Patient Choice offered to / list presented to : Patient  Discharge Placement                       Discharge Plan and Services                DME Arranged: Oxygen DME Agency: [employer] Date DME Agency Contacted: 11/03/21   Representative spoke with at DME Agency: Suanne Marker            Social Determinants of Health ([place]) Interventions     Readmission Risk Interventions No flowsheet data found.

## 2021-11-05 LAB — FUNGITELL, SERUM: Fungitell Result: <31 pg/mL (ref <80)

## 2021-11-06 ENCOUNTER — Other Ambulatory Visit (HOSPITAL_COMMUNITY): Payer: Self-pay | Admitting: Family Medicine

## 2021-11-06 ENCOUNTER — Other Ambulatory Visit: Payer: Self-pay | Admitting: Family Medicine

## 2021-11-06 DIAGNOSIS — J189 Pneumonia, unspecified organism: Secondary | ICD-10-CM

## 2021-11-13 ENCOUNTER — Other Ambulatory Visit: Payer: Self-pay

## 2021-11-13 ENCOUNTER — Emergency Department: Payer: Federal, State, Local not specified - PPO

## 2021-11-13 DIAGNOSIS — I10 Essential (primary) hypertension: Secondary | ICD-10-CM | POA: Diagnosis not present

## 2021-11-13 DIAGNOSIS — E119 Type 2 diabetes mellitus without complications: Secondary | ICD-10-CM | POA: Insufficient documentation

## 2021-11-13 DIAGNOSIS — R531 Weakness: Secondary | ICD-10-CM | POA: Insufficient documentation

## 2021-11-13 LAB — URINALYSIS, ROUTINE W REFLEX MICROSCOPIC
Bilirubin Urine: NEGATIVE
Glucose, UA: NEGATIVE mg/dL
Hgb urine dipstick: NEGATIVE
Ketones, ur: NEGATIVE mg/dL
Leukocytes,Ua: NEGATIVE
Nitrite: NEGATIVE
Protein, ur: 30 mg/dL — AB
Specific Gravity, Urine: 1.014 (ref 1.005–1.030)
Squamous Epithelial / HPF: NONE SEEN (ref 0–5)
pH: 6 (ref 5.0–8.0)

## 2021-11-13 LAB — CBC
HCT: 36.8 % (ref 36.0–46.0)
Hemoglobin: 11.7 g/dL — ABNORMAL LOW (ref 12.0–15.0)
MCH: 28.8 pg (ref 26.0–34.0)
MCHC: 31.8 g/dL (ref 30.0–36.0)
MCV: 90.6 fL (ref 80.0–100.0)
Platelets: 208 10*3/uL (ref 150–400)
RBC: 4.06 MIL/uL (ref 3.87–5.11)
RDW: 14.5 % (ref 11.5–15.5)
WBC: 10.1 10*3/uL (ref 4.0–10.5)
nRBC: 0 % (ref 0.0–0.2)

## 2021-11-13 LAB — BASIC METABOLIC PANEL
Anion gap: 6 (ref 5–15)
BUN: 15 mg/dL (ref 8–23)
CO2: 33 mmol/L — ABNORMAL HIGH (ref 22–32)
Calcium: 8.7 mg/dL — ABNORMAL LOW (ref 8.9–10.3)
Chloride: 97 mmol/L — ABNORMAL LOW (ref 98–111)
Creatinine, Ser: 0.84 mg/dL (ref 0.44–1.00)
GFR, Estimated: >60 mL/min (ref >60)
Glucose, Bld: 152 mg/dL — ABNORMAL HIGH (ref 70–99)
Potassium: 4.2 mmol/L (ref 3.5–5.1)
Sodium: 136 mmol/L (ref 135–145)

## 2021-11-13 NOTE — ED Triage Notes (Signed)
Pt to ER via POV with complaints of dehydration. Recent d/c after being hospitalized with pneumonia in both lungs with new requirement for oxygen. Reports being discharged and now having no appetite, has been coughing a lot and states she has no interest in eating or drinking.

## 2021-11-14 ENCOUNTER — Emergency Department
Admission: EM | Admit: 2021-11-14 | Discharge: 2021-11-14 | Disposition: A | Discharge status: Home / Self Care | Payer: Federal, State, Local not specified - PPO | Attending: Emergency Medicine | Admitting: Emergency Medicine

## 2021-11-14 DIAGNOSIS — R531 Weakness: Secondary | ICD-10-CM

## 2021-11-14 NOTE — ED Notes (Signed)
Pt complains of decreased appetite, "when I drink water it runs right through me". Pt appears in no acute distress.

## 2021-11-14 NOTE — Discharge Instructions (Signed)
Your workup in the Emergency Department today was reassuring.  We did not find any specific abnormalities.  We recommend you drink plenty of fluids, take your regular medications and/or any new ones prescribed today, and follow up with the doctor(s) listed in these documents as recommended.  Return to the Emergency Department if you develop new or worsening symptoms that concern you.  

## 2021-11-14 NOTE — ED Provider Notes (Signed)
[facility] Provider Note    Event Date/Time   First MD Initiated Contact with Patient 11/14/21 0101     (approximate)   History   Weakness   HPI  [name] is a 76 y.o. female whose relevant medical history includes recent community-acquired pneumonia now requiring her to use 2 L of oxygen at home, as well as a history of diabetes, hypertension, and allergic rhinitis.  She presents for evaluation of generalized weakness.  She said that she was recently hospitalized  for pneumonia and that was when they started her on oxygen.  She has been home for about 10 days and reports that she is still not feeling well.  She does not have much of an appetite.  She has been trying to drink more fluids but when she does so she feels like she has to urinate too much.  She has not gotten back much energy.  She does not feel short of breath unless she is exerting herself and not using her oxygen but in general she says that her breathing is fine.  She denies having any pain including headache, neck pain, chest pain, and abdominal pain.  She has had no nausea or vomiting and is not having any abdominal pain.  She mostly just does not feel well yet after coming home from the hospital.  She tested negative for influenza and COVID and finished her antibiotics for pneumonia.     Physical Exam   Triage Vital Signs: ED Triage Vitals [11/13/21 1510]  Enc Vitals Group     BP (!) 144/65     Pulse Rate 71     Resp 18     Temp 98.4 F (36.9 C)     Temp Source Oral     SpO2 96 %     Weight      Height 1.727 m (5\' 8" )     Head Circumference      Peak Flow      Pain Score 0     Pain Loc      Pain Edu?      Excl. in [place]?     Most recent vital signs: Vitals:   11/13/21 2038 11/14/21 0034  BP: (!) 146/64   Pulse: 78 73  Resp: 18   Temp:    SpO2: 98% 97%     General: Awake, no distress.  Speaking easily and comfortably. CV:  Good peripheral perfusion.  No  tachycardia.  No murmurs on auscultation. Resp:  Normal effort.  No accessory muscle usage.  No wheezes, rales, nor rhonchi upon auscultation. Abd:  No distention.  No tenderness to palpation. Other:  No focal neurological deficits including obvious cranial nerve deficits nor weakness in her extremities.   ED Results / Procedures / Treatments   Labs (all labs ordered are listed, but only abnormal results are displayed) Labs Reviewed  BASIC METABOLIC PANEL - Abnormal; Notable for the following components:      Result Value   Chloride 97 (*)    CO2 33 (*)    Glucose, Bld 152 (*)    Calcium 8.7 (*)    All other components within normal limits  CBC - Abnormal; Notable for the following components:   Hemoglobin 11.7 (*)    All other components within normal limits  URINALYSIS, ROUTINE W REFLEX MICROSCOPIC - Abnormal; Notable for the following components:   Color, Urine YELLOW (*)    APPearance CLEAR (*)    Protein,  ur 30 (*)    Bacteria, UA RARE (*)    All other components within normal limits  CBG MONITORING, ED     EKG  ED ECG REPORT I, Hinda Kehr, the attending physician, personally viewed and interpreted this ECG.  Date: 11/13/2021 EKG Time: 15: 13 Rate: 65 Rhythm: normal sinus rhythm QRS Axis: normal Intervals: normal ST/T Wave abnormalities: Non-specific ST segment / T-wave changes, but no clear evidence of acute ischemia. Narrative Interpretation: no definitive evidence of acute ischemia; does not meet STEMI criteria.    RADIOLOGY I personally reviewed the patient's chest x-ray and I see no evidence of pneumonia nor pulmonary edema.  I reviewed the radiology report and the radiologist also identifies no acute or emergent abnormality.    PROCEDURES:  Critical Care performed: No  Procedures   MEDICATIONS ORDERED IN ED: Medications - No data to display   IMPRESSION / MDM / [place] / ED COURSE  I reviewed the triage vital signs and the  nursing notes.                              Differential diagnosis includes, but is not limited to, recurrent pneumonia, pulmonary edema, viral infection, deconditioning, general malaise.  Labs/studies ordered include EKG, chest x-ray, urinalysis, basic metabolic panel, CBC.  I reviewed the results of all of the testing.  The patient has no urinary symptoms and a reassuring urinalysis.  Basic metabolic panel is essentially normal with no abnormalities suggestive of an acute issue.  CBC is also within normal limits except for a very slightly decreased hemoglobin but still reassuring 11.7.  No indication of ischemia on EKG.  Vital signs are stable and within normal limits other than mild hypertension.  I reviewed the discharge summary from Dr. Manuella Ghazi with the hospitalist service from her [facility] hospitalization, discharged on 12/27.  I verify the information she provided including the diagnosis of community-acquired pneumonia and negative influenza and COVID-19.  The patient has family that can and is helping her at home.  She already has oxygen at home.  She is breathing easily and comfortably on her baseline 2 L of supplemental oxygen by nasal cannula.  Although she has some chronic medical conditions such as diabetes which can contribute to possible morbidity/mortality given the increased risk of infection, her evaluation is very reassuring tonight.  I considered whether or not the patient would benefit from hospitalization, but given the reassuring work-up and no clear indication of a recurring or new acute issue, I believe she will be appropriate for discharge and outpatient follow-up.    I discussed all this with the patient and she understands and agrees.  She will continue her regular regimen at home and follow-up with her primary care doctor.  I gave my usual and customary return precautions.       FINAL CLINICAL IMPRESSION(S) / ED DIAGNOSES   Final diagnoses:  Generalized weakness      Rx / DC Orders   ED Discharge Orders     None        Note:  This document was prepared using Dragon voice recognition software and may include unintentional dictation errors.   Hinda Kehr, MD 11/14/21 [phone]

## 2021-11-16 ENCOUNTER — Ambulatory Visit: Payer: Federal, State, Local not specified - PPO | Admitting: Dermatology

## 2021-11-30 ENCOUNTER — Ambulatory Visit: Payer: Federal, State, Local not specified - PPO

## 2021-11-30 ENCOUNTER — Other Ambulatory Visit: Payer: Self-pay

## 2021-11-30 ENCOUNTER — Ambulatory Visit
Admission: RE | Admit: 2021-11-30 | Discharge: 2021-11-30 | Disposition: A | Discharge status: Home / Self Care | Payer: Federal, State, Local not specified - PPO | Source: Ambulatory Visit | Attending: Family Medicine | Admitting: Family Medicine

## 2021-11-30 DIAGNOSIS — J189 Pneumonia, unspecified organism: Secondary | ICD-10-CM | POA: Diagnosis present

## 2021-11-30 MED ORDER — IOHEXOL 300 MG/ML  SOLN
75.0000 mL | Freq: Once | INTRAMUSCULAR | Status: AC | PRN
  Administered 2021-11-30: 75 mL via INTRAVENOUS

## 2021-12-02 ENCOUNTER — Other Ambulatory Visit: Payer: Self-pay

## 2021-12-02 ENCOUNTER — Ambulatory Visit: Payer: Federal, State, Local not specified - PPO | Admitting: Dermatology

## 2021-12-02 ENCOUNTER — Encounter: Payer: Self-pay | Admitting: Dermatology

## 2021-12-02 DIAGNOSIS — I872 Venous insufficiency (chronic) (peripheral): Secondary | ICD-10-CM

## 2021-12-02 DIAGNOSIS — L817 Pigmented purpuric dermatosis: Secondary | ICD-10-CM | POA: Diagnosis not present

## 2021-12-02 DIAGNOSIS — L219 Seborrheic dermatitis, unspecified: Secondary | ICD-10-CM | POA: Diagnosis not present

## 2021-12-02 MED ORDER — KETOCONAZOLE 2 % EX SHAM: MEDICATED_SHAMPOO | CUTANEOUS | 5 refills | Status: AC

## 2021-12-02 NOTE — Progress Notes (Signed)
° °  Follow-Up Visit   Subjective  [name] is a 76 y.o. female who presents for the following: Rash (8 week recheck. Stasis dermatitis B/L legs. Using Mometasone and compression stockings as directed. Has stopped using lymphedema pumps, caused irritation and skin breakdown. Recheck right ankle. Using Clobetasol on scaly area. States this has improved. ).    The following portions of the chart were reviewed this encounter and updated as appropriate:      Review of Systems: No other skin or systemic complaints except as noted in HPI or Assessment and Plan.   Objective  Well appearing patient in no apparent distress; mood and affect are within normal limits.  A focused examination was performed including face, B/L legs. Relevant physical exam findings are noted in the Assessment and Plan.  B/L lower legs Yellow-brown macules at B/L pretibia   B/L lower legs Violaceous patch at right lateral post ankle with mild scale. Pitting edema 1-2+ at left ankle. No erythema, no rash at pretibia  Scalp Mild scaling   Assessment & Plan  Schamberg's purpura B/L lower legs  Benign, observe.    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  Recommend daily graduated compression hose/stockings- easiest to put on first thing in morning, remove at bedtime.  Elevate legs as much as possible. Avoid salt/sodium rich foods.  Use Mometasone qd as needed for flares only.  Continue CeraVe Anti-Itch cream every night. Stop Clobetasol to ankle (angiodermatitis vrs psoriasis- improved), start Mometasone qd to aa R ankle.  Related Medications mometasone (ELOCON) 0.1 % cream Apply 1 application topically daily as needed (Rash). To itchy red areas of lower legs. Can also  use at forehead occasionally for itch  Seborrheic dermatitis Scalp  Start Ketoconazole 2% shampoo as directed. Alternating with H&S.   Use Fluocinonide solution qd/bid as needed for itching/flares.  Seborrheic Dermatitis  -  is a chronic persistent rash characterized by pinkness and scaling most commonly of the mid face but also can occur on the scalp (dandruff), ears; mid chest, mid back and groin.  It tends to be exacerbated by stress and cooler weather.  People who have neurologic disease may experience new onset or exacerbation of existing seborrheic dermatitis.  The condition is not curable but treatable and can be controlled.   ketoconazole (NIZORAL) 2 % shampoo - Scalp 2-3 times a week lather on scalp, leave on 5-10 minutes, rinse well   Return if symptoms worsen or fail to improve.  I, Emelia Salisbury, CMA, am acting as scribe for Brendolyn Patty, MD.  Documentation: I have reviewed the above documentation for accuracy and completeness, and I agree with the above.  Brendolyn Patty MD

## 2021-12-02 NOTE — Patient Instructions (Addendum)
Legs/Stasis dermatitis:  Use Mometasone as needed for flares only.  Continue CeraVe Anti-Itch cream every night. Stop Clobetasol to ankle, use Mometasone here.  Scalp/Seborrheic keratoses:  Start Ketoconazole 2% shampoo as directed. Alternating with H&S.  Use Fluocinonide solution as needed for itching/flares.  Topical steroids (such as triamcinolone, fluocinolone, fluocinonide, mometasone, clobetasol, halobetasol, betamethasone, hydrocortisone) can cause thinning and lightening of the skin if they are used for too long in the same area. Your physician has selected the right strength medicine for your problem and area affected on the body. Please use your medication only as directed by your physician to prevent side effects.    Gentle Skin Care Guide  1. Bathe no more than once a day.  2. Avoid bathing in hot water  3. Use a mild soap like Dove, Vanicream, Cetaphil, CeraVe. Can use Lever 2000 or Cetaphil antibacterial soap  4. Use soap only where you need it. On most days, use it under your arms, between your legs, and on your feet. Let the water rinse other areas unless visibly dirty.  5. When you get out of the bath/shower, use a towel to gently blot your skin dry, don't rub it.  6. While your skin is still a little damp, apply a moisturizing cream such as Vanicream, CeraVe, Cetaphil, Eucerin, Sarna lotion or plain Vaseline Jelly. For hands apply Neutrogena [place] Hand Cream or Excipial Hand Cream.  7. Reapply moisturizer any time you start to itch or feel dry.  8. Sometimes using free and clear laundry detergents can be helpful. Fabric softener sheets should be avoided. Downy Free & Gentle liquid, or any liquid fabric softener that is free of dyes and perfumes, it acceptable to use  9. If your doctor has given you prescription creams you may apply moisturizers over them   If You Need Anything After Your Visit  If you have any questions or concerns for your doctor, please call  our main line at [phone] and press option 4 to reach your doctor's medical assistant. If no one answers, please leave a voicemail as directed and we will return your call as soon as possible. Messages left after 4 pm will be answered the following business day.   You may also send [place] a message via [place]. We typically respond to MyChart messages within 1-2 business days.  For prescription refills, please ask your pharmacy to contact our office. Our fax number is [phone].  If you have an urgent issue when the clinic is closed that cannot wait until the next business day, you can page your doctor at the number below.    Please note that while we do our best to be available for urgent issues outside of office hours, we are not available 24/7.   If you have an urgent issue and are unable to reach [place], you may choose to seek medical care at your doctor's office, retail clinic, urgent care center, or emergency room.  If you have a medical emergency, please immediately call 911 or go to the emergency department.  Pager Numbers  - Dr. Nehemiah Massed: [phone]  - Dr. Laurence Ferrari: [phone]  - Dr. Nicole Kindred: [phone]  In the event of inclement weather, please call our main line at [phone] for an update on the status of any delays or closures.  Dermatology Medication Tips: Please keep the boxes that topical medications come in in order to help keep track of the instructions about where and how to use these. Pharmacies typically print the medication instructions  only on the boxes and not directly on the medication tubes.   If your medication is too expensive, please contact our office at [phone] option 4 or send [place] a message through [place].   We are unable to tell what your co-pay for medications will be in advance as this is different depending on your insurance coverage. However, we may be able to find a substitute medication at lower cost or fill out paperwork to get insurance to  cover a needed medication.   If a prior authorization is required to get your medication covered by your insurance company, please allow [place] 1-2 business days to complete this process.  Drug prices often vary depending on where the prescription is filled and some pharmacies may offer cheaper prices.  The website www.goodrx.com contains coupons for medications through different pharmacies. The prices here do not account for what the cost may be with help from insurance (it may be cheaper with your insurance), but the website can give you the price if you did not use any insurance.  - You can print the associated coupon and take it with your prescription to the pharmacy.  - You may also stop by our office during regular business hours and pick up a GoodRx coupon card.  - If you need your prescription sent electronically to a different pharmacy, notify our office through [facility] or by phone at [phone] option 4.     Si Usted Necesita Algo Despus de Su Visita  Tambin puede enviarnos un mensaje a travs de Pharmacist, community. Por lo general respondemos a los mensajes de MyChart en el transcurso de 1 a 2 das hbiles.  Para renovar recetas, por favor pida a su farmacia que se ponga en contacto con nuestra oficina. Harland Dingwall de fax es [place] [phone].  Si tiene un asunto urgente cuando la clnica est cerrada y que no puede esperar hasta el siguiente da hbil, puede llamar/localizar a su doctor(a) al nmero que aparece a continuacin.   Por favor, tenga en cuenta que aunque hacemos todo lo posible para estar disponibles para asuntos urgentes fuera del horario de [place], no estamos disponibles las 24 horas del da, los 7 das de la [place].   Si tiene un problema urgente y no puede comunicarse con nosotros, puede optar por buscar atencin mdica  en el consultorio de su doctor(a), en una clnica privada, en un centro de atencin urgente o en una sala de emergencias.  Si tiene Conservator, museum/gallery, por favor llame inmediatamente al 911 o vaya a la sala de emergencias.  Nmeros de bper  - Dr. Nehemiah Massed: [phone]  - Dra. Moye: [phone]  - Dra. Nicole Kindred: [phone]  En caso de inclemencias del [place], por favor llame a Johnsie Kindred principal al [phone] para una actualizacin sobre el [place] de cualquier retraso o cierre.  Consejos para la medicacin en dermatologa: Por favor, guarde las cajas en las que vienen los medicamentos de uso tpico para ayudarle a seguir las instrucciones sobre dnde y cmo usarlos. Las farmacias generalmente imprimen las instrucciones del medicamento slo en las cajas y no directamente en los tubos del [place].   Si su medicamento es muy caro, por favor, pngase en contacto con Zigmund Daniel llamando al [phone] y presione la opcin 4 o envenos un mensaje a travs de Pharmacist, community.   No podemos decirle cul ser su copago por los medicamentos por adelantado ya que esto es diferente dependiendo de la cobertura de su seguro. Sin embargo, es  posible que podamos encontrar un medicamento sustituto a Electrical engineer un formulario para que el seguro cubra el medicamento que se considera necesario.   Si se requiere una autorizacin previa para que su compaa de seguros [place] su medicamento, por favor permtanos de 1 a 2 das hbiles para completar este proceso.  Los precios de los medicamentos varan con frecuencia dependiendo del Environmental consultant de dnde se surte la receta y alguna farmacias pueden ofrecer precios ms baratos.  El sitio web www.goodrx.com tiene cupones para medicamentos de Airline pilot. Los precios aqu no tienen en cuenta lo que podra costar con la ayuda del seguro (puede ser ms barato con su seguro), pero el sitio web puede darle el precio si no utiliz Research scientist (physical sciences).  - Puede imprimir el cupn correspondiente y llevarlo con su receta a la farmacia.  - Tambin puede pasar por nuestra oficina durante el  horario de atencin regular y Charity fundraiser una tarjeta de cupones de GoodRx.  - Si necesita que su receta se enve electrnicamente a una farmacia diferente, informe a nuestra oficina a travs de MyChart de [place] o por telfono llamando al [phone] y presione la opcin 4.

## 2022-03-11 ENCOUNTER — Other Ambulatory Visit: Payer: Self-pay | Admitting: Infectious Diseases

## 2022-03-11 DIAGNOSIS — G459 Transient cerebral ischemic attack, unspecified: Secondary | ICD-10-CM

## 2022-03-12 ENCOUNTER — Other Ambulatory Visit: Payer: Self-pay | Admitting: Infectious Diseases

## 2022-03-12 DIAGNOSIS — Z1231 Encounter for screening mammogram for malignant neoplasm of breast: Secondary | ICD-10-CM

## 2022-03-17 ENCOUNTER — Ambulatory Visit
Admission: RE | Admit: 2022-03-17 | Discharge: 2022-03-17 | Disposition: A | Discharge status: Home / Self Care | Payer: Federal, State, Local not specified - PPO | Source: Ambulatory Visit | Attending: Infectious Diseases | Admitting: Infectious Diseases

## 2022-03-17 DIAGNOSIS — G459 Transient cerebral ischemic attack, unspecified: Secondary | ICD-10-CM

## 2022-08-10 ENCOUNTER — Encounter: Payer: Self-pay | Admitting: *Deleted

## 2022-08-10 ENCOUNTER — Encounter: Payer: Federal, State, Local not specified - PPO | Attending: Infectious Diseases | Admitting: *Deleted

## 2022-08-10 VITALS — BP 150/70 | Ht 65.0 in | Wt 212.2 lb

## 2022-08-10 DIAGNOSIS — Z713 Dietary counseling and surveillance: Secondary | ICD-10-CM | POA: Insufficient documentation

## 2022-08-10 DIAGNOSIS — E119 Type 2 diabetes mellitus without complications: Secondary | ICD-10-CM

## 2022-08-10 DIAGNOSIS — E1142 Type 2 diabetes mellitus with diabetic polyneuropathy: Secondary | ICD-10-CM | POA: Diagnosis not present

## 2022-08-10 NOTE — Progress Notes (Signed)
Diabetes Self-Management Education  Visit Type: First/Initial  Appt. Start Time: 0825 Appt. End Time: 0945  08/10/2022  Ms. [name], identified by name and date of birth, is a 76 y.o. female with a diagnosis of Diabetes: Type 2.   ASSESSMENT  Blood pressure (!) 150/70, height '5\' 5"'$  (1.651 m), weight 212 lb 3.2 oz (96.3 kg). Body mass index is 35.31 kg/m.   Diabetes Self-Management Education - 08/10/22 1136       Visit Information   Visit Type First/Initial      Initial Visit   Diabetes Type Type 2    Date Diagnosed Pt reports 6 months but A1C's show diabetes diagnosis since 11/2019    Are you currently following a meal plan? No    Are you taking your medications as prescribed? Yes      Health Coping   How would you rate your overall health? Fair      Psychosocial Assessment   Patient Belief/Attitude about Diabetes Denial   "not sure if I have it - sugar levels coming down"   What is the hardest part about your diabetes right now, causing you the most concern, or is the most worrisome to you about your diabetes?   Making healty food and beverage choices;Checking blood sugar    Self-care barriers None    Self-management support Doctor's office;Family    Patient Concerns Nutrition/Meal planning;Medication;Glycemic Control;Weight Control;Monitoring;Healthy Lifestyle    Special Needs None    Preferred Learning Style Auditory;Visual;Hands on    Learning Readiness Ready    How often do you need to have someone help you when you read instructions, pamphlets, or other written materials from your doctor or pharmacy? 1 - Never    What is the last grade level you completed in school? 12th      Pre-Education Assessment   Patient understands the diabetes disease and treatment process. Needs Instruction    Patient understands incorporating nutritional management into lifestyle. Needs Instruction    Patient undertands incorporating physical activity into lifestyle. Needs  Instruction    Patient understands using medications safely. Needs Instruction    Patient understands monitoring blood glucose, interpreting and using results Needs Review    Patient understands prevention, detection, and treatment of acute complications. Needs Instruction    Patient understands prevention, detection, and treatment of chronic complications. Needs Review    Patient understands how to develop strategies to address psychosocial issues. Needs Instruction    Patient understands how to develop strategies to promote health/change behavior. Needs Instruction      Complications   Last HgB A1C per patient/outside source 7.7 %   07/07/2022   How often do you check your blood sugar? 0 times/day (not testing)   She reports she tried to check her blood sugar 2 weeks ago but didn't get a reading. She will start checking again. She reports fasting blood sugars of 100-130's mg/dL and sometimes higher.   Have you had a dilated eye exam in the past 12 months? Yes    Have you had a dental exam in the past 12 months? Yes    Are you checking your feet? Yes    How many days per week are you checking your feet? 7      Dietary Intake   Breakfast egg, 45 calorie bread, raisin bran and milk    Snack (morning) granola bar, apple,  tangerine    Lunch 1/2 cheese sandwich or peanut butter toast or salad - sometimes with granola bar  Snack (afternoon) nuts, granola bar    Dinner chicken, beef, pork; 1/2 of potato, salad, peas, beans, corn, green beans, rice, pasta, broccoli - reports loves all vegetables    Beverage(s) water, diet soda      Activity / Exercise   Activity / Exercise Type Light (walking / raking leaves)   stationary bike   How many days per week do you exercise? 3    How many minutes per day do you exercise? 60    Total minutes per week of exercise 180      Patient Education   Previous Diabetes Education No    Disease Pathophysiology Definition of diabetes, type 1 and 2, and the  diagnosis of diabetes;Factors that contribute to the development of diabetes;Explored patient's options for treatment of their diabetes    Healthy Eating Role of diet in the treatment of diabetes and the relationship between the three main macronutrients and blood glucose level;Food label reading, portion sizes and measuring food.;Reviewed blood glucose goals for pre and post meals and how to evaluate the patients' food intake on their blood glucose level.;Meal timing in regards to the patients' current diabetes medication.    Being Active Role of exercise on diabetes management, blood pressure control and cardiac health.    Medications Reviewed patients medication for diabetes, action, purpose, timing of dose and side effects.    Monitoring Purpose and frequency of SMBG.;Taught/discussed recording of test results and interpretation of SMBG.;Identified appropriate SMBG and/or A1C goals.    Chronic complications Relationship between chronic complications and blood glucose control    Diabetes Stress and Support Identified and addressed patients feelings and concerns about diabetes      Individualized Goals (developed by patient)   Reducing Risk Other (comment)   improve blood sugars, decrease medications, prevent diabetes complications, lose weight, lead a healthier lifestyle, become more fit     Outcomes   Expected Outcomes Demonstrated interest in learning. Expect positive outcomes    Future DMSE 4-6 wks         Individualized Plan for Diabetes Self-Management Training:   Learning Objective:  Patient will have a greater understanding of diabetes self-management. Patient education plan is to attend individual and/or group sessions per assessed needs and concerns.   Plan:   Patient Instructions  Check blood sugars before breakfast OR 2 hrs after one meal every day Bring blood sugar records to the next appointment  Exercise: Continue bike exercise  for    60  minutes   3  days a week  Eat  3 meals day,   1-2  snacks a day Space meals 4-6 hours apart Limit diet sodas 1-2 per day Increase water to 4-5 servings per day Complete 3 Day Food Record and bring to next appt  Return for appointment on:   Tuesday September 14, 2022 at 8:30 am with Freda Munro (nurse)  Expected Outcomes:  Demonstrated interest in learning. Expect positive outcomes  Education material provided:  General Meal Planning Guidelines Simple Meal Plan 3 Day Food Record  If problems or questions, patient to contact team via:   Johny Drilling, RN, [place] [phone]  Future DSME appointment: 4-6 wks September 14, 2022 with this educator

## 2022-08-10 NOTE — Patient Instructions (Addendum)
Check blood sugars before breakfast OR 2 hrs after one meal every day Bring blood sugar records to the next appointment  Exercise: Continue bike exercise  for    60  minutes   3  days a week  Eat 3 meals day,   1-2  snacks a day Space meals 4-6 hours apart Limit diet sodas 1-2 per day Increase water to 4-5 servings per day Complete 3 Day Food Record and bring to next appt  Return for appointment on:   Tuesday September 14, 2022 at 8:30 am with Freda Munro (nurse)

## 2022-09-06 ENCOUNTER — Encounter (INDEPENDENT_AMBULATORY_CARE_PROVIDER_SITE_OTHER): Payer: Self-pay

## 2022-09-14 ENCOUNTER — Encounter: Payer: Federal, State, Local not specified - PPO | Attending: Infectious Diseases | Admitting: *Deleted

## 2022-09-14 ENCOUNTER — Encounter: Payer: Self-pay | Admitting: *Deleted

## 2022-09-14 VITALS — BP 132/68 | Wt 209.2 lb

## 2022-09-14 DIAGNOSIS — E119 Type 2 diabetes mellitus without complications: Secondary | ICD-10-CM | POA: Diagnosis not present

## 2022-09-14 NOTE — Patient Instructions (Signed)
Check blood sugars before breakfast OR 2 hrs after one meal every day  Continue bike exercises for    60  minutes   3  days a week  Eat 3 meals day,   1-2  snacks a day Space meals 4-6 hours apart Don't skip meals - eat 1 protein and 1 carbohydrate serving Continue to limit diet sodas (2 servings/day)  Maintain water intake to at least 4 servings/day  Carry fast acting glucose and a snack at all times

## 2022-09-14 NOTE — Progress Notes (Signed)
Diabetes Self-Management Education  Visit Type: Follow-up  Appt. Start Time: 0825 Appt. End Time: 0900  09/14/2022  [name], identified by name and date of birth, is a 76 y.o. female with a diagnosis of Diabetes: Type 2.   ASSESSMENT  Blood pressure 132/68, weight 209 lb 3.2 oz (94.9 kg). Body mass index is 34.81 kg/m.   Diabetes Self-Management Education - 09/14/22 0904       Visit Information   Visit Type Follow-up      Initial Visit   Diabetes Type Type 2      Complications   How often do you check your blood sugar? 1-2 times/day    Fasting Blood glucose range (mg/dL) 130-179   Pt reports FBG's 130-40's mg/dL She was 150 mg/dL this am due to pain during the night.   Number of hypoglycemic episodes per month 2   Pt reports she had symptoms of hypoglycemia but doesn't check her blood sugars. Usually in the afternoon when she skips lunch.   Can you tell when your blood sugar is low? Yes    What do you do if your blood sugar is low? eats a snack    Have you had a dilated eye exam in the past 12 months? Yes    Have you had a dental exam in the past 12 months? Yes    Are you checking your feet? Yes    How many days per week are you checking your feet? 7      Dietary Intake   Breakfast 2-3 meals/day and 1-2 snacks    Beverage(s) drinking 4-5 servings of water/day and down to 3 servings of diet soda/day      Activity / Exercise   Activity / Exercise Type ADL's   She hasn't been able to exercise in the last 2 weeks due to back pain.     Patient Education   Disease Pathophysiology Definition of diabetes, type 1 and 2, and the diagnosis of diabetes;Factors that contribute to the development of diabetes;Explored patient's options for treatment of their diabetes    Healthy Eating Role of diet in the treatment of diabetes and the relationship between the three main macronutrients and blood glucose level;Food label reading, portion sizes and measuring food.;Carbohydrate  counting;Meal timing in regards to the patients' current diabetes medication.    Being Active Role of exercise on diabetes management, blood pressure control and cardiac health.    Medications Reviewed patients medication for diabetes, action, purpose, timing of dose and side effects.    Monitoring Purpose and frequency of SMBG.;Taught/discussed recording of test results and interpretation of SMBG.;Identified appropriate SMBG and/or A1C goals.    Acute complications Taught prevention, symptoms, and  treatment of hypoglycemia - the 15 rule.    Chronic complications Relationship between chronic complications and blood glucose control    Diabetes Stress and Support Identified and addressed patients feelings and concerns about diabetes;Role of stress on diabetes;Other (comment)   Role of chronic pain on blood sugars     Individualized Goals (developed by patient)   Nutrition Follow meal plan discussed    Physical Activity Exercise 3-5 times per week;60 minutes per day    Medications take my medication as prescribed    Monitoring  Test my blood glucose as discussed    Problem Solving Eating Pattern   don't skip meals - carry snack     Post-Education Assessment   Patient understands the diabetes disease and treatment process. Demonstrates understanding / competency  Patient understands incorporating nutritional management into lifestyle. Comprehends key points    Patient undertands incorporating physical activity into lifestyle. Demonstrates understanding / competency    Patient understands using medications safely. Comphrehends key points    Patient understands monitoring blood glucose, interpreting and using results Demonstrates understanding / competency    Patient understands prevention, detection, and treatment of acute complications. Needs Review    Patient understands prevention, detection, and treatment of chronic complications. Demonstrates understanding / competency    Patient  understands how to develop strategies to address psychosocial issues. Demonstrates understanding / competency    Patient understands how to develop strategies to promote health/change behavior. Demonstrates understanding / competency      Outcomes   Expected Outcomes Demonstrated interest in learning. Expect positive outcomes    Program Status Completed      Subsequent Visit   Since your last visit have you continued or begun to take your medications as prescribed? Yes    Since your last visit have you had your blood pressure checked? No    Since your last visit have you experienced any weight changes? Loss    Weight Loss (lbs) 3    Since your last visit, are you checking your blood glucose at least once a day? Yes         Individualized Plan for Diabetes Self-Management Training:   Learning Objective:  Patient will have a greater understanding of diabetes self-management. Patient education plan is to attend individual and/or group sessions per assessed needs and concerns.   Plan:   Patient Instructions  Check blood sugars before breakfast OR 2 hrs after one meal every day  Continue bike exercises for    60  minutes   3  days a week  Eat 3 meals day,   1-2  snacks a day Space meals 4-6 hours apart Don't skip meals - eat 1 protein and 1 carbohydrate serving Continue to limit diet sodas (2 servings/day)  Maintain water intake to at least 4 servings/day  Carry fast acting glucose and a snack at all times  Expected Outcomes:  Demonstrated interest in learning. Expect positive outcomes  Education material provided:  Symptoms, causes and treatments of Hypoglycemia Planning a Balanced Meal Healthy Snack Choices (ADA)  If problems or questions, patient to contact team via:   Johny Drilling, RN, CCM, [place] [phone]  Future DSME appointment:  PRN

## 2023-04-18 ENCOUNTER — Ambulatory Visit: Payer: Federal, State, Local not specified - PPO | Admitting: Physician Assistant

## 2023-04-22 ENCOUNTER — Encounter: Payer: Federal, State, Local not specified - PPO | Attending: Physician Assistant | Admitting: Physician Assistant

## 2023-04-22 DIAGNOSIS — E11622 Type 2 diabetes mellitus with other skin ulcer: Secondary | ICD-10-CM | POA: Insufficient documentation

## 2023-04-22 DIAGNOSIS — Z87891 Personal history of nicotine dependence: Secondary | ICD-10-CM | POA: Insufficient documentation

## 2023-04-22 DIAGNOSIS — L97822 Non-pressure chronic ulcer of other part of left lower leg with fat layer exposed: Secondary | ICD-10-CM | POA: Insufficient documentation

## 2023-04-22 DIAGNOSIS — I10 Essential (primary) hypertension: Secondary | ICD-10-CM | POA: Insufficient documentation

## 2023-04-22 DIAGNOSIS — I872 Venous insufficiency (chronic) (peripheral): Secondary | ICD-10-CM | POA: Insufficient documentation

## 2023-04-22 DIAGNOSIS — I890 Lymphedema, not elsewhere classified: Secondary | ICD-10-CM | POA: Insufficient documentation

## 2023-04-28 NOTE — Progress Notes (Signed)
[name] ([id number]) [id number] Page 1 of 9 Visit Report for 04/22/2023 Allergy List Details Patient Name: Date of Service: [name], [name] 04/22/2023 11:30 A M Medical Record Number: [id number] Patient Account Number: [id number] Date of Birth/Sex: Treating RN: Jan 22, 1946 (77 y.o. [name] Primary [name] Janelie Goltz: [name] Other Clinician: Referring Bell Carbo: Treating Linnet Bottari/Extender: [name] in Treatment: 0 Allergies Active Allergies Augmentin codeine metformin Allergy Notes Electronic Signature(s) Signed: 04/28/2023 12:58:51 PM By: [name] Entered By: Yevonne Pax on 04/22/2023 11:32:31 -------------------------------------------------------------------------------- Arrival Information Details Patient Name: Date of Service: [name], [name] J. 04/22/2023 11:30 A M Medical Record Number: [id number] Patient Account Number: [id number] Date of Birth/Sex: Treating RN: December 15, 1945 (77 y.o. [name] Primary [name] Cedar Roseman: [name] Other Clinician: Referring Philip Kotlyar: Treating Ardon Franklin/Extender: [name] in Treatment: 0 Visit Information Patient Arrived: Ambulatory Arrival Time: 11:30 Accompanied By: size Transfer Assistance: None Patient Identification Verified: Yes Secondary Verification Process Completed: Yes Patient Requires Transmission-Based Precautions: No Patient Has Alerts: No [name], [name] ([id number]) [id number] Page 2 of 9 Electronic Signature(s) Signed: 04/28/2023 12:58:51 PM By: [name] Entered By: Yevonne Pax on 04/22/2023 11:31:12 -------------------------------------------------------------------------------- Clinic Level of [name] Assessment Details Patient Name: Date of Service: [name], [name] 04/22/2023 11:30 A M Medical Record Number: [id number] Patient Account Number: [id number] Date  of Birth/Sex: Treating RN: 10-Oct-1946 (77 y.o. [name] Primary [name] Cardarius Senat: [name] Other Clinician: Referring Kriste Broman: Treating Nickia Boesen/Extender: [name] in Treatment: 0 Clinic Level of [name] Assessment Items TOOL 1 Quantity Score X- 1 0 Use when EandM and Procedure is performed on INITIAL visit ASSESSMENTS - Nursing Assessment / Reassessment X- 1 20 General Physical Exam (combine w/ comprehensive assessment (listed just below) when performed on new pt. evals) X- 1 25 Comprehensive Assessment (HX, ROS, Risk Assessments, Wounds Hx, etc.) ASSESSMENTS - Wound and Skin Assessment / Reassessment []  - 0 Dermatologic / Skin Assessment (not related to wound area) ASSESSMENTS - Ostomy and/or Continence Assessment and [name] []  - 0 Incontinence Assessment and Management []  - 0 Ostomy [name] Assessment and Management (repouching, etc.) PROCESS - Coordination of [name] X - Simple Patient / Family Education for ongoing [name] 1 15 []  - 0 Complex (extensive) Patient / Family Education for ongoing [name] []  - 0 Staff obtains Chiropractor, Records, T Results / Process Orders est []  - 0 Staff telephones HHA, Nursing Homes / Clarify orders / etc []  - 0 Routine Transfer to another Facility (non-emergent condition) []  - 0 Routine Hospital Admission (non-emergent condition) X- 1 15 New Admissions / Manufacturing engineer / Ordering NPWT Apligraf, etc. , []  - 0 Emergency Hospital Admission (emergent condition) PROCESS - Special Needs []  - 0 Pediatric / Minor Patient Management []  - 0 Isolation Patient Management []  - 0 Hearing / Language / Visual special needs []  - 0 Assessment of Community assistance (transportation, D/C planning, etc.) []  - 0 Additional assistance / Altered mentation []  - 0 Support Surface(s) Assessment (bed, cushion, seat, etc.) INTERVENTIONS - Miscellaneous []  - 0 External ear exam []  - 0 Patient Transfer (multiple staff  / [employer] / Similar devices) [name], [name] ([id number]) [id number] Page 3 of 9 []  - 0 Simple Staple / Suture removal (25 or less) []  - 0 Complex Staple / Suture removal (26 or more) []  - 0 Hypo/Hyperglycemic Management (do not check if billed separately) X- 1 15 Ankle / Brachial Index (ABI) - do not check if billed  separately Has the patient been seen at the hospital within the last three years: Yes Total Score: 90 Level Of [name]: New/Established - Level 3 Electronic Signature(s) Signed: 04/28/2023 12:58:51 PM By: [name] Entered By: Yevonne Pax on 04/22/2023 12:14:16 -------------------------------------------------------------------------------- Encounter Discharge Information Details Patient Name: Date of Service: [name], [name] J. 04/22/2023 11:30 A M Medical Record Number: [id number] Patient Account Number: [id number] Date of Birth/Sex: Treating RN: Apr 25, 1946 (77 y.o. [name] Primary [name] Jassmine Vandruff: [name] Other Clinician: Referring Cassey Hurrell: Treating Allin Frix/Extender: [name] in Treatment: 0 Encounter Discharge Information Items Post Procedure Vitals Discharge Condition: Stable Temperature (F): 98.2 Ambulatory Status: Ambulatory Pulse (bpm): 78 Discharge Destination: Home Respiratory Rate (breaths/min): 18 Transportation: Private Auto Blood Pressure (mmHg): 160/73 Accompanied By: self Schedule Follow-up Appointment: Yes Clinical Summary of [name]: Electronic Signature(s) Signed: 04/22/2023 12:22:53 PM By: [name] Entered By: Yevonne Pax on 04/22/2023 12:22:53 -------------------------------------------------------------------------------- Lower Extremity Assessment Details Patient Name: Date of Service: [name] 04/22/2023 11:30 A M Medical Record Number: [id number] Patient Account Number: [id number] Date of Birth/Sex: Treating RN: 13-May-1946 (77 y.o. [name] Primary [name] Ronson Hagins: [name] Other Clinician: Referring Devine Klingel: Treating Alfredia Desanctis/Extender: [name] in Treatment: 0 Edema Assessment Left: [Left: Right] [name]: :] Assessed: [Left: No] [Right: No] Edema: [Left: Ye] [Right: s] Calf Left: Right: Point of Measurement: 35 cm From Medial Instep 38 cm Ankle Left: Right: Point of Measurement: 10 cm From Medial Instep 23 cm Knee To Floor Left: Right: From Medial Instep 45 cm Vascular Assessment Pulses: Dorsalis Pedis Palpable: [Left:Yes] Doppler Audible: [Left:Yes] Blood Pressure: Brachial: [Left:160] Ankle: [Left:Dorsalis Pedis: 120 0.75] Electronic Signature(s) Signed: 04/28/2023 12:58:51 PM By: [name] Entered By: Yevonne Pax on 04/22/2023 11:50:16 -------------------------------------------------------------------------------- Multi Wound Chart Details Patient Name: Date of Service: [name], [name] J. 04/22/2023 11:30 A M Medical Record Number: [id number] Patient Account Number: [id number] Date of Birth/Sex: Treating RN: [birth year] (77 y.o. [name] Primary [name] Markies Mowatt: [name] Other Clinician: Referring Maija Biggers: Treating Orian Figueira/Extender: [name] in Treatment: 0 Vital Signs Height(in): 68 Pulse(bpm): 78 Weight(lbs): 200 Blood Pressure(mmHg): 160/73 Body Mass Index(BMI): 30.4 Temperature(F): 98.2 Respiratory Rate(breaths/min): 18 [1:Photos:] [N/A:N/A] Left, Distal, Lateral Lower Leg N/A N/A Wound Location: [name], [name] ([id number]) [id number] Page 5 of 9 Trauma N/A N/A Wounding Event: Diabetic Wound/Ulcer of the Lower N/A N/A Primary Etiology: Extremity Lymphedema, Hypertension, Type II N/A N/A Comorbid History: Diabetes 02/07/2023 N/A N/A Date Acquired: 0 N/A N/A Weeks of Treatment: Open N/A N/A Wound Status: No N/A N/A Wound Recurrence: 0.5x0.9x0.3 N/A  N/A Measurements L x W x D (cm) 0.353 N/A N/A A (cm) : rea 0.106 N/A N/A Volume (cm) : Grade 1 N/A N/A Classification: Medium N/A N/A Exudate A mount: Serosanguineous N/A N/A Exudate Type: red, brown N/A N/A Exudate Color: Medium (34-66%) N/A N/A Granulation A mount: Red N/A N/A Granulation Quality: Medium (34-66%) N/A N/A Necrotic A mount: Fat Layer (Subcutaneous Tissue): Yes N/A N/A Exposed Structures: Fascia: No Tendon: No Muscle: No Joint: No Bone: No None N/A N/A Epithelialization: Treatment Notes Electronic Signature(s) Signed: 04/28/2023 12:58:51 PM By: [name] Entered By: Yevonne Pax on 04/22/2023 11:50:42 -------------------------------------------------------------------------------- Multi-Disciplinary [name] Plan Details Patient Name: Date of Service: [name], [name] J. 04/22/2023 11:30 A M Medical Record Number: [id number] Patient Account Number: [id number] Date of Birth/Sex: Treating RN: Jun 02, 1946 (77 y.o. [name] Primary [name] Johari Pinney: [name] Other Clinician: Referring Andoni Busch: Treating  Kairah Leoni/Extender: [name] in Treatment: 0 Active Inactive Necrotic Tissue Nursing Diagnoses: Knowledge deficit related to management of necrotic/devitalized tissue Goals: Patient/caregiver will verbalize understanding of reason and process for debridement of necrotic tissue Date Initiated: 04/22/2023 Target Resolution Date: 05/22/2023 Goal Status: Active Interventions: Assess patient pain level pre-, during and post procedure and prior to discharge Notes: Wound/Skin Impairment Nursing Diagnoses: [name], [name] ([id number]) [id number] Page 6 of 9 Knowledge deficit related to ulceration/compromised skin integrity Goals: Patient/caregiver will verbalize understanding of skin [name] regimen Date Initiated: 04/22/2023 Target Resolution Date: 05/22/2023 Goal Status: Active Ulcer/skin  breakdown will have a volume reduction of 30% by week 4 Date Initiated: 04/22/2023 Target Resolution Date: 05/22/2023 Goal Status: Active Ulcer/skin breakdown will have a volume reduction of 50% by week 8 Date Initiated: 04/22/2023 Target Resolution Date: 06/22/2023 Goal Status: Active Ulcer/skin breakdown will have a volume reduction of 80% by week 12 Date Initiated: 04/22/2023 Target Resolution Date: 07/23/2023 Goal Status: Active Ulcer/skin breakdown will heal within 14 weeks Date Initiated: 04/22/2023 Target Resolution Date: 08/22/2023 Goal Status: Active Interventions: Assess patient/caregiver ability to obtain necessary supplies Assess patient/caregiver ability to perform ulcer/skin [name] regimen upon admission and as needed Assess ulceration(s) every visit Notes: Electronic Signature(s) Signed: 04/28/2023 12:58:51 PM By: [name] Entered By: Yevonne Pax on 04/22/2023 11:52:41 -------------------------------------------------------------------------------- Pain Assessment Details Patient Name: Date of Service: [name], [name] 04/22/2023 11:30 A M Medical Record Number: [id number] Patient Account Number: [id number] Date of Birth/Sex: Treating RN: [birth year] (77 y.o. [name] Primary [name] Shelanda Duvall: [name] Other Clinician: Referring Kadee Philyaw: Treating Abdiaziz Klahn/Extender: [name] in Treatment: 0 Active Problems Location of Pain Severity and Description of Pain Patient Has Paino No Site Locations [place], Wilson J ([id number]) [id number] Page 7 of 9 Pain Management and Medication Current Pain Management: Electronic Signature(s) Signed: 04/28/2023 12:58:51 PM By: [name] Entered By: Yevonne Pax on 04/22/2023 11:31:24 -------------------------------------------------------------------------------- Patient/Caregiver Education Details Patient Name: Date of Service: [name]  6/14/2024andnbsp11:30 A M Medical Record Number: [id number] Patient Account Number: [id number] Date of Birth/Gender: Treating RN: 28-Jan-1946 (77 y.o. [name] Primary [name] Physician: [name] Other Clinician: Referring Physician: Treating Physician/Extender: [name] in Treatment: 0 Education Assessment Education Provided To: Patient Education Topics Provided Welcome T [name]-New Patient Packet: o Handouts: Welcome T [name] o Methods: Explain/Verbal Responses: State content correctly Electronic Signature(s) Signed: 04/28/2023 12:58:51 PM By: [name] Entered By: Yevonne Pax on 04/22/2023 11:53:00 -------------------------------------------------------------------------------- Wound Assessment Details Patient Name: Date of Service: [name], [name] 04/22/2023 11:30 A M Medical Record Number: [id number] Patient Account Number: [id number] Date of Birth/Sex: Treating RN: [birth year] (77 y.o. [name] Primary [name] Quenten Nawaz: [name] Other Clinician: Referring Ileigh Mettler: Treating Josip Merolla/Extender: [name] in Treatment: 0 Wound Status Wound Number: 1 Primary Etiology: Diabetic Wound/Ulcer of the Lower Extremity [name], [name] ([id number]) [id number] Page 8 of 9 Wound Location: Left, Distal, Lateral Lower Leg Wound Status: Open Wounding Event: Trauma Comorbid History: Lymphedema, Hypertension, Type II Diabetes Date Acquired: 02/07/2023 Weeks Of Treatment: 0 Clustered Wound: No Photos Wound Measurements Length: (cm) 0.5 Width: (cm) 0.9 Depth: (cm) 0.3 Area: (cm) 0.353 Volume: (cm) 0.106 % Reduction in Area: % Reduction in Volume: Epithelialization: None Tunneling: No Undermining: No Wound Description Classification: Grade 1 Exudate Amount: Medium Exudate Type: Serosanguineous Exudate Color: red, brown Foul Odor After  Cleansing: No Slough/Fibrino Yes Wound Bed  Granulation Amount: Medium (34-66%) Exposed Structure Granulation Quality: Red Fascia Exposed: No Necrotic Amount: Medium (34-66%) Fat Layer (Subcutaneous Tissue) Exposed: Yes Necrotic Quality: Adherent Slough Tendon Exposed: No Muscle Exposed: No Joint Exposed: No Bone Exposed: No Treatment Notes Wound #1 (Lower Leg) Wound Laterality: Left, Lateral, Distal Cleanser [place] Ancillary Kit - 15 Day Supply Discharge Instruction: Use supplies as instructed; Kit contains: (15) Saline Bullets; (15) 3x3 Gauze; 15 pr Gloves Peri-Wound [name] Topical Primary Dressing Prisma 4.34 (in) Discharge Instruction: Moisten w/normal saline or sterile water; Cover wound as directed. Do not remove from wound bed. Secondary Dressing (BORDER) Zetuvit Plus SILICONE BORDER Dressing 4x4 (in/in) Discharge Instruction: Please do not put silicone bordered dressings under wraps. Use non-bordered dressing only. Secured With Compression Wrap Compression Stockings Facilities manager) Signed: 04/28/2023 12:58:51 PM By: [name] Entered By: Yevonne Pax on 04/22/2023 11:49:06 Boda, Nestor Ramp ([id number]) [id number] Page 9 of 9 -------------------------------------------------------------------------------- Vitals Details Patient Name: Date of Service: [name], [name] 04/22/2023 11:30 A M Medical Record Number: [id number] Patient Account Number: [id number] Date of Birth/Sex: Treating RN: Mar 19, 1946 (77 y.o. [name] Primary [name] Levora Werden: [name] Other Clinician: Referring Undra Harriman: Treating Dreon Pineda/Extender: [name] in Treatment: 0 Vital Signs Time Taken: 11:31 Temperature (F): 98.2 Height (in): 68 Pulse (bpm): 78 Source: Stated Respiratory Rate (breaths/min): 18 Weight (lbs): 200 Blood Pressure (mmHg): 160/73 Source: Stated Reference Range: 80 - 120 mg / dl Body  Mass Index (BMI): 30.4 Electronic Signature(s) Signed: 04/28/2023 12:58:51 PM By: [name] Entered By: Yevonne Pax on 04/22/2023 11:32:04

## 2023-04-28 NOTE — Progress Notes (Signed)
[name], DUDIK ([id number]) [id number] Page 1 of 5 Visit Report for 04/22/2023 Abuse Risk Screen Details Patient Name: Date of Service: BEXLEY, BOOTHE 04/22/2023 11:30 A M Medical Record Number: [id number] Patient Account Number: [id number] Date of Birth/Sex: Treating RN: 1946-03-11 (77 y.o. [name] Primary Care Gavriel Holzhauer: [name] Other Clinician: Referring Drayce Tawil: Treating Christop Hippert/Extender: [name] in Treatment: 0 Abuse Risk Screen Items Answer ABUSE RISK SCREEN: Has anyone close to you tried to hurt or harm you recentlyo No Do you feel uncomfortable with anyone in your familyo No Has anyone forced you do things that you didnt want to doo No Electronic Signature(s) Signed: 04/28/2023 12:58:51 PM By: [name] Entered By: Yevonne Pax on 04/22/2023 11:35:38 -------------------------------------------------------------------------------- Activities of Daily Living Details Patient Name: Date of Service: [name], PALELLA 04/22/2023 11:30 A M Medical Record Number: [id number] Patient Account Number: [id number] Date of Birth/Sex: Treating RN: Sep 28, 1946 (77 y.o. [name] Primary Care Chalee Hirota: [name] Other Clinician: Referring Demitria Hay: Treating Braiden Presutti/Extender: [name] in Treatment: 0 Activities of Daily Living Items Answer Activities of Daily Living (Please select one for each item) Drive Automobile Completely Able T Medications ake Completely Able Use T elephone Completely Able Care for Appearance Completely Able Use T oilet Completely Able Bath / Shower Completely Able Dress Self Completely Able Feed Self Completely Able Walk Completely Able Get In / Out Bed Completely Able Housework Completely CANNON, RATHER ([id number]) [phone] [id number] Page 2 of 5 Prepare Meals Completely Able Handle Money  Completely Able Shop for Self Completely Able Electronic Signature(s) Signed: 04/28/2023 12:58:51 PM By: [name] Entered By: Yevonne Pax on 04/22/2023 11:36:00 -------------------------------------------------------------------------------- Education Screening Details Patient Name: Date of Service: [name], [name] 04/22/2023 11:30 A M Medical Record Number: [id number] Patient Account Number: [id number] Date of Birth/Sex: Treating RN: [birth year] (77 y.o. [name] Primary Care Verdon Ferrante: [name] Other Clinician: Referring Dawnelle Warman: Treating Annaya Bangert/Extender: [name] in Treatment: 0 Primary Learner Assessed: Patient Learning Preferences/Education Level/Primary Language Learning Preference: Explanation Highest Education Level: High School Preferred Language: English Cognitive Barrier Language Barrier: No Translator Needed: No Memory Deficit: No Emotional Barrier: No Cultural/Religious Beliefs Affecting Medical Care: No Physical Barrier Impaired Vision: Yes Glasses Impaired Hearing: Yes Hearing Aid Decreased Hand dexterity: No Knowledge/Comprehension Knowledge Level: Medium Comprehension Level: High Ability to understand written instructions: High Ability to understand verbal instructions: High Motivation Anxiety Level: Anxious Cooperation: Cooperative Education Importance: Acknowledges Need Interest in Health Problems: Asks Questions Perception: Coherent Willingness to Engage in Self-Management High Activities: Readiness to Engage in Self-Management High Activities: Electronic Signature(s) Signed: 04/28/2023 12:58:51 PM By: [name] Entered By: Yevonne Pax on 04/22/2023 11:37:06 Gayla Doss ([id number]) [phone] [id number] Page 3 of 5 -------------------------------------------------------------------------------- Fall Risk Assessment Details Patient Name: Date of  Service: YAMELI, TRULOCK 04/22/2023 11:30 A M Medical Record Number: [id number] Patient Account Number: [id number] Date of Birth/Sex: Treating RN: 08-22-1946 (77 y.o. [name] Primary Care Barabara Motz: [name] Other Clinician: Referring Marios Gaiser: Treating Camri Molloy/Extender: [name] in Treatment: 0 Fall Risk Assessment Items Have you had 2 or more falls in the last 12 monthso 0 No Have you had any fall that resulted in injury in the last 12 monthso 0 No FALLS RISK SCREEN History of falling - immediate or within 3 months 0 No Secondary diagnosis (Do you have 2 or more medical diagnoseso) 0 No Ambulatory aid  None/bed rest/wheelchair/nurse 0 Yes Crutches/cane/walker 0 No Furniture 0 No Intravenous therapy Access/Saline/Heparin Lock 0 No Gait/Transferring Normal/ bed rest/ wheelchair 0 Yes Weak (short steps with or without shuffle, stooped but able to lift head while walking, may seek 0 No support from furniture) Impaired (short steps with shuffle, may have difficulty arising from chair, head down, impaired 0 No balance) Mental Status Oriented to own ability 0 Yes Electronic Signature(s) Signed: 04/28/2023 12:58:51 PM By: [name] Entered By: Yevonne Pax on 04/22/2023 11:38:07 -------------------------------------------------------------------------------- Foot Assessment Details Patient Name: Date of Service: Christine Saxon J. 04/22/2023 11:30 A M Medical Record Number: [id number] Patient Account Number: [id number] Date of Birth/Sex: Treating RN: Jan 02, 1946 (77 y.o. 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 [name] Primary Care Torrez Renfroe: [name] Other Clinician: Referring Crockett Rallo: Treating Elaysha Bevard/Extender: [name] in Treatment: 0 Height (in): 68 Weight (lbs): 200 Body Mass Index (BMI): 30.4 Nutrition Risk Screening Items Score Screening NUTRITION RISK SCREEN: I have an illness or condition that made me change the kind and/or amount of food I eat 2 Yes I eat fewer than two meals per day 0 No I eat few fruits and vegetables, or milk products 0 No I have three or more drinks of beer, liquor or wine almost every day 0 No I have tooth or mouth problems that make it hard for me to eat 0 No I don't always have enough money to buy the food I need 0 No JAMAI, FEST J ([id number]) [phone] [id number] Page 5 of 5 I eat alone most of the time 0 No I take three or more different prescribed or over-the-counter drugs a day 1 Yes Without wanting to, I have lost or gained 10 pounds in the last six months 0 No I am not always physically able to shop, cook and/or feed myself 0 No Nutrition Protocols Good Risk Protocol Moderate Risk Protocol 0 Provide education on nutrition High Risk Proctocol Risk Level: Moderate  Risk Score: 3 Electronic Signature(s) Signed: 04/28/2023 12:58:51 PM By: [name] Entered By: Yevonne Pax on 04/22/2023 11:38:23

## 2023-04-28 NOTE — Progress Notes (Signed)
[name], [name] ([id number]) [id number] Page 1 of 8 Visit Report for 04/22/2023 Chief Complaint Document Details Patient Name: Date of Service: [name], [name] 04/22/2023 11:30 A M Medical Record Number: [id number] Patient Account Number: [id number] Date of Birth/Sex: Treating RN: Jan 01, 1946 (77 y.o. [name] Primary [name] Provider: [name] Other Clinician: Referring Provider: Treating Provider/Extender: [name] in Treatment: 0 Information Obtained from: Patient Chief Complaint Left LE Ulcer Electronic Signature(s) Signed: 04/22/2023 11:57:37 AM By: Allen Derry PA-C Entered By: Allen Derry on 04/22/2023 11:57:37 -------------------------------------------------------------------------------- Debridement Details Patient Name: Date of Service: [name]. 04/22/2023 11:30 A M Medical Record Number: [id number] Patient Account Number: [id number] Date of Birth/Sex: Treating RN: 11-21-1945 (77 y.o. [name] Primary [name] Provider: [name] Other Clinician: Referring Provider: Treating Provider/Extender: [name] in Treatment: 0 Debridement Performed for Assessment: Wound #1 Left,Distal,Lateral Lower Leg Performed By: Physician Allen Derry, PA-C Debridement Type: Debridement Severity of Tissue Pre Debridement: Fat layer exposed Level of Consciousness (Pre-procedure): Awake and Alert Pre-procedure Verification/Time Out Yes - 12:05 Taken: Start Time: 12:05 Percent of Wound Bed Debrided: 100% T Area Debrided (cm): otal 0.35 Tissue and other material debrided: Viable, Non-Viable, Slough, Subcutaneous, Skin: Dermis , Skin: Epidermis, Slough Level: Skin/Subcutaneous Tissue Debridement Description: Excisional Instrument: Curette Bleeding: Minimum Hemostasis Achieved: Pressure End Time: 12:12 Procedural Pain: 0 Post Procedural Pain: 0 [name], [name]  ([id number]) [id number] Page 2 of 8 Response to Treatment: Procedure was tolerated well Level of Consciousness (Post- Awake and Alert procedure): Post Debridement Measurements of Total Wound Length: (cm) 0.5 Width: (cm) 0.9 Depth: (cm) 0.3 Volume: (cm) 0.106 Character of Wound/Ulcer Post Debridement: Improved Severity of Tissue Post Debridement: Fat layer exposed Post Procedure Diagnosis Same as Pre-procedure Electronic Signature(s) Signed: 04/22/2023 1:44:39 PM By: Allen Derry PA-C Signed: 04/28/2023 12:58:51 PM By: [name] Entered By: Yevonne Pax on 04/22/2023 12:10:51 -------------------------------------------------------------------------------- HPI Details Patient Name: Date of Service: [name], [name]. 04/22/2023 11:30 A M Medical Record Number: [id number] Patient Account Number: [id number] Date of Birth/Sex: Treating RN: 09-30-1946 (77 y.o. [name] Primary [name] Provider: [name] Other Clinician: Referring Provider: Treating Provider/Extender: [name] in Treatment: 0 History of Present Illness HPI Description: 04-22-2023 upon evaluation patient appears to be doing somewhat poorly in regard to a wound on the left distal/lateral lower extremity and about the ankle region. She tells me this occurred on February 07, 2023 as a result of trying to get a very tight and new compression sock off. It was getting stuck and she somewhat twisted trying to get it off and pulled skin off. Since that time she has been having a hard time getting this to heal. Fortunately there does not appear to be any signs of active infection which is great news. She did have an ABI of 0.75 today. With that being said I am actually decently pleased with how the wound looks it does have some necrotic tissue but there is going to need to be some sharp debridement to clear away some of the dead tissue as well today. Patient does  have a history of diabetes mellitus type 2, lymphedema, hypertension, and chronic venous insufficiency. Electronic Signature(s) Signed: 04/22/2023 1:35:49 PM By: Allen Derry PA-C Entered By: Allen Derry on 04/22/2023 13:35:49 Physical Exam Details -------------------------------------------------------------------------------- [name] ([id number]) [id number] Page 3 of 8 Patient Name: Date of Service: [name], [name] 04/22/2023 11:30 A M Medical Record Number: [id number]  Patient Account Number: [id number] Date of Birth/Sex: Treating RN: Oct 19, 1946 (77 y.o. [name] Primary [name] Provider: [name] Other Clinician: Referring Provider: Treating Provider/Extender: [name] in Treatment: 0 Constitutional patient is hypertensive.. pulse regular and within target range for patient.Marland Kitchen respirations regular, non-labored and within target range for patient.Marland Kitchen temperature within target range for patient.. Well-nourished and well-hydrated in no acute distress. Eyes conjunctiva clear no eyelid edema noted. pupils equal round and reactive to light and accommodation. Ears, Nose, Mouth, and Throat no gross abnormality of ear auricles or external auditory canals. normal hearing noted during conversation. mucus membranes moist. Respiratory normal breathing without difficulty. Cardiovascular 1+ dorsalis pedis/posterior tibialis pulses. 1+ pitting edema of the bilateral lower extremities. Musculoskeletal normal gait and posture. no significant deformity or arthritic changes, no loss or range of motion, no clubbing. Psychiatric this patient is able to make decisions and demonstrates good insight into disease process. Alert and Oriented x 3. pleasant and cooperative. Notes Upon inspection patient's wound bed actually did require some sharp debridement clearway necrotic debris she tolerated this with some discomfort I was  trying to be as careful as possible as far as cleaning this way and in general we were able to get the dead tissue away and she did appear to be much better postdebridement. With that being said I do believe that she is going to require some compression therapy to try to get this moving in the right direction. Electronic Signature(s) Signed: 04/22/2023 1:36:22 PM By: Allen Derry PA-C Entered By: Allen Derry on 04/22/2023 13:36:22 -------------------------------------------------------------------------------- Physician Orders Details Patient Name: Date of Service: [name]. 04/22/2023 11:30 A M Medical Record Number: [id number] Patient Account Number: [id number] Date of Birth/Sex: Treating RN: 11-Nov-1945 (76 y.o. [name] Primary [name] Provider: [name] Other Clinician: Referring Provider: Treating Provider/Extender: [name] in Treatment: 0 Verbal / Phone Orders: No Diagnosis Coding ICD-10 Coding Code Description [phone] Chronic venous hypertension (idiopathic) with ulcer and inflammation of left lower extremity E11.622 Type 2 diabetes mellitus with other skin ulcer I89.0 Lymphedema, not elsewhere classified L97.822 Non-pressure chronic ulcer of other part of left lower leg with fat layer exposed I10 Essential (primary) hypertension Follow-up Appointments Return Appointment in 1 week. KEYONIA, DEDEAUX ([id number]) [id number] Page 4 of 8 Bathing/ Shower/ Hygiene May shower; gently cleanse wound with antibacterial soap, rinse and pat dry prior to dressing wounds Edema Control - Lymphedema / Segmental Compressive Device / Other Patient to wear own compression stockings. Remove compression stockings every night before going to bed and put on every morning when getting up. Elevate, Exercise Daily and A void Standing for Long Periods of Time. Elevate legs to the level of the heart and pump ankles as often as  possible Elevate leg(s) parallel to the floor when sitting. Wound Treatment Wound #1 - Lower Leg Wound Laterality: Left, Lateral, Distal Cleanser: Byram Ancillary Kit - 15 Day Supply (DME) (Generic) 3 x Per Week/30 Days Discharge Instructions: Use supplies as instructed; Kit contains: (15) Saline Bullets; (15) 3x3 Gauze; 15 pr Gloves Prim Dressing: Prisma 4.34 (in) 3 x Per Week/30 Days ary Discharge Instructions: Moisten w/normal saline or sterile water; Cover wound as directed. Do not remove from wound bed. Secondary Dressing: (BORDER) Zetuvit Plus SILICONE BORDER Dressing 4x4 (in/in) (DME) (Generic) 3 x Per Week/30 Days Discharge Instructions: Please do not put silicone bordered dressings under wraps. Use non-bordered dressing only. Electronic Signature(s) Signed: 04/22/2023 1:44:39 PM By: Allen Derry PA-C  Signed: 04/28/2023 12:58:51 PM By: [name] Entered By: Yevonne Pax on 04/22/2023 12:13:48 -------------------------------------------------------------------------------- Problem List Details Patient Name: Date of Service: [name], [name]. 04/22/2023 11:30 A M Medical Record Number: [id number] Patient Account Number: [id number] Date of Birth/Sex: Treating RN: Jan 16, 1946 (77 y.o. 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 04/22/2023 11:30 A M Medical Record Number: [id number] Patient Account Number: [id number] Date of Birth/Sex: Treating RN: 19-May-1946 (77 y.o. [name] Primary [name] Provider: [name] Other Clinician: Referring Provider: Treating Provider/Extender: [name] in Treatment: 0 Subjective Chief Complaint Information obtained from Patient Left LE Ulcer History of Present Illness (HPI) 04-22-2023 upon evaluation patient appears to be doing somewhat poorly in regard to a wound on the left distal/lateral lower extremity and about the ankle region. She tells me this occurred on February 07, 2023 as a result of trying to get a very tight and new compression sock off. It was getting stuck and she somewhat twisted trying to get it off and pulled skin off. Since that time she has been having a hard time getting this to heal. Fortunately there does not appear to be any signs of active infection which is great news. She did have an ABI of 0.75 today. With that being said I am actually decently pleased with how the wound looks it does have some necrotic tissue but there is going to need to be some sharp debridement to clear away some of the dead tissue as well today. Patient does have a history of diabetes mellitus type 2, lymphedema, hypertension, and chronic venous insufficiency. Patient History Allergies Augmentin, codeine, metformin Social History Former smoker, Marital Status - Married, Alcohol Use - Never, Drug Use - No History, Caffeine Use - Never. Medical History Hematologic/Lymphatic Patient has  history of Lymphedema Cardiovascular Patient has history of Hypertension Endocrine Patient has history of Type II Diabetes Patient is treated with Oral Agents. Review of Systems (ROS) Integumentary (Skin) Complains or has symptoms of Wounds. [address] [name], [name] ([id number]) [id number] Page 6 of 8 Constitutional patient is hypertensive.. pulse regular and within target range for patient.Marland Kitchen respirations regular, non-labored and within target range for patient.Marland Kitchen temperature within target range for patient.. Well-nourished and well-hydrated in no acute distress. Vitals Time Taken: 11:31 AM, Height: 68 in, Source: Stated, Weight: 200 lbs, Source: Stated, BMI: 30.4, Temperature: 98.2 F, Pulse: 78 bpm, Respiratory Rate: 18 breaths/min, Blood Pressure: 160/73 mmHg. Eyes conjunctiva clear no eyelid edema noted. pupils equal round and reactive to light and accommodation. Ears, Nose, Mouth, and Throat no gross abnormality of ear auricles or external auditory canals. normal hearing noted during conversation. mucus membranes moist. Respiratory normal breathing without difficulty. Cardiovascular 1+ dorsalis pedis/posterior tibialis pulses. 1+ pitting edema  of the bilateral lower extremities. Musculoskeletal normal gait and posture. no significant deformity or arthritic changes, no loss or range of motion, no clubbing. Psychiatric this patient is able to make decisions and demonstrates good insight into disease process. Alert and Oriented x 3. pleasant and cooperative. General Notes: Upon inspection patient's wound bed actually did require some sharp debridement clearway necrotic debris she tolerated this with some discomfort I was trying to be as careful as possible as far as cleaning this way and in general we were able to get the dead tissue away and she did appear to be much better postdebridement. With that being said I do believe that she is going to require  some compression therapy to try to get this moving in the right direction. Integumentary (Hair, Skin) Wound #1 status is Open. Original cause of wound was Trauma. The date acquired was: 02/07/2023. The wound is located on the Left,Distal,Lateral Lower Leg. The wound measures 0.5cm length x 0.9cm width x 0.3cm depth; 0.353cm^2 area and 0.106cm^3 volume. There is Fat Layer (Subcutaneous Tissue) exposed. There is no tunneling or undermining noted. There is a medium amount of serosanguineous drainage noted. There is medium (34-66%) red granulation within the wound bed. There is a medium (34-66%) amount of necrotic tissue within the wound bed including Adherent Slough. Assessment Active Problems ICD-10 Chronic venous hypertension (idiopathic) with ulcer and inflammation of left lower extremity Type 2 diabetes mellitus with other skin ulcer Lymphedema, not elsewhere classified Non-pressure chronic ulcer of other part of left lower leg with fat layer exposed Essential (primary) hypertension Procedures Wound #1 Pre-procedure diagnosis of Wound #1 is a Diabetic Wound/Ulcer of the Lower Extremity located on the Left,Distal,Lateral Lower Leg .Severity of Tissue Pre Debridement is: Fat layer exposed. There was a Excisional Skin/Subcutaneous Tissue Debridement with a total area of 0.35 sq cm performed by Allen Derry, PA-C. With the following instrument(s): Curette to remove Viable and Non-Viable tissue/material. Material removed includes Subcutaneous Tissue, Slough, Skin: Dermis, and Skin: Epidermis. No specimens were taken. A time out was conducted at 12:05, prior to the start of the procedure. A Minimum amount of bleeding was controlled with Pressure. The procedure was tolerated well with a pain level of 0 throughout and a pain level of 0 following the procedure. Post Debridement Measurements: 0.5cm length x 0.9cm width x 0.3cm depth; 0.106cm^3 volume. Character of Wound/Ulcer Post Debridement is improved.  Severity of Tissue Post Debridement is: Fat layer exposed. Post procedure Diagnosis Wound #1: Same as Pre-Procedure Plan Follow-up Appointments: Return Appointment in 1 week. Bathing/ Shower/ Hygiene: May shower; gently cleanse wound with antibacterial soap, rinse and pat dry prior to dressing wounds Edema Control - Lymphedema / Segmental Compressive Device / Other: Patient to wear own compression stockings. Remove compression stockings every night before going to bed and put on every morning when getting up. Elevate, Exercise Daily and Avoid Standing for Long Periods of Time. Elevate legs to the level of the heart and pump ankles as often as possible Elevate leg(s) parallel to the floor when sitting. WOUND #1: - Lower Leg Wound Laterality: Left, Lateral, Distal Cleanser: Byram Ancillary Kit - 15 Day Supply (DME) (Generic) 3 x Per Week/30 Days [name], [name] ([id number]) [id number] Page 7 of 8 Discharge Instructions: Use supplies as instructed; Kit contains: (15) Saline Bullets; (15) 3x3 Gauze; 15 pr Gloves Prim Dressing: Prisma 4.34 (in) 3 x Per Week/30 Days ary Discharge Instructions: Moisten w/normal saline or sterile water; Cover wound as directed. Do not remove  from wound bed. Secondary Dressing: (BORDER) Zetuvit Plus SILICONE BORDER Dressing 4x4 (in/in) (DME) (Generic) 3 x Per Week/30 Days Discharge Instructions: Please do not put silicone bordered dressings under wraps. Use non-bordered dressing only. 1. I am recommend that we have the patient continue to monitor for any signs of infection or worsening. Based on what I am seeing I think that we are on the right track here. 2. I am going to suggest as well that we initiate treatment with the silver collagen dressing. 3. I am also going to recommend that we continue with the Zetuvit bordered foam dressing. The patient really does not want to try to avoid wrapping her leg where therefore can have her use  strong compression socks and we will see how things go over the next week. We will see patient back for reevaluation in 1 week here in the clinic. If anything worsens or changes patient will contact our office for additional recommendations. Electronic Signature(s) Signed: 04/22/2023 1:37:21 PM By: Allen Derry PA-C Entered By: Allen Derry on 04/22/2023 13:37:21 -------------------------------------------------------------------------------- ROS/PFSH Details Patient Name: Date of Service: [name], [name]. 04/22/2023 11:30 A M Medical Record Number: [id number] Patient Account Number: [id number] Date of Birth/Sex: Treating RN: 03-Nov-1946 (77 y.o. [name] Primary [name] Provider: [name] Other Clinician: Referring Provider: Treating Provider/Extender: [name] in Treatment: 0 Integumentary (Skin) Complaints and Symptoms: Positive for: Wounds Hematologic/Lymphatic Medical History: Positive for: Lymphedema Cardiovascular Medical History: Positive for: Hypertension Endocrine Medical History: Positive for: Type II Diabetes Treated with: Oral agents Immunizations Pneumococcal Vaccine: Received Pneumococcal Vaccination: No Implantable Devices None Family and Social History Former smoker; Marital Status - Married; Alcohol Use: Never; Drug Use: No History; Caffeine Use: Never [name], [name] ([id number]) [id number].pdf Page 8 of 8 Electronic Signature(s) Signed: 04/22/2023 1:44:39 PM By: Allen Derry PA-C Signed: 04/28/2023 12:58:51 PM By: [name] Entered By: Yevonne Pax on 04/22/2023 11:35:33 -------------------------------------------------------------------------------- SuperBill Details Patient Name: Date of Service: [name], Nestor Ramp. 04/22/2023 Medical Record Number: [id number] Patient Account Number: [id number] Date of Birth/Sex: Treating RN: 1946-05-07 (77 y.o. [name] Primary [name]  Provider: [name] Other Clinician: Referring Provider: Treating Provider/Extender: [name] in Treatment: 0 Diagnosis Coding ICD-10 Codes Code Description [phone] Chronic venous hypertension (idiopathic) with ulcer and inflammation of left lower extremity E11.622 Type 2 diabetes mellitus with other skin ulcer I89.0 Lymphedema, not elsewhere classified L97.822 Non-pressure chronic ulcer of other part of left lower leg with fat layer exposed I10 Essential (primary) hypertension Facility Procedures : CPT4 Code: [id number] Description: 99213 - WOUND [name] VISIT-LEV 3 EST PT Modifier: Quantity: 1 : CPT4 Code: [id number] Description: 11042 - DEB SUBQ TISSUE 20 SQ CM/< ICD-10 Diagnosis Description L97.822 Non-pressure chronic ulcer of other part of left lower leg with fat layer expos Modifier: ed Quantity: 1 Physician Procedures : CPT4 Code Description Modifier [id number] WC PHYS LEVEL 3 NEW PT 25 ICD-10 Diagnosis Description I87.332 Chronic venous hypertension (idiopathic) with ulcer and inflammation of left lower extremity E11.622 Type 2 diabetes mellitus with other skin ulcer  I89.0 Lymphedema, not elsewhere classified L97.822 Non-pressure chronic ulcer of other part of left lower leg with fat layer exposed Quantity: 1 : [id number] 11042 - WC PHYS SUBQ TISS 20 SQ CM ICD-10 Diagnosis Description L97.822 Non-pressure chronic ulcer of other part of left lower leg with fat layer exposed Quantity: 1 Electronic Signature(s) Signed: 04/22/2023 1:38:03 PM By: Allen Derry PA-C Entered By: Allen Derry on 04/22/2023 13:38:02

## 2023-05-02 ENCOUNTER — Encounter: Payer: Federal, State, Local not specified - PPO | Admitting: Physician Assistant

## 2023-05-02 DIAGNOSIS — E11622 Type 2 diabetes mellitus with other skin ulcer: Secondary | ICD-10-CM | POA: Diagnosis not present

## 2023-05-02 NOTE — Progress Notes (Signed)
MURRELL, ELIZONDO ([id number]) [id number] Page 1 of 6 Visit Report for 05/02/2023 Chief Complaint Document Details Patient Name: Date of Service: [name], Christine 05/02/2023 11:00 A M Medical Record Number: [id number] Patient Account Number: [id number] Date of Birth/Sex: Treating RN: 1946-08-31 (77 y.o. [name] Primary Care Provider: [name] Other Clinician: Referring Provider: Treating Provider/Extender: [name] in Treatment: 1 Information Obtained from: Patient Chief Complaint Left LE Ulcer Electronic Signature(s) Signed: 05/02/2023 11:08:27 AM By: Allen Derry PA-C Entered By: Allen Derry on 05/02/2023 11:08:27 -------------------------------------------------------------------------------- HPI Details Patient Name: Date of Service: Christine Saxon J. 05/02/2023 11:00 A M Medical Record Number: [id number] Patient Account Number: [id number] Date of Birth/Sex: Treating RN: November 04, 1946 (77 y.o. [name] Primary Care Provider: [name] Other Clinician: Referring Provider: Treating Provider/Extender: [name] in Treatment: 1 History of Present Illness HPI Description: 04-22-2023 upon evaluation patient appears to be doing somewhat poorly in regard to a wound on the left distal/lateral lower extremity and about the ankle region. She tells me this occurred on February 07, 2023 as a result of trying to get a very tight and new compression sock off. It was getting stuck and she somewhat twisted trying to get it off and pulled skin off. Since that time she has been having a hard time getting this to heal. Fortunately there does not appear to be any signs of active infection which is great news. She did have an ABI of 0.75 today. With that being said I am actually decently pleased with how the wound looks it does have some necrotic tissue but there is going to need to be some sharp  debridement to clear away some of the dead tissue as well today. Patient does have a history of diabetes mellitus type 2, lymphedema, hypertension, and chronic venous insufficiency. 05-02-2023 upon evaluation today patient appears to be doing well currently in regard to her wound. She is actually showing signs of improvement and very pleased in that regard and fortunately I do not see any signs of active infection locally or systemically at this time which is great news. No fevers, chills, nausea, vomiting, or diarrhea. Electronic Signature(s) Signed: 05/02/2023 2:25:39 PM By: Wilnette Kales, [place] J [phone] By: Dionne Ano [phone].pdf Page 2 of 6 Signed: 05/02/2023 2:25:39 Entered By: Allen Derry on 05/02/2023 14:25:39 -------------------------------------------------------------------------------- Physical Exam Details Patient Name: Date of Service: [name], Christine 05/02/2023 11:00 A M Medical Record Number: [id number] Patient Account Number: [id number] Date of Birth/Sex: Treating RN: 12-Nov-1945 (77 y.o. [name] Primary Care Provider: [name] Other Clinician: Referring Provider: Treating Provider/Extender: [name] in Treatment: 1 Constitutional Well-nourished and well-hydrated in no acute distress. Respiratory normal breathing without difficulty. Psychiatric this patient is able to make decisions and demonstrates good insight into disease process. Alert and Oriented x 3. pleasant and cooperative. Notes Upon inspection patient's wound bed actually showed signs of good granulation position at this point. Fortunately I do not see anything that seems to be worsening and in general I do think that we are moving in the right direction here. No sharp debridement was performed today that we will keep a close eye on this. Electronic Signature(s) Signed: 05/02/2023 2:25:58 PM By: Allen Derry  PA-C Entered By: Allen Derry on 05/02/2023 14:25:58 -------------------------------------------------------------------------------- Physician Orders Details Patient Name: Date of Service: Christine Saxon J. 05/02/2023 11:00 A M Medical Record Number: [id number] Patient Account Number: [id number] Date  of Birth/Sex: Treating RN: 1946-04-08 (77 y.o. [name] Primary Care Provider: [name] Other Clinician: Referring Provider: Treating Provider/Extender: [name] in Treatment: 1 Verbal / Phone Orders: No Diagnosis Coding ICD-10 Coding Code Description [phone] Chronic venous hypertension (idiopathic) with ulcer and inflammation of left lower extremity E11.622 Type 2 diabetes mellitus with other skin ulcer I89.0 Lymphedema, not elsewhere classified DAISEY, CALOCA ([id number]) [id number] Page 3 of 6 [phone] Non-pressure chronic ulcer of other part of left lower leg with fat layer exposed I10 Essential (primary) hypertension Follow-up Appointments Return Appointment in 1 week. Bathing/ Shower/ Hygiene May shower; gently cleanse wound with antibacterial soap, rinse and pat dry prior to dressing wounds Edema Control - Lymphedema / Segmental Compressive Device / Other Patient to wear own compression stockings. Remove compression stockings every night before going to bed and put on every morning when getting up. Elevate, Exercise Daily and A void Standing for Long Periods of Time. Elevate legs to the level of the heart and pump ankles as often as possible Elevate leg(s) parallel to the floor when sitting. Wound Treatment Wound #1 - Lower Leg Wound Laterality: Left, Lateral, Distal Cleanser: Byram Ancillary Kit - 15 Day Supply (Generic) 3 x Per Week/30 Days Discharge Instructions: Use supplies as instructed; Kit contains: (15) Saline Bullets; (15) 3x3 Gauze; 15 pr Gloves Prim Dressing: Prisma 4.34 (in) 3 x Per Week/30  Days ary Discharge Instructions: Moisten w/normal saline or sterile water; Cover wound as directed. Do not remove from wound bed. Secondary Dressing: (BORDER) Zetuvit Plus SILICONE BORDER Dressing 4x4 (in/in) (Generic) 3 x Per Week/30 Days Discharge Instructions: Please do not put silicone bordered dressings under wraps. Use non-bordered dressing only. Electronic Signature(s) Signed: 05/02/2023 5:56:33 PM By: Allen Derry PA-C Signed: 05/04/2023 11:52:51 AM By: [name] Entered By: Yevonne Pax on 05/02/2023 11:10:34 -------------------------------------------------------------------------------- Problem List Details Patient Name: Date of Service: [name], [name] J. 05/02/2023 11:00 A M Medical Record Number: [id number] Patient Account Number: [id number] Date of Birth/Sex: Treating RN: Sep 24, 1946 (77 y.o. 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 05/02/2023 11:00 A M Medical  Record Number: [id number] Patient Account Number: [id number] Date of Birth/Sex: Treating RN: 08-25-1946 (77 y.o. [name] Primary Care Provider: [name] Other Clinician: Referring Provider: Treating Provider/Extender: [name] in Treatment: 1 Subjective Chief Complaint Information obtained from Patient Left LE Ulcer History of Present Illness (HPI) 04-22-2023 upon evaluation patient appears to be doing somewhat poorly in regard to a wound on the left distal/lateral lower extremity and about the ankle region. She tells me this occurred on February 07, 2023 as a result of trying to get a very tight and new compression sock off. It was getting stuck and she  somewhat twisted trying to get it off and pulled skin off. Since that time she has been having a hard time getting this to heal. Fortunately there does not appear to be any signs of active infection which is great news. She did have an ABI of 0.75 today. With that being said I am actually decently pleased with how the wound looks it does have some necrotic tissue but there is going to need to be some sharp debridement to clear away some of the dead tissue as well today. Patient does have a history of diabetes mellitus type 2, lymphedema, hypertension, and chronic venous insufficiency. 05-02-2023 upon evaluation today patient appears to be doing well currently in regard to her wound. She is actually showing signs of improvement and very pleased in that regard and fortunately I do not see any signs of active infection locally or systemically at this time which is great news. No fevers, chills, nausea, vomiting, or diarrhea. Objective Constitutional Well-nourished and well-hydrated in no acute distress. Vitals Time Taken: 10:56 AM, Height: 68 in, Weight: 200 lbs, BMI: 30.4, Temperature: 98.1 F, Pulse: 73 bpm, Respiratory Rate: 18 breaths/min, Blood Pressure: 147/68 mmHg. Respiratory normal breathing  without difficulty. Psychiatric BIDDIE, SEBEK ([id number]) [id number] Page 5 of 6 this patient is able to make decisions and demonstrates good insight into disease process. Alert and Oriented x 3. pleasant and cooperative. General Notes: Upon inspection patient's wound bed actually showed signs of good granulation position at this point. Fortunately I do not see anything that seems to be worsening and in general I do think that we are moving in the right direction here. No sharp debridement was performed today that we will keep a close eye on this. Integumentary (Hair, Skin) Wound #1 status is Open. Original cause of wound was Trauma. The date acquired was: 02/07/2023. The wound has been in treatment 1 weeks. The wound is located on the Left,Distal,Lateral Lower Leg. The wound measures 0.6cm length x 0.9cm width x 0.2cm depth; 0.424cm^2 area and 0.085cm^3 volume. There is Fat Layer (Subcutaneous Tissue) exposed. There is no tunneling or undermining noted. There is a medium amount of serosanguineous drainage noted. There is medium (34-66%) red granulation within the wound bed. There is a medium (34-66%) amount of necrotic tissue within the wound bed including Adherent Slough. Assessment Active Problems ICD-10 Chronic venous hypertension (idiopathic) with ulcer and inflammation of left lower extremity Type 2 diabetes mellitus with other skin ulcer Lymphedema, not elsewhere classified Non-pressure chronic ulcer of other part of left lower leg with fat layer exposed Essential (primary) hypertension Plan Follow-up Appointments: Return Appointment in 1 week. Bathing/ Shower/ Hygiene: May shower; gently cleanse wound with antibacterial soap, rinse and pat dry prior to dressing wounds Edema Control - Lymphedema / Segmental Compressive Device / Other: Patient to wear own compression stockings. Remove compression stockings every night before going to bed and put on every  morning when getting up. Elevate, Exercise Daily and Avoid Standing for Long Periods of Time. Elevate legs to the level of the heart and pump ankles as often as possible Elevate leg(s) parallel to the floor when sitting. WOUND #1: - Lower Leg Wound Laterality: Left, Lateral, Distal Cleanser: Byram Ancillary Kit - 15 Day Supply (Generic) 3 x Per Week/30 Days Discharge Instructions: Use supplies as instructed; Kit contains: (15) Saline Bullets; (15) 3x3 Gauze; 15 pr Gloves Prim Dressing: Prisma 4.34 (in) 3 x Per Week/30 Days ary Discharge Instructions: Moisten w/normal saline or sterile water; Cover wound as  directed. Do not remove from wound bed. Secondary Dressing: (BORDER) Zetuvit Plus SILICONE BORDER Dressing 4x4 (in/in) (Generic) 3 x Per Week/30 Days Discharge Instructions: Please do not put silicone bordered dressings under wraps. Use non-bordered dressing only. #1 I would recommend that we have the patient continue to monitor infection or worsening. Obviously if anything changes she knows contact the office let me know. 2. I am also can recommend the patient should continue to utilize the silver collagen which I think is really doing a good job here. I am hopeful that she will continue to see improvement week by week for any signs and hopefully will get to complete closure within a short amount of time. We will see patient back for reevaluation in 1 week here in the clinic. If anything worsens or changes patient will contact our office for additional recommendations. Electronic Signature(s) Signed: 05/02/2023 2:26:32 PM By: Allen Derry PA-C Entered By: Allen Derry on 05/02/2023 14:26:32 -------------------------------------------------------------------------------- SuperBill Details Patient Name: Date of Service: [address] [name] 05/02/2023 Gayla Doss ([id number]) [id number] Page 6 of 6 Medical Record Number: [id number] Patient Account Number:  [id number] Date of Birth/Sex: Treating RN: Mar 23, 1946 (77 y.o. [name] Primary Care Provider: [name] Other Clinician: Referring Provider: Treating Provider/Extender: [name] in Treatment: 1 Diagnosis Coding ICD-10 Codes Code Description [phone] Chronic venous hypertension (idiopathic) with ulcer and inflammation of left lower extremity E11.622 Type 2 diabetes mellitus with other skin ulcer I89.0 Lymphedema, not elsewhere classified L97.822 Non-pressure chronic ulcer of other part of left lower leg with fat layer exposed I10 Essential (primary) hypertension Facility Procedures : CPT4 Code: [id number] Description: 62952 - WOUND CARE VISIT-LEV 2 EST PT Modifier: Quantity: 1 Physician Procedures : CPT4 Code Description Modifier [id number] - WC PHYS LEVEL 3 - EST PT ICD-10 Diagnosis Description I87.332 Chronic venous hypertension (idiopathic) with ulcer and inflammation of left lower extremity E11.622 Type 2 diabetes mellitus with other skin  ulcer I89.0 Lymphedema, not elsewhere classified L97.822 Non-pressure chronic ulcer of other part of left lower leg with fat layer exposed Quantity: 1 Electronic Signature(s) Signed: 05/02/2023 5:10:54 PM By: [name] Signed: 05/02/2023 5:56:33 PM By: Allen Derry PA-C Previous Signature: 05/02/2023 2:27:06 PM Version By: Allen Derry PA-C Entered By: Yevonne Pax on 05/02/2023 17:10:53

## 2023-05-04 NOTE — Progress Notes (Signed)
[name], [name] ([id number]) [phone].pdf Page 1 of 8 Visit Report for 05/02/2023 Arrival Information Details Patient Name: Date of Service: [name], [name] 05/02/2023 11:00 A M Medical Record Number: [id number] Patient Account Number: [id number] Date of Birth/Sex: Treating RN: Jun 22, 1946 (77 y.o. [name] Primary [name] Idabelle Mcpeters: [name] Other Clinician: Referring Samoria Fedorko: Treating Pasty Manninen/Extender: [name] in Treatment: 1 Visit Information History Since Last Visit Added or deleted any medications: No Patient Arrived: Ambulatory Any new allergies or adverse reactions: No Arrival Time: 10:51 Had a fall or experienced change in No Accompanied By: self activities of daily living that may affect Transfer Assistance: None risk of falls: Patient Identification Verified: Yes Signs or symptoms of abuse/neglect since last visito No Secondary Verification Process Completed: Yes Hospitalized since last visit: No Patient Requires Transmission-Based Precautions: No Implantable device outside of the clinic excluding No Patient Has Alerts: No cellular tissue based products placed in the center since last visit: Has Dressing in Place as Prescribed: Yes Pain Present Now: No Electronic Signature(s) Signed: 05/04/2023 11:52:51 AM By: [name] Entered By: Yevonne Pax on 05/02/2023 10:56:37 -------------------------------------------------------------------------------- Clinic Level of [name] Assessment Details Patient Name: Date of Service: [name], [name] 05/02/2023 11:00 A M Medical Record Number: [id number] Patient Account Number: [id number] Date of Birth/Sex: Treating RN: 11-Nov-1945 (77 y.o. [name] Primary [name] Geni Skorupski: [name] Other Clinician: Referring Ameenah Prosser: Treating Jaiel Saraceno/Extender: [name] in Treatment: 1 Clinic Level of [name] Assessment Items TOOL  4 Quantity Score X- 1 0 Use when only an EandM is performed on FOLLOW-UP visit ASSESSMENTS - Nursing Assessment / Reassessment X- 1 10 Reassessment of Co-morbidities (includes updates in patient status) X- 1 5 Reassessment of Adherence to Treatment Plan DALEEN, STEINHAUS ([id number]) [phone].pdf Page 2 of 8 ASSESSMENTS - Wound and Skin A ssessment / Reassessment X - Simple Wound Assessment / Reassessment - one wound 1 5 []  - 0 Complex Wound Assessment / Reassessment - multiple wounds []  - 0 Dermatologic / Skin Assessment (not related to wound area) ASSESSMENTS - Focused Assessment []  - 0 Circumferential Edema Measurements - multi extremities []  - 0 Nutritional Assessment / Counseling / Intervention []  - 0 Lower Extremity Assessment (monofilament, tuning fork, pulses) []  - 0 Peripheral Arterial Disease Assessment (using hand held doppler) ASSESSMENTS - Ostomy and/or Continence Assessment and [name] []  - 0 Incontinence Assessment and Management []  - 0 Ostomy [name] Assessment and Management (repouching, etc.) PROCESS - Coordination of [name] X - Simple Patient / Family Education for ongoing [name] 1 15 []  - 0 Complex (extensive) Patient / Family Education for ongoing [name] []  - 0 Staff obtains Chiropractor, Records, T Results / Process Orders est []  - 0 Staff telephones HHA, Nursing Homes / Clarify orders / etc []  - 0 Routine Transfer to another Facility (non-emergent condition) []  - 0 Routine Hospital Admission (non-emergent condition) []  - 0 New Admissions / Manufacturing engineer / Ordering NPWT Apligraf, etc. , []  - 0 Emergency Hospital Admission (emergent condition) X- 1 10 Simple Discharge Coordination []  - 0 Complex (extensive) Discharge Coordination PROCESS - Special Needs []  - 0 Pediatric / Minor Patient Management []  - 0 Isolation Patient Management []  - 0 Hearing / Language / Visual special needs []  - 0 Assessment of Community  assistance (transportation, D/C planning, etc.) []  - 0 Additional assistance / Altered mentation []  - 0 Support Surface(s) Assessment (bed, cushion, seat, etc.) INTERVENTIONS - Wound Cleansing / Measurement X - Simple Wound  Cleansing - one wound 1 5 []  - 0 Complex Wound Cleansing - multiple wounds X- 1 5 Wound Imaging (photographs - any number of wounds) []  - 0 Wound Tracing (instead of photographs) X- 1 5 Simple Wound Measurement - one wound []  - 0 Complex Wound Measurement - multiple wounds INTERVENTIONS - Wound Dressings X - Small Wound Dressing one or multiple wounds 1 10 []  - 0 Medium Wound Dressing one or multiple wounds []  - 0 Large Wound Dressing one or multiple wounds []  - 0 Application of Medications - topical []  - 0 Application of Medications - injection INTERVENTIONS - Miscellaneous []  - 0 External ear exam [name], [name] ([id number]) [phone].pdf Page 3 of 8 []  - 0 Specimen Collection (cultures, biopsies, blood, body fluids, etc.) []  - 0 Specimen(s) / Culture(s) sent or taken to Lab for analysis []  - 0 Patient Transfer (multiple staff / Michiel Sites Lift / Similar devices) []  - 0 Simple Staple / Suture removal (25 or less) []  - 0 Complex Staple / Suture removal (26 or more) []  - 0 Hypo / Hyperglycemic Management (close monitor of Blood Glucose) []  - 0 Ankle / Brachial Index (ABI) - do not check if billed separately X- 1 5 Vital Signs Has the patient been seen at the hospital within the last three years: Yes Total Score: 75 Level Of [name]: New/Established - Level 2 Electronic Signature(s) Signed: 05/04/2023 11:52:51 AM By: [name] Entered By: Yevonne Pax on 05/02/2023 17:10:38 -------------------------------------------------------------------------------- Lower Extremity Assessment Details Patient Name: Date of Service: [name], [name] 05/02/2023 11:00 A M Medical Record Number: [id number] Patient Account Number:  [id number] Date of Birth/Sex: Treating RN: 1946-09-06 (77 y.o. [name] Primary [name] Jasiyah Poland: [name] Other Clinician: Referring Kelia Gibbon: Treating Katryn Plummer/Extender: [name] in Treatment: 1 Edema Assessment Assessed: [Left: No] [Right: No] Edema: [Left: Ye] [Right: s] Calf Left: Right: Point of Measurement: 35 cm From Medial Instep 38 cm Ankle Left: Right: Point of Measurement: 10 cm From Medial Instep 23 cm Knee To Floor Left: Right: From Medial Instep 45 cm Vascular Assessment Pulses: Dorsalis Pedis Palpable: [Left:No] Electronic Signature(s) Signed: 05/04/2023 11:52:51 AM By: [name] Entered By: Yevonne Pax on 05/02/2023 11:00:18 Gayla Doss ([id number]) [id number] Page 4 of 8 -------------------------------------------------------------------------------- Multi Wound Chart Details Patient Name: Date of Service: [name], [name] 05/02/2023 11:00 A M Medical Record Number: [id number] Patient Account Number: [id number] Date of Birth/Sex: Treating RN: November 05, 1946 (77 y.o. [name] Primary [name] Markham Dumlao: [name] Other Clinician: Referring Aurore Redinger: Treating Treydon Henricks/Extender: [name] in Treatment: 1 Vital Signs Height(in): 68 Pulse(bpm): 73 Weight(lbs): 200 Blood Pressure(mmHg): 147/68 Body Mass Index(BMI): 30.4 Temperature(F): 98.1 Respiratory Rate(breaths/min): 18 [1:Photos:] [N/A:N/A] Left, Distal, Lateral Lower Leg N/A N/A Wound Location: Trauma N/A N/A Wounding Event: Diabetic Wound/Ulcer of the Lower N/A N/A Primary Etiology: Extremity Lymphedema, Hypertension, Type II N/A N/A Comorbid History: Diabetes 02/07/2023 N/A N/A Date Acquired: 1 N/A N/A Weeks of Treatment: Open N/A N/A Wound Status: No N/A N/A Wound Recurrence: 0.6x0.9x0.2 N/A N/A Measurements L x W x D (cm) 0.424 N/A N/A A (cm) : rea 0.085 N/A  N/A Volume (cm) : -20.10% N/A N/A % Reduction in A rea: 19.80% N/A N/A % Reduction in Volume: Grade 1 N/A N/A Classification: Medium N/A N/A Exudate A mount: Serosanguineous N/A N/A Exudate Type: red, brown N/A N/A Exudate Color: Medium (34-66%) N/A N/A Granulation A mount: Red N/A N/A Granulation Quality: Medium (34-66%) N/A N/A  Necrotic A mount: Fat Layer (Subcutaneous Tissue): Yes N/A N/A Exposed Structures: Fascia: No Tendon: No Muscle: No Joint: No Bone: No None N/A N/A Epithelialization: Treatment Notes Electronic Signature(s) Signed: 05/02/2023 5:09:29 PM By: [name] Entered By: Yevonne Pax on 05/02/2023 17:09:29 Roy, Nestor Ramp ([id number]) [id number] Page 5 of 8 -------------------------------------------------------------------------------- Multi-Disciplinary [name] Plan Details Patient Name: Date of Service: [name], [name] 05/02/2023 11:00 A M Medical Record Number: [id number] Patient Account Number: [id number] Date of Birth/Sex: Treating RN: [birth year] (77 y.o. [name] Primary [name] [place] Hair: [name] Other Clinician: Referring Gagan Dillion: Treating Elsye Mccollister/Extender: [name] in Treatment: 1 Active Inactive Necrotic Tissue Nursing Diagnoses: Knowledge deficit related to management of necrotic/devitalized tissue Goals: Patient/caregiver will verbalize understanding of reason and process for debridement of necrotic tissue Date Initiated: 04/22/2023 Target Resolution Date: 05/22/2023 Goal Status: Active Interventions: Assess patient pain level pre-, during and post procedure and prior to discharge Notes: Wound/Skin Impairment Nursing Diagnoses: Knowledge deficit related to ulceration/compromised skin integrity Goals: Patient/caregiver will verbalize understanding of skin [name] regimen Date Initiated: 04/22/2023 Target Resolution Date: 05/22/2023 Goal Status:  Active Ulcer/skin breakdown will have a volume reduction of 30% by week 4 Date Initiated: 04/22/2023 Target Resolution Date: 05/22/2023 Goal Status: Active Ulcer/skin breakdown will have a volume reduction of 50% by week 8 Date Initiated: 04/22/2023 Target Resolution Date: 06/22/2023 Goal Status: Active Ulcer/skin breakdown will have a volume reduction of 80% by week 12 Date Initiated: 04/22/2023 Target Resolution Date: 07/23/2023 Goal Status: Active Ulcer/skin breakdown will heal within 14 weeks Date Initiated: 04/22/2023 Target Resolution Date: 08/22/2023 Goal Status: Active Interventions: Assess patient/caregiver ability to obtain necessary supplies Assess patient/caregiver ability to perform ulcer/skin [name] regimen upon admission and as needed Assess ulceration(s) every visit Notes: Electronic Signature(s) Signed: 05/04/2023 11:52:51 AM By: [name] Entered By: Yevonne Pax on 05/02/2023 11:00:32 Gayla Doss ([id number]) [id number] Page 6 of 8 -------------------------------------------------------------------------------- Pain Assessment Details Patient Name: Date of Service: [name], [name] 05/02/2023 11:00 A M Medical Record Number: [id number] Patient Account Number: [id number] Date of Birth/Sex: Treating RN: Nov 25, 1945 (77 y.o. [name] Primary [name] Leveda Kendrix: [name] Other Clinician: Referring Benigno Check: Treating Danford Tat/Extender: [name] in Treatment: 1 Active Problems Location of Pain Severity and Description of Pain Patient Has Paino No Site Locations Pain Management and Medication Current Pain Management: Electronic Signature(s) Signed: 05/04/2023 11:52:51 AM By: [name] Entered By: Yevonne Pax on 05/02/2023 10:57:06 -------------------------------------------------------------------------------- Patient/Caregiver Education Details Patient Name: Date of Service: [name] 6/24/2024andnbsp11:00 A M Medical Record Number: [id number] Patient Account Number: [id number] Date of Birth/Gender: Treating RN: 1945-11-30 (77 y.o. [name] Primary [name] Physician: [name] Other Clinician: Referring Physician: Treating Physician/Extender: [name] in Treatment: 1 Hadassah, Rana Nestor Ramp ([id number]) [id number] Page 7 of 8 Education Assessment Education Provided To: Patient Education Topics Provided Wound/Skin Impairment: Handouts: Caring for Your Ulcer Methods: Explain/Verbal Responses: State content correctly Electronic Signature(s) Signed: 05/04/2023 11:52:51 AM By: [name] Entered By: Yevonne Pax on 05/02/2023 11:00:44 -------------------------------------------------------------------------------- Wound Assessment Details Patient Name: Date of Service: [name], [name] 05/02/2023 11:00 A M Medical Record Number: [id number] Patient Account Number: [id number] Date of Birth/Sex: Treating RN: Feb 05, 1946 (77 y.o. [name] Primary [name] Tramayne Sebesta: [name] Other Clinician: Referring Treshun Wold: Treating Leslieanne Cobarrubias/Extender: [name] in Treatment: 1 Wound Status Wound Number: 1 Primary Etiology: Diabetic Wound/Ulcer of the Lower Extremity Wound Location: Left, Distal, Lateral  Lower Leg Wound Status: Open Wounding Event: Trauma Comorbid History: Lymphedema, Hypertension, Type II Diabetes Date Acquired: 02/07/2023 Weeks Of Treatment: 1 Clustered Wound: No Photos Wound Measurements Length: (cm) 0.6 Width: (cm) 0.9 Depth: (cm) 0.2 Area: (cm) 0.424 Volume: (cm) 0.085 % Reduction in Area: -20.1% % Reduction in Volume: 19.8% Epithelialization: None Tunneling: No Undermining: No Wound Description Classification: Grade 1 Exudate Amount: Medium Exudate Type: Serosanguineous [name], Christine J ([id number]) Exudate Color: red,  brown Foul Odor After Cleansing: No Slough/Fibrino Yes [phone].pdf Page 8 of 8 Wound Bed Granulation Amount: Medium (34-66%) Exposed Structure Granulation Quality: Red Fascia Exposed: No Necrotic Amount: Medium (34-66%) Fat Layer (Subcutaneous Tissue) Exposed: Yes Necrotic Quality: Adherent Slough Tendon Exposed: No Muscle Exposed: No Joint Exposed: No Bone Exposed: No Electronic Signature(s) Signed: 05/04/2023 11:52:51 AM By: [name] Entered By: Yevonne Pax on 05/02/2023 10:59:56 -------------------------------------------------------------------------------- Vitals Details Patient Name: Date of Service: [name], [name] J. 05/02/2023 11:00 A M Medical Record Number: [id number] Patient Account Number: [id number] Date of Birth/Sex: Treating RN: [birth year] (77 y.o. [name] Primary [name] Derenda Giddings: [name] Other Clinician: Referring Jalaine Riggenbach: Treating Ta Fair/Extender: [name] in Treatment: 1 Vital Signs Time Taken: 10:56 Temperature (F): 98.1 Height (in): 68 Pulse (bpm): 73 Weight (lbs): 200 Respiratory Rate (breaths/min): 18 Body Mass Index (BMI): 30.4 Blood Pressure (mmHg): 147/68 Reference Range: 80 - 120 mg / dl Electronic Signature(s) Signed: 05/04/2023 11:52:51 AM By: [name] Entered By: Yevonne Pax on 05/02/2023 10:56:55

## 2023-05-09 ENCOUNTER — Encounter: Payer: Federal, State, Local not specified - PPO | Attending: Physician Assistant | Admitting: Physician Assistant

## 2023-05-09 DIAGNOSIS — I87332 Chronic venous hypertension (idiopathic) with ulcer and inflammation of left lower extremity: Secondary | ICD-10-CM | POA: Diagnosis not present

## 2023-05-09 DIAGNOSIS — I10 Essential (primary) hypertension: Secondary | ICD-10-CM | POA: Diagnosis not present

## 2023-05-09 DIAGNOSIS — E669 Obesity, unspecified: Secondary | ICD-10-CM | POA: Diagnosis not present

## 2023-05-09 DIAGNOSIS — Z6830 Body mass index (BMI) 30.0-30.9, adult: Secondary | ICD-10-CM | POA: Diagnosis not present

## 2023-05-09 DIAGNOSIS — L97822 Non-pressure chronic ulcer of other part of left lower leg with fat layer exposed: Secondary | ICD-10-CM | POA: Diagnosis not present

## 2023-05-09 DIAGNOSIS — I890 Lymphedema, not elsewhere classified: Secondary | ICD-10-CM | POA: Diagnosis not present

## 2023-05-09 DIAGNOSIS — E11622 Type 2 diabetes mellitus with other skin ulcer: Secondary | ICD-10-CM | POA: Diagnosis present

## 2023-05-09 NOTE — Progress Notes (Signed)
THOMESHA, RICKNER ([id number]) [id number] Page 1 of 6 Visit Report for 05/09/2023 Chief Complaint Document Details Patient Name: Date of Service: [name], [name] 05/09/2023 8:45 A M Medical Record Number: [id number] Patient Account Number: [id number] Date of Birth/Sex: Treating RN: 1946-03-17 (77 y.o. [name] Primary Care Provider: [name] Other Clinician: Referring Provider: Treating Provider/Extender: [name] in Treatment: 2 Information Obtained from: Patient Chief Complaint Left LE Ulcer Electronic Signature(s) Signed: 05/09/2023 9:08:56 AM By: Allen Derry PA-C Entered By: Allen Derry on 05/09/2023 09:08:56 -------------------------------------------------------------------------------- HPI Details Patient Name: Date of Service: [name]. 05/09/2023 8:45 A M Medical Record Number: [id number] Patient Account Number: [id number] Date of Birth/Sex: Treating RN: 08-09-1946 (77 y.o. [name] Primary Care Provider: [name] Other Clinician: Referring Provider: Treating Provider/Extender: [name] in Treatment: 2 History of Present Illness HPI Description: 04-22-2023 upon evaluation patient appears to be doing somewhat poorly in regard to a wound on the left distal/lateral lower extremity and about the ankle region. She tells me this occurred on February 07, 2023 as a result of trying to get a very tight and new compression sock off. It was getting stuck and she somewhat twisted trying to get it off and pulled skin off. Since that time she has been having a hard time getting this to heal. Fortunately there does not appear to be any signs of active infection which is great news. She did have an ABI of 0.75 today. With that being said I am actually decently pleased with how the wound looks it does have some necrotic tissue but there is going to need to be some sharp  debridement to clear away some of the dead tissue as well today. Patient does have a history of diabetes mellitus type 2, lymphedema, hypertension, and chronic venous insufficiency. 05-02-2023 upon evaluation today patient appears to be doing well currently in regard to her wound. She is actually showing signs of improvement and very pleased in that regard and fortunately I do not see any signs of active infection locally or systemically at this time which is great news. No fevers, chills, nausea, vomiting, or diarrhea. 05-09-2023 upon evaluation today patient appears to be doing well currently in regard to her wound is not worse but is also not better. I discussed with her again today that we will get a need to likely compression wrap for her. With that being said she is adamantly opposed to this and wants to get 1 more week given something to try and I recommend to be seen by double layer Tubigrip to see if this can be beneficial. ROAA, ATTIG ([id number]) [id number] Page 2 of 6 Electronic Signature(s) Signed: 05/09/2023 9:34:58 AM By: Allen Derry PA-C Entered By: Allen Derry on 05/09/2023 09:34:57 -------------------------------------------------------------------------------- Physical Exam Details Patient Name: Date of Service: [name], [name] 05/09/2023 8:45 A M Medical Record Number: [id number] Patient Account Number: [id number] Date of Birth/Sex: Treating RN: 02-19-1946 (77 y.o. [name] Primary Care Provider: [name] Other Clinician: Referring Provider: Treating Provider/Extender: [name] in Treatment: 2 Constitutional Well-nourished and well-hydrated in no acute distress. Respiratory normal breathing without difficulty. Psychiatric this patient is able to make decisions and demonstrates good insight into disease process. Alert and Oriented x 3. pleasant and cooperative. Notes Upon inspection patient's  wound bed actually showed signs of good granulation and some areas although is not much smaller I do not think there is any  signs of infection which is great news. Electronic Signature(s) Signed: 05/09/2023 9:36:00 AM By: Allen Derry PA-C Entered By: Allen Derry on 05/09/2023 09:35:59 -------------------------------------------------------------------------------- Physician Orders Details Patient Name: Date of Service: [name]. 05/09/2023 8:45 A M Medical Record Number: [id number] Patient Account Number: [id number] Date of Birth/Sex: Treating RN: 1946/04/23 (77 y.o. [name] Primary Care Provider: [name] Other Clinician: Referring Provider: Treating Provider/Extender: [name] in Treatment: 2 Verbal / Phone Orders: No Diagnosis Coding ICD-10 Coding Code Description [phone] Chronic venous hypertension (idiopathic) with ulcer and inflammation of left lower extremity ANAILY, HELOU ([id number]) [id number] Page 3 of 6 E11.622 Type 2 diabetes mellitus with other skin ulcer I89.0 Lymphedema, not elsewhere classified L97.822 Non-pressure chronic ulcer of other part of left lower leg with fat layer exposed I10 Essential (primary) hypertension Follow-up Appointments Return Appointment in 1 week. Bathing/ Shower/ Hygiene May shower; gently cleanse wound with antibacterial soap, rinse and pat dry prior to dressing wounds Edema Control - Lymphedema / Segmental Compressive Device / Other Patient to wear own compression stockings. Remove compression stockings every night before going to bed and put on every morning when getting up. Elevate, Exercise Daily and A void Standing for Long Periods of Time. Elevate legs to the level of the heart and pump ankles as often as possible Elevate leg(s) parallel to the floor when sitting. Wound Treatment Wound #1 - Lower Leg Wound Laterality: Left, Lateral, Distal Cleanser:  Byram Ancillary Kit - 15 Day Supply (Generic) 3 x Per Week/30 Days Discharge Instructions: Use supplies as instructed; Kit contains: (15) Saline Bullets; (15) 3x3 Gauze; 15 pr Gloves Prim Dressing: Prisma 4.34 (in) 3 x Per Week/30 Days ary Discharge Instructions: Moisten w/normal saline or sterile water; Cover wound as directed. Do not remove from wound bed. Secondary Dressing: (BORDER) Zetuvit Plus SILICONE BORDER Dressing 4x4 (in/in) (Generic) 3 x Per Week/30 Days Discharge Instructions: Please do not put silicone bordered dressings under wraps. Use non-bordered dressing only. Secured With: Tubigrip Size C, 2.75x10 (in/yd) 3 x Per Week/30 Days Discharge Instructions: double layer Electronic Signature(s) Signed: 05/09/2023 3:10:51 PM By: Allen Derry PA-C Signed: 05/10/2023 3:15:47 PM By: [name] Entered By: Yevonne Pax on 05/09/2023 09:25:28 -------------------------------------------------------------------------------- Problem List Details Patient Name: Date of Service: [name]. 05/09/2023 8:45 A M Medical Record Number: [id number] Patient Account Number: [id number] Date of Birth/Sex: Treating RN: [birth year] (77 y.o. 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 05/09/2023 8:45 A M Medical Record Number: [id number] Patient Account Number: [id number] Date of Birth/Sex: Treating RN: 1946-04-19 (77 y.o. [name] Primary Care Provider: [name] Other Clinician: Referring Provider: Treating Provider/Extender: [name] in Treatment: 2 Subjective Chief Complaint Information obtained  from Patient Left LE Ulcer History of Present Illness (HPI) 04-22-2023 upon evaluation patient appears to be doing somewhat poorly in regard to a wound on the left distal/lateral lower extremity and about the ankle region. She tells me this occurred on February 07, 2023 as a result of trying to get a very tight and new compression sock off. It was getting stuck and she somewhat twisted trying to get it off and pulled skin off. Since that time she has been having a hard time getting this to heal. Fortunately there does not appear to be any signs of active infection which is great news. She did have an ABI of 0.75 today. With that being said I am actually decently pleased with how the wound looks it does have some necrotic tissue but there is going to need to be some sharp debridement to clear away some of the dead tissue as well today. Patient does have a history of diabetes mellitus type 2, lymphedema, hypertension, and chronic venous insufficiency. 05-02-2023 upon evaluation today patient appears to be doing well currently in regard to her wound. She is actually showing signs of improvement and very pleased in that regard and fortunately I do not see any signs of active infection locally or systemically at this time which is great news. No  fevers, chills, nausea, vomiting, or diarrhea. 05-09-2023 upon evaluation today patient appears to be doing well currently in regard to her wound is not worse but is also not better. I discussed with her again today that we will get a need to likely compression wrap for her. With that being said she is adamantly opposed to this and wants to get 1 more week given something to try and I recommend to be seen by double layer Tubigrip to see if this can be beneficial. Objective IZZABEL, DUMIRE ([id number]) [id number] Page 5 of 6 Constitutional Well-nourished and well-hydrated in no acute distress. Vitals Time Taken: 8:51 AM, Height: 68 in, Weight: 200 lbs, BMI: 30.4, Temperature: 97.8 F, Pulse: 81 bpm, Respiratory Rate: 18 breaths/min, Blood Pressure: 176/73 mmHg. Respiratory normal breathing without difficulty. Psychiatric this patient is able to make decisions and demonstrates good insight into disease process. Alert and Oriented x 3. pleasant and cooperative. General Notes: Upon inspection patient's wound bed actually showed signs of good granulation and some areas although is not much smaller I do not think there is any signs of infection which is great news. Integumentary (Hair, Skin) Wound #1 status is Open. Original cause of wound was Trauma. The date acquired was: 02/07/2023. The wound has been in treatment 2 weeks. The wound is located on the Left,Distal,Lateral Lower Leg. The wound measures 0.5cm length x 0.9cm width x 0.2cm depth; 0.353cm^2 area and 0.071cm^3 volume. There is Fat Layer (Subcutaneous Tissue) exposed. There is no tunneling or undermining noted. There is a medium amount of serosanguineous drainage noted. There is medium (34-66%) red granulation within the wound bed. There is a medium (34-66%) amount of necrotic tissue within the wound bed including Adherent Slough. Assessment Active Problems ICD-10 Chronic venous hypertension (idiopathic) with  ulcer and inflammation of left lower extremity Type 2 diabetes mellitus with other skin ulcer Lymphedema, not elsewhere classified Non-pressure chronic ulcer of other part of left lower leg with fat layer exposed Essential (primary) hypertension Plan Follow-up Appointments: Return Appointment in 1 week. Bathing/ Shower/ Hygiene: May shower; gently cleanse wound with antibacterial soap, rinse and pat dry prior to dressing wounds Edema Control - Lymphedema / Segmental Compressive  Device / Other: Patient to wear own compression stockings. Remove compression stockings every night before going to bed and put on every morning when getting up. Elevate, Exercise Daily and Avoid Standing for Long Periods of Time. Elevate legs to the level of the heart and pump ankles as often as possible Elevate leg(s) parallel to the floor when sitting. WOUND #1: - Lower Leg Wound Laterality: Left, Lateral, Distal Cleanser: Byram Ancillary Kit - 15 Day Supply (Generic) 3 x Per Week/30 Days Discharge Instructions: Use supplies as instructed; Kit contains: (15) Saline Bullets; (15) 3x3 Gauze; 15 pr Gloves Prim Dressing: Prisma 4.34 (in) 3 x Per Week/30 Days ary Discharge Instructions: Moisten w/normal saline or sterile water; Cover wound as directed. Do not remove from wound bed. Secondary Dressing: (BORDER) Zetuvit Plus SILICONE BORDER Dressing 4x4 (in/in) (Generic) 3 x Per Week/30 Days Discharge Instructions: Please do not put silicone bordered dressings under wraps. Use non-bordered dressing only. Secured With: Tubigrip Size C, 2.75x10 (in/yd) 3 x Per Week/30 Days Discharge Instructions: double layer 1. Based on what I am seeing I do believe that the patient is going to need to go into a compression wrap next week if she does not see improvement this week with that being said I would recommend a double layer Tubigrip she is to keep this on at all times. 2. Based on what I am seeing I am recommending to continue  with the collagen I think this is still a good option we do seem to make sure that we have everything moving in the right direction. We will see patient back for reevaluation in 1 week here in the clinic. If anything worsens or changes patient will contact our office for additional recommendations. Electronic Signature(s) Signed: 05/09/2023 9:36:06 AM By: Allen Derry PA-C Entered By: Allen Derry on 05/09/2023 09:36:06 Koeppen, Nestor Ramp ([id number]) [id number] Page 6 of 6 -------------------------------------------------------------------------------- SuperBill Details Patient Name: Date of Service: [name], [name] 05/09/2023 Medical Record Number: [id number] Patient Account Number: [id number] Date of Birth/Sex: Treating RN: 03-16-1946 (77 y.o. [name] Primary Care Provider: [name] Other Clinician: Referring Provider: Treating Provider/Extender: [name] in Treatment: 2 Diagnosis Coding ICD-10 Codes Code Description [phone] Chronic venous hypertension (idiopathic) with ulcer and inflammation of left lower extremity E11.622 Type 2 diabetes mellitus with other skin ulcer I89.0 Lymphedema, not elsewhere classified L97.822 Non-pressure chronic ulcer of other part of left lower leg with fat layer exposed I10 Essential (primary) hypertension Facility Procedures : CPT4 Code: [id number] Description: 84696 - WOUND CARE VISIT-LEV 2 EST PT Modifier: Quantity: 1 Physician Procedures : CPT4 Code Description Modifier [id number] - WC PHYS LEVEL 3 - EST PT ICD-10 Diagnosis Description I87.332 Chronic venous hypertension (idiopathic) with ulcer and inflammation of left lower extremity E11.622 Type 2 diabetes mellitus with other skin  ulcer I89.0 Lymphedema, not elsewhere classified L97.822 Non-pressure chronic ulcer of other part of left lower leg with fat layer exposed Quantity: 1 Electronic Signature(s) Signed: 05/09/2023  9:36:29 AM By: Allen Derry PA-C Entered By: Allen Derry on 05/09/2023 09:36:29

## 2023-05-10 NOTE — Progress Notes (Signed)
[name], [name] ([id number]) [id number] Page 1 of 9 Visit Report for 05/09/2023 Arrival Information Details Patient Name: Date of Service: [name] 05/09/2023 8:45 A M Medical Record Number: [id number] Patient Account Number: [id number] Date of Birth/Sex: Treating RN: 1945-11-29 (77 y.o. [name] Primary [name] [name]: [name] Other Clinician: Referring [name]: Treating [name]/Extender: [name] in Treatment: 2 Visit Information History Since Last Visit Added or deleted any medications: No Patient Arrived: Ambulatory Any new allergies or adverse reactions: No Arrival Time: 08:50 Had a fall or experienced change in No Accompanied By: self activities of daily living that may affect Transfer Assistance: None risk of falls: Patient Identification Verified: Yes Signs or symptoms of abuse/neglect since last visito No Secondary Verification Process Completed: Yes Hospitalized since last visit: No Patient Requires Transmission-Based Precautions: No Implantable device outside of the clinic excluding No Patient Has Alerts: No cellular tissue based products placed in the center since last visit: Has Dressing in Place as Prescribed: Yes Pain Present Now: No Electronic Signature(s) Signed: 05/10/2023 3:15:47 PM By: [name] Entered By: Yevonne Pax on 05/09/2023 08:51:12 -------------------------------------------------------------------------------- Clinic Level of [name] Assessment Details Patient Name: Date of Service: [name] 05/09/2023 8:45 A M Medical Record Number: [id number] Patient Account Number: [id number] Date of Birth/Sex: Treating RN: [birth year] (77 y.o. [name] Primary [name] [name]: [name] Other Clinician: Referring [name]: Treating [name]/Extender: [name] in Treatment: 2 Clinic Level of [name] Assessment Items TOOL 4  Quantity Score X- 1 0 Use when only an EandM is performed on FOLLOW-UP visit ASSESSMENTS - Nursing Assessment / Reassessment X- 1 10 Reassessment of Co-morbidities (includes updates in patient status) X- 1 5 Reassessment of Adherence to Treatment Plan [name], [name] ([id number]) [id number] Page 2 of 9 ASSESSMENTS - Wound and Skin A ssessment / Reassessment X - Simple Wound Assessment / Reassessment - one wound 1 5 []  - 0 Complex Wound Assessment / Reassessment - multiple wounds []  - 0 Dermatologic / Skin Assessment (not related to wound area) ASSESSMENTS - Focused Assessment []  - 0 Circumferential Edema Measurements - multi extremities []  - 0 Nutritional Assessment / Counseling / Intervention []  - 0 Lower Extremity Assessment (monofilament, tuning fork, pulses) []  - 0 Peripheral Arterial Disease Assessment (using hand held doppler) ASSESSMENTS - Ostomy and/or Continence Assessment and [name] []  - 0 Incontinence Assessment and Management []  - 0 Ostomy [name] Assessment and Management (repouching, etc.) PROCESS - Coordination of [name] X - Simple Patient / Family Education for ongoing [name] 1 15 []  - 0 Complex (extensive) Patient / Family Education for ongoing [name] []  - 0 Staff obtains Chiropractor, Records, T Results / Process Orders est []  - 0 Staff telephones HHA, Nursing Homes / Clarify orders / etc []  - 0 Routine Transfer to another Facility (non-emergent condition) []  - 0 Routine Hospital Admission (non-emergent condition) []  - 0 New Admissions / Manufacturing engineer / Ordering NPWT Apligraf, etc. , []  - 0 Emergency Hospital Admission (emergent condition) X- 1 10 Simple Discharge Coordination []  - 0 Complex (extensive) Discharge Coordination PROCESS - Special Needs []  - 0 Pediatric / Minor Patient Management []  - 0 Isolation Patient Management []  - 0 Hearing / Language / Visual special needs []  - 0 Assessment of Community assistance  (transportation, D/C planning, etc.) []  - 0 Additional assistance / Altered mentation []  - 0 Support Surface(s) Assessment (bed, cushion, seat, etc.) INTERVENTIONS - Wound Cleansing / Measurement X - Simple Wound  Cleansing - one wound 1 5 []  - 0 Complex Wound Cleansing - multiple wounds X- 1 5 Wound Imaging (photographs - any number of wounds) []  - 0 Wound Tracing (instead of photographs) X- 1 5 Simple Wound Measurement - one wound []  - 0 Complex Wound Measurement - multiple wounds INTERVENTIONS - Wound Dressings X - Small Wound Dressing one or multiple wounds 1 10 []  - 0 Medium Wound Dressing one or multiple wounds []  - 0 Large Wound Dressing one or multiple wounds []  - 0 Application of Medications - topical []  - 0 Application of Medications - injection INTERVENTIONS - Miscellaneous []  - 0 External ear exam [name], [name] ([id number]) [id number] Page 3 of 9 []  - 0 Specimen Collection (cultures, biopsies, blood, body fluids, etc.) []  - 0 Specimen(s) / Culture(s) sent or taken to Lab for analysis []  - 0 Patient Transfer (multiple staff / Michiel Sites Lift / Similar devices) []  - 0 Simple Staple / Suture removal (25 or less) []  - 0 Complex Staple / Suture removal (26 or more) []  - 0 Hypo / Hyperglycemic Management (close monitor of Blood Glucose) []  - 0 Ankle / Brachial Index (ABI) - do not check if billed separately X- 1 5 Vital Signs Has the patient been seen at the hospital within the last three years: Yes Total Score: 75 Level Of [name]: New/Established - Level 2 Electronic Signature(s) Signed: 05/10/2023 3:15:47 PM By: [name] Entered By: Yevonne Pax on 05/09/2023 09:22:36 -------------------------------------------------------------------------------- Encounter Discharge Information Details Patient Name: Date of Service: [name], [name] J. 05/09/2023 8:45 A M Medical Record Number: [id number] Patient Account Number:  [id number] Date of Birth/Sex: Treating RN: 01-Jul-1946 (77 y.o. [name] Primary [name] [name]: [name] Other Clinician: Referring [name]: Treating Christine Strick/Extender: [name] in Treatment: 2 Encounter Discharge Information Items Discharge Condition: Stable Ambulatory Status: Ambulatory Discharge Destination: Home Transportation: Private Auto Accompanied By: self Schedule Follow-up Appointment: Yes Clinical Summary of [name]: Electronic Signature(s) Signed: 05/10/2023 3:15:47 PM By: [name] Entered By: Yevonne Pax on 05/09/2023 09:24:01 -------------------------------------------------------------------------------- Lower Extremity Assessment Details Patient Name: Date of Service: [name], [name] 05/09/2023 8:45 A KAASHVI, PANZICA ([id number]) [phone].pdf Page 4 of 9 Medical Record Number: [id number] Patient Account Number: [id number] Date of Birth/Sex: Treating RN: 09/03/1946 (77 y.o. [name] Primary [name] Tkai Large: [name] Other Clinician: Referring Consuello Lassalle: Treating Verlisa Vara/Extender: [name] in Treatment: 2 Edema Assessment Assessed: [Left: No] [Right: No] Edema: [Left: Ye] [Right: s] Calf Left: Right: Point of Measurement: 35 cm From Medial Instep 39 cm Ankle Left: Right: Point of Measurement: 10 cm From Medial Instep 23 cm Knee To Floor Left: Right: From Medial Instep 45 cm Electronic Signature(s) Signed: 05/10/2023 3:15:47 PM By: [name] Entered By: Yevonne Pax on 05/09/2023 08:55:31 -------------------------------------------------------------------------------- Multi Wound Chart Details Patient Name: Date of Service: [name], [name] J. 05/09/2023 8:45 A M Medical Record Number: [id number] Patient Account Number: [id number] Date of Birth/Sex: Treating RN: 1946-05-24 (77 y.o. [name] Primary [name] Mycheal [place]: [name] Other Clinician: Referring Rosary Filosa: Treating Jayline Kilburg/Extender: [name] in Treatment: 2 Vital Signs Height(in): 68 Pulse(bpm): 81 Weight(lbs): 200 Blood Pressure(mmHg): 176/73 Body Mass Index(BMI): 30.4 Temperature(F): 97.8 Respiratory Rate(breaths/min): 18 [1:Photos:] [N/A:N/A] Left, Distal, Lateral Lower Leg N/A N/A Wound Location: Trauma N/A N/A Wounding Event: Diabetic Wound/Ulcer of the Lower N/A N/A Primary Etiology: Extremity Lymphedema, Hypertension, Type II N/A N/A Comorbid HistoryCORALINE, [name] ([id number]) [id number]  Page 5 of 9 Diabetes 02/07/2023 N/A N/A Date Acquired: 2 N/A N/A Weeks of Treatment: Open N/A N/A Wound Status: No N/A N/A Wound Recurrence: 0.5x0.9x0.2 N/A N/A Measurements L x W x D (cm) 0.353 N/A N/A A (cm) : rea 0.071 N/A N/A Volume (cm) : 0.00% N/A N/A % Reduction in A rea: 33.00% N/A N/A % Reduction in Volume: Grade 1 N/A N/A Classification: Medium N/A N/A Exudate A mount: Serosanguineous N/A N/A Exudate Type: red, brown N/A N/A Exudate Color: Medium (34-66%) N/A N/A Granulation A mount: Red N/A N/A Granulation Quality: Medium (34-66%) N/A N/A Necrotic A mount: Fat Layer (Subcutaneous Tissue): Yes N/A N/A Exposed Structures: Fascia: No Tendon: No Muscle: No Joint: No Bone: No None N/A N/A Epithelialization: Treatment Notes Electronic Signature(s) Signed: 05/10/2023 3:15:47 PM By: [name] Entered By: Yevonne Pax on 05/09/2023 08:55:36 -------------------------------------------------------------------------------- Multi-Disciplinary [name] Plan Details Patient Name: Date of Service: [name], [name] J. 05/09/2023 8:45 A M Medical Record Number: [id number] Patient Account Number: [id number] Date of Birth/Sex: Treating RN: 13-May-1946 (77 y.o. [name] Primary [name] Julyanna Scholle: [name] Other Clinician: Referring Duaa Stelzner: Treating  Dory Demont/Extender: [name] in Treatment: 2 Active Inactive Necrotic Tissue Nursing Diagnoses: Knowledge deficit related to management of necrotic/devitalized tissue Goals: Patient/caregiver will verbalize understanding of reason and process for debridement of necrotic tissue Date Initiated: 04/22/2023 Target Resolution Date: 05/22/2023 Goal Status: Active Interventions: Assess patient pain level pre-, during and post procedure and prior to discharge Notes: Wound/Skin Impairment Nursing Diagnoses: Knowledge deficit related to ulceration/compromised skin integrity [name], [name] ([id number]) [id number] Page 6 of 9 Goals: Patient/caregiver will verbalize understanding of skin [name] regimen Date Initiated: 04/22/2023 Target Resolution Date: 05/22/2023 Goal Status: Active Ulcer/skin breakdown will have a volume reduction of 30% by week 4 Date Initiated: 04/22/2023 Target Resolution Date: 05/22/2023 Goal Status: Active Ulcer/skin breakdown will have a volume reduction of 50% by week 8 Date Initiated: 04/22/2023 Target Resolution Date: 06/22/2023 Goal Status: Active Ulcer/skin breakdown will have a volume reduction of 80% by week 12 Date Initiated: 04/22/2023 Target Resolution Date: 07/23/2023 Goal Status: Active Ulcer/skin breakdown will heal within 14 weeks Date Initiated: 04/22/2023 Target Resolution Date: 08/22/2023 Goal Status: Active Interventions: Assess patient/caregiver ability to obtain necessary supplies Assess patient/caregiver ability to perform ulcer/skin [name] regimen upon admission and as needed Assess ulceration(s) every visit Notes: Electronic Signature(s) Signed: 05/10/2023 3:15:47 PM By: [name] Entered By: Yevonne Pax on 05/09/2023 09:23:09 -------------------------------------------------------------------------------- Pain Assessment Details Patient Name: Date of Service: [name], [name] 05/09/2023  8:45 A M Medical Record Number: [id number] Patient Account Number: [id number] Date of Birth/Sex: Treating RN: [birth year] (77 y.o. [name] Primary [name] Sai Zinn: [name] Other Clinician: Referring Kassy Mcenroe: Treating Rubyann Lingle/Extender: [name] in Treatment: 2 Active Problems Location of Pain Severity and Description of Pain Patient Has Paino No Site Locations Pain Management and Medication [name], [name] ([id number]) [id number] Page 7 of 9 Current Pain Management: Electronic Signature(s) Signed: 05/10/2023 3:15:47 PM By: [name] Entered By: Yevonne Pax on 05/09/2023 08:51:38 -------------------------------------------------------------------------------- Patient/Caregiver Education Details Patient Name: Date of Service: [name] 7/1/2024andnbsp8:45 A M Medical Record Number: [id number] Patient Account Number: [id number] Date of Birth/Gender: Treating RN: September 14, 1946 (77 y.o. [name] Primary [name] Physician: [name] Other Clinician: Referring Physician: Treating Physician/Extender: [name] in Treatment: 2 Education Assessment Education Provided To: Patient Education Topics Provided Wound/Skin Impairment: Handouts: Caring for Your Ulcer Methods: Explain/Verbal Responses:  State content correctly Electronic Signature(s) Signed: 05/10/2023 3:15:47 PM By: [name] Entered By: Yevonne Pax on 05/09/2023 09:23:26 -------------------------------------------------------------------------------- Wound Assessment Details Patient Name: Date of Service: [name], [name] 05/09/2023 8:45 A M Medical Record Number: [id number] Patient Account Number: [id number] Date of Birth/Sex: Treating RN: 05/07/1946 (77 y.o. [name] Primary [name] Azul Brumett: [name] Other Clinician: Referring Arnitra Sokoloski: Treating Aquita Simmering/Extender: [name] in Treatment: 2 Wound Status Wound Number: 1 Primary Etiology: Diabetic Wound/Ulcer of the Lower Extremity Wound Location: Left, Distal, Lateral Lower Leg Wound Status: Open Wounding Event: Trauma Comorbid History: Lymphedema, Hypertension, Type II Diabetes [name], [name] ([id number]) [id number] Page 8 of 9 Date Acquired: 02/07/2023 Weeks Of Treatment: 2 Clustered Wound: No Photos Wound Measurements Length: (cm) 0.5 Width: (cm) 0.9 Depth: (cm) 0.2 Area: (cm) 0.353 Volume: (cm) 0.071 % Reduction in Area: 0% % Reduction in Volume: 33% Epithelialization: None Tunneling: No Undermining: No Wound Description Classification: Grade 1 Exudate Amount: Medium Exudate Type: Serosanguineous Exudate Color: red, brown Foul Odor After Cleansing: No Slough/Fibrino Yes Wound Bed Granulation Amount: Medium (34-66%) Exposed Structure Granulation Quality: Red Fascia Exposed: No Necrotic Amount: Medium (34-66%) Fat Layer (Subcutaneous Tissue) Exposed: Yes Necrotic Quality: Adherent Slough Tendon Exposed: No Muscle Exposed: No Joint Exposed: No Bone Exposed: No Treatment Notes Wound #1 (Lower Leg) Wound Laterality: Left, Lateral, Distal Cleanser [place] Ancillary Kit - 15 Day Supply Discharge Instruction: Use supplies as instructed; Kit contains: (15) Saline Bullets; (15) 3x3 Gauze; 15 pr Gloves Peri-Wound [name] Topical Primary Dressing Prisma 4.34 (in) Discharge Instruction: Moisten w/normal saline or sterile water; Cover wound as directed. Do not remove from wound bed. Secondary Dressing (BORDER) Zetuvit Plus SILICONE BORDER Dressing 4x4 (in/in) Discharge Instruction: Please do not put silicone bordered dressings under wraps. Use non-bordered dressing only. Secured With Compression Wrap Compression Stockings Facilities manager) Signed: 05/10/2023 3:15:47 PM By: [name] Entered By: Yevonne Pax on  05/09/2023 08:54:25 Gayla Doss ([id number]) [id number] Page 9 of 9 -------------------------------------------------------------------------------- Vitals Details Patient Name: Date of Service: SAQQARA, NEMET 05/09/2023 8:45 A M Medical Record Number: [id number] Patient Account Number: [id number] Date of Birth/Sex: Treating RN: Apr 01, 1946 (77 y.o. [name] Primary [name] Nena Hampe: [name] Other Clinician: Referring Lenin Kuhnle: Treating Omega Durante/Extender: [name] in Treatment: 2 Vital Signs Time Taken: 08:51 Temperature (F): 97.8 Height (in): 68 Pulse (bpm): 81 Weight (lbs): 200 Respiratory Rate (breaths/min): 18 Body Mass Index (BMI): 30.4 Blood Pressure (mmHg): 176/73 Reference Range: 80 - 120 mg / dl Electronic Signature(s) Signed: 05/10/2023 3:15:47 PM By: [name] Entered By: Yevonne Pax on 05/09/2023 08:51:31

## 2023-05-16 ENCOUNTER — Encounter: Payer: Federal, State, Local not specified - PPO | Admitting: Physician Assistant

## 2023-05-16 DIAGNOSIS — E11622 Type 2 diabetes mellitus with other skin ulcer: Secondary | ICD-10-CM | POA: Diagnosis not present

## 2023-05-16 NOTE — Progress Notes (Signed)
[name], [name] ([id number]) [id number] Page 1 of 6 Visit Report for 05/16/2023 Chief Complaint Document Details Patient Name: Date of Service: [name] 05/16/2023 8:45 A M Medical Record Number: [id number] Patient Account Number: [id number] Date of Birth/Sex: Treating RN: 28-Oct-1946 (77 y.o. [name] Primary Care Provider: [name] Other Clinician: Referring Provider: Treating Provider/Extender: [name] in Treatment: 3 Information Obtained from: Patient Chief Complaint Left LE Ulcer Electronic Signature(s) Signed: 05/16/2023 9:33:33 AM By: Allen Derry PA-C Entered By: Allen Derry on 05/16/2023 09:33:33 -------------------------------------------------------------------------------- HPI Details Patient Name: Date of Service: Christine Slipper. 05/16/2023 8:45 A M Medical Record Number: [id number] Patient Account Number: [id number] Date of Birth/Sex: Treating RN: [birth year] (77 y.o. [name] Primary Care Provider: [name] Other Clinician: Referring Provider: Treating Provider/Extender: [name] in Treatment: 3 History of Present Illness HPI Description: 04-22-2023 upon evaluation patient appears to be doing somewhat poorly in regard to a wound on the left distal/lateral lower extremity and about the ankle region. She tells me this occurred on February 07, 2023 as a result of trying to get a very tight and new compression sock off. It was getting stuck and she somewhat twisted trying to get it off and pulled skin off. Since that time she has been having a hard time getting this to heal. Fortunately there does not appear to be any signs of active infection which is great news. She did have an ABI of 0.75 today. With that being said I am actually decently pleased with how the wound looks it does have some necrotic tissue but there is going to need to be some sharp  debridement to clear away some of the dead tissue as well today. Patient does have a history of diabetes mellitus type 2, lymphedema, hypertension, and chronic venous insufficiency. 05-02-2023 upon evaluation today patient appears to be doing well currently in regard to her wound. She is actually showing signs of improvement and very pleased in that regard and fortunately I do not see any signs of active infection locally or systemically at this time which is great news. No fevers, chills, nausea, vomiting, or diarrhea. 05-09-2023 upon evaluation today patient appears to be doing well currently in regard to her wound is not worse but is also not better. I discussed with her again today that we will get a need to likely compression wrap for her. With that being said she is adamantly opposed to this and wants to get 1 more week given something to try and I recommend to be seen by double layer Tubigrip to see if this can be beneficial. [name] ([id number]) [id number] Page 2 of 6 05-17-2023 upon evaluation today patient appears to be doing well currently in regard to her wound although it still very inflamed on the posterior aspect of her ankle. I discussed that with her today. With that being said I do not see any evidence of general worsening which is great news but also do not see a significant amount of improvement compared to last week in my opinion. I discussed this with her at this point today. Electronic Signature(s) Signed: 05/17/2023 6:52:50 PM By: Allen Derry PA-C Entered By: Allen Derry on 05/17/2023 18:52:50 -------------------------------------------------------------------------------- Physical Exam Details Patient Name: Date of Service: [name], [name] 05/16/2023 8:45 A M Medical Record Number: [id number] Patient Account Number: [id number] Date of Birth/Sex: Treating RN: [birth year] (77 y.o. [name] Primary Care Provider: [name]  Other Clinician: Referring Provider: Treating Provider/Extender: [name] in Treatment: 3 Constitutional Well-nourished and well-hydrated in no acute distress. Respiratory normal breathing without difficulty. Psychiatric this patient is able to make decisions and demonstrates good insight into disease process. Alert and Oriented x 3. pleasant and cooperative. Notes Upon inspection patient's wound bed did not really require any sharp debridement she was also having quite a bit of pain so that was avoided as of today I do not really see much we could remove anyway. With that being said there is still just a lot of formation appearing in this area unfortunately. Electronic Signature(s) Signed: 05/17/2023 6:53:11 PM By: Allen Derry PA-C Entered By: Allen Derry on 05/17/2023 18:53:11 -------------------------------------------------------------------------------- Physician Orders Details Patient Name: Date of Service: Christine Slipper. 05/16/2023 8:45 A M Medical Record Number: [id number] Patient Account Number: [id number] Date of Birth/Sex: Treating RN: 1945-11-24 (77 y.o. [name] Primary Care Provider: [name] Other Clinician: Referring Provider: Treating Provider/Extender: [name] in Treatment: 3 Verbal / Phone Orders: No Diagnosis Coding [name], [name] ([id number]) [id number] Page 3 of 6 ICD-10 Coding Code Description [phone] Chronic venous hypertension (idiopathic) with ulcer and inflammation of left lower extremity E11.622 Type 2 diabetes mellitus with other skin ulcer I89.0 Lymphedema, not elsewhere classified L97.822 Non-pressure chronic ulcer of other part of left lower leg with fat layer exposed I10 Essential (primary) hypertension Follow-up Appointments Return Appointment in 1 week. Bathing/ Shower/ Hygiene May shower; gently cleanse wound with antibacterial soap, rinse  and pat dry prior to dressing wounds Edema Control - Lymphedema / Segmental Compressive Device / Other Patient to wear own compression stockings. Remove compression stockings every night before going to bed and put on every morning when getting up. - bi lat Elevate, Exercise Daily and A void Standing for Long Periods of Time. Elevate legs to the level of the heart and pump ankles as often as possible Elevate leg(s) parallel to the floor when sitting. Wound Treatment Wound #1 - Lower Leg Wound Laterality: Left, Lateral, Distal Cleanser: Byram Ancillary Kit - 15 Day Supply (Generic) 3 x Per Week/30 Days Discharge Instructions: Use supplies as instructed; Kit contains: (15) Saline Bullets; (15) 3x3 Gauze; 15 pr Gloves Prim Dressing: Prisma 4.34 (in) 3 x Per Week/30 Days ary Discharge Instructions: Moisten w/normal saline or sterile water; Cover wound as directed. Do not remove from wound bed. Secondary Dressing: (BORDER) Zetuvit Plus SILICONE BORDER Dressing 4x4 (in/in) (Generic) 3 x Per Week/30 Days Discharge Instructions: Please do not put silicone bordered dressings under wraps. Use non-bordered dressing only. Electronic Signature(s) Signed: 05/16/2023 5:05:34 PM By: Allen Derry PA-C Signed: 05/19/2023 1:44:50 PM By: [name] Entered By: Yevonne Pax on 05/16/2023 09:37:50 -------------------------------------------------------------------------------- Problem List Details Patient Name: Date of Service: [name], [name]. 05/16/2023 8:45 A M Medical Record Number: [id number] Patient Account Number: [id number] Date of Birth/Sex: Treating RN: October 31, 1946 (78 y.o. 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 05/16/2023 8:45 A M Medical Record Number: [id number] Patient Account Number: [id number] Date of Birth/Sex: Treating RN: [birth year] (77 y.o. [name] Primary Care Provider: [name] Other Clinician: Referring Provider: Treating Provider/Extender: [name] in Treatment: 3 Subjective Chief Complaint Information obtained from Patient Left LE Ulcer History of Present Illness (HPI) 04-22-2023 upon evaluation patient appears to be doing somewhat poorly in regard to a wound on the left distal/lateral lower extremity and about the ankle region. She tells me this occurred on February 07, 2023 as a result of trying to get a very tight and new compression sock off. It was getting stuck and she somewhat twisted trying to get it off and pulled skin off. Since that time she has been having a hard time getting this to heal. Fortunately there does not appear to be any signs of active infection which is great news. She did have an ABI of 0.75 today. With that being said I am actually decently pleased with how the wound looks it does have some necrotic tissue but there is going to need to be some sharp debridement to clear away some of the  dead tissue as well today. Patient does have a history of diabetes mellitus type 2, lymphedema, hypertension, and chronic venous insufficiency. 05-02-2023 upon evaluation today patient appears to be doing well currently in regard to her wound. She is actually showing signs of improvement and very pleased in that regard and fortunately I do not see any signs of active infection locally or systemically at this time which is great news. No fevers, chills, nausea, vomiting, or diarrhea. 05-09-2023 upon evaluation today patient appears to be doing well currently in regard to her wound is not worse but is also not better. I discussed with her again today that we will get a need to likely compression wrap for her. With that being said she is adamantly opposed to this and wants to get 1 more week given something to try and I recommend to be seen by double layer Tubigrip to see if this can be beneficial. 05-17-2023 upon evaluation today patient appears to be doing well currently in regard to her wound although it still very inflamed on the posterior aspect of her ankle. I discussed that with her today. With that being said I do not see any evidence of general worsening which is great news but also do not see a significant amount of improvement compared to last week in my opinion. I discussed this with her at this point today. [name], [name] ([id number]) [id number] Page 5 of 6 Objective Constitutional Well-nourished and well-hydrated in no acute distress. Vitals Time Taken: 9:14 AM, Height: 68 in, Weight: 200 lbs, BMI: 30.4, Temperature: 98.1 F, Pulse: 67 bpm, Respiratory Rate: 18 breaths/min, Blood Pressure: 157/69 mmHg. Respiratory normal breathing without difficulty. Psychiatric this patient is able to make decisions and demonstrates good insight into disease process. Alert and Oriented x 3. pleasant and cooperative. General Notes: Upon inspection patient's wound bed did not  really require any sharp debridement she was also having quite a bit of pain so that was avoided as of today I do not really see much we could remove anyway. With that being said there is still just a lot of formation  appearing in this area unfortunately. Integumentary (Hair, Skin) Wound #1 status is Open. Original cause of wound was Trauma. The date acquired was: 02/07/2023. The wound has been in treatment 3 weeks. The wound is located on the Left,Distal,Lateral Lower Leg. The wound measures 0.7cm length x 0.9cm width x 0.2cm depth; 0.495cm^2 area and 0.099cm^3 volume. There is Fat Layer (Subcutaneous Tissue) exposed. There is no tunneling or undermining noted. There is a medium amount of serosanguineous drainage noted. There is medium (34-66%) red granulation within the wound bed. There is a medium (34-66%) amount of necrotic tissue within the wound bed including Adherent Slough. Assessment Active Problems ICD-10 Chronic venous hypertension (idiopathic) with ulcer and inflammation of left lower extremity Type 2 diabetes mellitus with other skin ulcer Lymphedema, not elsewhere classified Non-pressure chronic ulcer of other part of left lower leg with fat layer exposed Essential (primary) hypertension Plan Follow-up Appointments: Return Appointment in 1 week. Bathing/ Shower/ Hygiene: May shower; gently cleanse wound with antibacterial soap, rinse and pat dry prior to dressing wounds Edema Control - Lymphedema / Segmental Compressive Device / Other: Patient to wear own compression stockings. Remove compression stockings every night before going to bed and put on every morning when getting up. - bi lat Elevate, Exercise Daily and Avoid Standing for Long Periods of Time. Elevate legs to the level of the heart and pump ankles as often as possible Elevate leg(s) parallel to the floor when sitting. WOUND #1: - Lower Leg Wound Laterality: Left, Lateral, Distal Cleanser: Byram Ancillary Kit - 15 Day  Supply (Generic) 3 x Per Week/30 Days Discharge Instructions: Use supplies as instructed; Kit contains: (15) Saline Bullets; (15) 3x3 Gauze; 15 pr Gloves Prim Dressing: Prisma 4.34 (in) 3 x Per Week/30 Days ary Discharge Instructions: Moisten w/normal saline or sterile water; Cover wound as directed. Do not remove from wound bed. Secondary Dressing: (BORDER) Zetuvit Plus SILICONE BORDER Dressing 4x4 (in/in) (Generic) 3 x Per Week/30 Days Discharge Instructions: Please do not put silicone bordered dressings under wraps. Use non-bordered dressing only. 1. Based on what I see I do believe that the patient would benefit from a continuation of therapy with the collagen followed by the bordered foam dressing. 2. Also can recommend that we have the patient continue with the elevation of her legs and again she should be wearing her compression stockings she has known that she has been put number dressing. We will see patient back for reevaluation in 1 week here in the clinic. If anything worsens or changes patient will contact our office for additional recommendations. Electronic Signature(s) Signed: 05/17/2023 6:53:36 PM By: Allen Derry PA-C Entered By: Allen Derry on 05/17/2023 18:53:36 [name], [name] ([id number]) [id number] Page 6 of 6 -------------------------------------------------------------------------------- SuperBill Details Patient Name: Date of Service: [name], [name] 05/16/2023 Medical Record Number: [id number] Patient Account Number: [id number] Date of Birth/Sex: Treating RN: February 10, 1946 (77 y.o. [name] Primary Care Provider: [name] Other Clinician: Referring Provider: Treating Provider/Extender: [name] in Treatment: 3 Diagnosis Coding ICD-10 Codes Code Description [phone] Chronic venous hypertension (idiopathic) with ulcer and inflammation of left lower extremity E11.622 Type 2 diabetes mellitus  with other skin ulcer I89.0 Lymphedema, not elsewhere classified L97.822 Non-pressure chronic ulcer of other part of left lower leg with fat layer exposed I10 Essential (primary) hypertension Facility Procedures : CPT4 Code: [id number] Description: 84132 - WOUND CARE VISIT-LEV 2 EST PT Modifier: Quantity: 1 Physician Procedures : CPT4 Code Description Modifier [id number] - WC PHYS  LEVEL 3 - EST PT ICD-10 Diagnosis Description I87.332 Chronic venous hypertension (idiopathic) with ulcer and inflammation of left lower extremity E11.622 Type 2 diabetes mellitus with other skin  ulcer I89.0 Lymphedema, not elsewhere classified L97.822 Non-pressure chronic ulcer of other part of left lower leg with fat layer exposed Quantity: 1 Electronic Signature(s) Signed: 05/17/2023 6:57:47 PM By: Allen Derry PA-C Previous Signature: 05/16/2023 5:05:34 PM Version By: Allen Derry PA-C Entered By: Allen Derry on 05/17/2023 18:57:47

## 2023-05-19 NOTE — Progress Notes (Signed)
YESLIN, DELIO ([id number]) [id number] Page 1 of 9 Visit Report for 05/16/2023 Arrival Information Details Patient Name: Date of Service: [name], [name] 05/16/2023 8:45 A M Medical Record Number: [id number] Patient Account Number: [id number] Date of Birth/Sex: Treating RN: [birth year] (77 y.o. [name] Primary [name] Domino Holten: [name] Other Clinician: Referring Lenardo Westwood: Treating Camry Robello/Extender: [name] in Treatment: 3 Visit Information History Since Last Visit Added or deleted any medications: No Patient Arrived: Ambulatory Any new allergies or adverse reactions: No Arrival Time: 09:10 Had a fall or experienced change in No Accompanied By: self activities of daily living that may affect Transfer Assistance: None risk of falls: Patient Identification Verified: Yes Signs or symptoms of abuse/neglect since last visito No Secondary Verification Process Completed: Yes Hospitalized since last visit: No Patient Requires Transmission-Based Precautions: No Implantable device outside of the clinic excluding No Patient Has Alerts: No cellular tissue based products placed in the center since last visit: Has Dressing in Place as Prescribed: Yes Pain Present Now: No Electronic Signature(s) Signed: 05/19/2023 1:44:50 PM By: [name] Entered By: Yevonne Pax on 05/16/2023 09:14:43 -------------------------------------------------------------------------------- Clinic Level of [name] Assessment Details Patient Name: Date of Service: [name], [name] 05/16/2023 8:45 A M Medical Record Number: [id number] Patient Account Number: [id number] Date of Birth/Sex: Treating RN: [birth year] (77 y.o. [name] Primary [name] Dontez Hauss: [name] Other Clinician: Referring Teshawn Moan: Treating Danaka Llera/Extender: [name] in Treatment: 3 Clinic Level of [name] Assessment Items TOOL 4  Quantity Score X- 1 0 Use when only an EandM is performed on FOLLOW-UP visit ASSESSMENTS - Nursing Assessment / Reassessment X- 1 10 Reassessment of Co-morbidities (includes updates in patient status) X- 1 5 Reassessment of Adherence to Treatment Plan [name], [name] ([id number]) [id number] Page 2 of 9 ASSESSMENTS - Wound and Skin A ssessment / Reassessment X - Simple Wound Assessment / Reassessment - one wound 1 5 []  - 0 Complex Wound Assessment / Reassessment - multiple wounds []  - 0 Dermatologic / Skin Assessment (not related to wound area) ASSESSMENTS - Focused Assessment []  - 0 Circumferential Edema Measurements - multi extremities []  - 0 Nutritional Assessment / Counseling / Intervention []  - 0 Lower Extremity Assessment (monofilament, tuning fork, pulses) []  - 0 Peripheral Arterial Disease Assessment (using hand held doppler) ASSESSMENTS - Ostomy and/or Continence Assessment and [name] []  - 0 Incontinence Assessment and Management []  - 0 Ostomy [name] Assessment and Management (repouching, etc.) PROCESS - Coordination of [name] X - Simple Patient / Family Education for ongoing [name] 1 15 []  - 0 Complex (extensive) Patient / Family Education for ongoing [name] []  - 0 Staff obtains Chiropractor, Records, T Results / Process Orders est []  - 0 Staff telephones HHA, Nursing Homes / Clarify orders / etc []  - 0 Routine Transfer to another Facility (non-emergent condition) []  - 0 Routine Hospital Admission (non-emergent condition) []  - 0 New Admissions / Manufacturing engineer / Ordering NPWT Apligraf, etc. , []  - 0 Emergency Hospital Admission (emergent condition) X- 1 10 Simple Discharge Coordination []  - 0 Complex (extensive) Discharge Coordination PROCESS - Special Needs []  - 0 Pediatric / Minor Patient Management []  - 0 Isolation Patient Management []  - 0 Hearing / Language / Visual special needs []  - 0 Assessment of Community assistance  (transportation, D/C planning, etc.) []  - 0 Additional assistance / Altered mentation []  - 0 Support Surface(s) Assessment (bed, cushion, seat, etc.) INTERVENTIONS - Wound Cleansing / Measurement X - Simple Wound  Cleansing - one wound 1 5 []  - 0 Complex Wound Cleansing - multiple wounds X- 1 5 Wound Imaging (photographs - any number of wounds) []  - 0 Wound Tracing (instead of photographs) X- 1 5 Simple Wound Measurement - one wound []  - 0 Complex Wound Measurement - multiple wounds INTERVENTIONS - Wound Dressings X - Small Wound Dressing one or multiple wounds 1 10 []  - 0 Medium Wound Dressing one or multiple wounds []  - 0 Large Wound Dressing one or multiple wounds []  - 0 Application of Medications - topical []  - 0 Application of Medications - injection INTERVENTIONS - Miscellaneous []  - 0 External ear exam [name], [name] ([id number]) [phone].pdf Page 3 of 9 []  - 0 Specimen Collection (cultures, biopsies, blood, body fluids, etc.) []  - 0 Specimen(s) / Culture(s) sent or taken to Lab for analysis []  - 0 Patient Transfer (multiple staff / Michiel Sites Lift / Similar devices) []  - 0 Simple Staple / Suture removal (25 or less) []  - 0 Complex Staple / Suture removal (26 or more) []  - 0 Hypo / Hyperglycemic Management (close monitor of Blood Glucose) []  - 0 Ankle / Brachial Index (ABI) - do not check if billed separately X- 1 5 Vital Signs Has the patient been seen at the hospital within the last three years: Yes Total Score: 75 Level Of [name]: New/Established - Level 2 Electronic Signature(s) Signed: 05/19/2023 1:44:50 PM By: [name] Entered By: Yevonne Pax on 05/16/2023 09:38:16 -------------------------------------------------------------------------------- Encounter Discharge Information Details Patient Name: Date of Service: [name], [name] J. 05/16/2023 8:45 A M Medical Record Number: [id number] Patient Account Number:  [id number] Date of Birth/Sex: Treating RN: [birth year] (77 y.o. [name] Primary [name] Miklos Bidinger: [name] Other Clinician: Referring Danuta Huseman: Treating Karan Ramnauth/Extender: [name] in Treatment: 3 Encounter Discharge Information Items Discharge Condition: Stable Ambulatory Status: Ambulatory Discharge Destination: Home Transportation: Private Auto Accompanied By: self Schedule Follow-up Appointment: Yes Clinical Summary of [name]: Electronic Signature(s) Signed: 05/19/2023 1:44:50 PM By: [name] Entered By: Yevonne Pax on 05/16/2023 09:39:11 -------------------------------------------------------------------------------- Lower Extremity Assessment Details Patient Name: Date of Service: [name], [name] 05/16/2023 8:45 A NAPHTALI, RIEDE ([id number]) [phone].pdf Page 4 of 9 Medical Record Number: [id number] Patient Account Number: [id number] Date of Birth/Sex: Treating RN: 1945-12-22 (77 y.o. [name] Primary [name] Indiah Heyden: [name] Other Clinician: Referring Breylen Agyeman: Treating Mead Slane/Extender: [name] in Treatment: 3 Edema Assessment Assessed: [Left: No] [Right: No] Edema: [Left: Ye] [Right: s] Calf Left: Right: Point of Measurement: 35 cm From Medial Instep 38 cm Ankle Left: Right: Point of Measurement: 10 cm From Medial Instep 23 cm Vascular Assessment Pulses: Dorsalis Pedis Palpable: [Left:Yes] Electronic Signature(s) Signed: 05/19/2023 1:44:50 PM By: [name] Entered By: Yevonne Pax on 05/16/2023 09:18:33 -------------------------------------------------------------------------------- Multi Wound Chart Details Patient Name: Date of Service: [name], [name] J. 05/16/2023 8:45 A M Medical Record Number: [id number] Patient Account Number: [id number] Date of Birth/Sex: Treating RN: June 23, 1946 (77 y.o. [name] Primary [name] Breon Rehm:  [name] Other Clinician: Referring Kolden Dupee: Treating Jennifr Gaeta/Extender: [name] in Treatment: 3 Vital Signs Height(in): 68 Pulse(bpm): 67 Weight(lbs): 200 Blood Pressure(mmHg): 157/69 Body Mass Index(BMI): 30.4 Temperature(F): 98.1 Respiratory Rate(breaths/min): 18 [1:Photos:] [N/A:N/A] Left, Distal, Lateral Lower Leg N/A N/A Wound Location: Trauma N/A N/A Wounding Event: Diabetic Wound/Ulcer of the Lower N/A N/A Primary EtiologyJAZZMON, [name] ([id number]) [phone].pdf Page 5 of 9 Extremity Lymphedema, Hypertension, Type II N/A N/A Comorbid  History: Diabetes 02/07/2023 N/A N/A Date Acquired: 3 N/A N/A Weeks of Treatment: Open N/A N/A Wound Status: No N/A N/A Wound Recurrence: 0.7x0.9x0.2 N/A N/A Measurements L x W x D (cm) 0.495 N/A N/A A (cm) : rea 0.099 N/A N/A Volume (cm) : -40.20% N/A N/A % Reduction in A rea: 6.60% N/A N/A % Reduction in Volume: Grade 1 N/A N/A Classification: Medium N/A N/A Exudate A mount: Serosanguineous N/A N/A Exudate Type: red, brown N/A N/A Exudate Color: Medium (34-66%) N/A N/A Granulation A mount: Red N/A N/A Granulation Quality: Medium (34-66%) N/A N/A Necrotic A mount: Fat Layer (Subcutaneous Tissue): Yes N/A N/A Exposed Structures: Fascia: No Tendon: No Muscle: No Joint: No Bone: No None N/A N/A Epithelialization: Treatment Notes Electronic Signature(s) Signed: 05/19/2023 1:44:50 PM By: [name] Entered By: Yevonne Pax on 05/16/2023 09:18:41 -------------------------------------------------------------------------------- Multi-Disciplinary [name] Plan Details Patient Name: Date of Service: [name], [name] J. 05/16/2023 8:45 A M Medical Record Number: [id number] Patient Account Number: [id number] Date of Birth/Sex: Treating RN: 05/30/1946 (77 y.o. [name] Primary [name] Mardee Clune: [name] Other Clinician: Referring  Evora Schechter: Treating Carlesha Seiple/Extender: [name] in Treatment: 3 Active Inactive Necrotic Tissue Nursing Diagnoses: Knowledge deficit related to management of necrotic/devitalized tissue Goals: Patient/caregiver will verbalize understanding of reason and process for debridement of necrotic tissue Date Initiated: 04/22/2023 Target Resolution Date: 05/22/2023 Goal Status: Active Interventions: Assess patient pain level pre-, during and post procedure and prior to discharge Notes: Wound/Skin Impairment Nursing Diagnoses: NAELA, NODAL ([id number]) [phone].pdf Page 6 of 9 Knowledge deficit related to ulceration/compromised skin integrity Goals: Patient/caregiver will verbalize understanding of skin [name] regimen Date Initiated: 04/22/2023 Target Resolution Date: 05/22/2023 Goal Status: Active Ulcer/skin breakdown will have a volume reduction of 30% by week 4 Date Initiated: 04/22/2023 Target Resolution Date: 05/22/2023 Goal Status: Active Ulcer/skin breakdown will have a volume reduction of 50% by week 8 Date Initiated: 04/22/2023 Target Resolution Date: 06/22/2023 Goal Status: Active Ulcer/skin breakdown will have a volume reduction of 80% by week 12 Date Initiated: 04/22/2023 Target Resolution Date: 07/23/2023 Goal Status: Active Ulcer/skin breakdown will heal within 14 weeks Date Initiated: 04/22/2023 Target Resolution Date: 08/22/2023 Goal Status: Active Interventions: Assess patient/caregiver ability to obtain necessary supplies Assess patient/caregiver ability to perform ulcer/skin [name] regimen upon admission and as needed Assess ulceration(s) every visit Notes: Electronic Signature(s) Signed: 05/19/2023 1:44:50 PM By: [name] Entered By: Yevonne Pax on 05/16/2023 09:19:08 -------------------------------------------------------------------------------- Pain Assessment Details Patient Name: Date of Service: [name], [name] 05/16/2023 8:45 A M Medical Record Number: [id number] Patient Account Number: [id number] Date of Birth/Sex: Treating RN: June 20, 1946 (77 y.o. [name] Primary [name] Leanda Padmore: [name] Other Clinician: Referring Evangela Heffler: Treating Maie Kesinger/Extender: [name] in Treatment: 3 Active Problems Location of Pain Severity and Description of Pain Patient Has Paino No Site Locations [name], [name] ([id number]) [id number] Page 7 of 9 Pain Management and Medication Current Pain Management: Electronic Signature(s) Signed: 05/19/2023 1:44:50 PM By: [name] Entered By: Yevonne Pax on 05/16/2023 09:15:23 -------------------------------------------------------------------------------- Patient/Caregiver Education Details Patient Name: Date of Service: [name] 7/8/2024andnbsp8:45 A M Medical Record Number: [id number] Patient Account Number: [id number] Date of Birth/Gender: Treating RN: 06/25/1946 (77 y.o. [name] Primary [name] Physician: [name] Other Clinician: Referring Physician: Treating Physician/Extender: [name] in Treatment: 3 Education Assessment Education Provided To: Patient Education Topics Provided Wound/Skin Impairment: Handouts: Caring for Your Ulcer Methods: Explain/Verbal Responses: State content correctly  Electronic Signature(s) Signed: 05/19/2023 1:44:50 PM By: [name] Entered By: Yevonne Pax on 05/16/2023 09:19:32 -------------------------------------------------------------------------------- Wound Assessment Details Patient Name: Date of Service: [name], [name] 05/16/2023 8:45 A M Medical Record Number: [id number] Patient Account Number: [id number] Date of Birth/Sex: Treating RN: 02/20/1946 (77 y.o. [name] Primary [name] Anwen Cannedy: [name] Other Clinician: Referring Maxi Carreras: Treating  Meilani Edmundson/Extender: [name] in Treatment: 3 Wound Status Wound Number: 1 Primary Etiology: Diabetic Wound/Ulcer of the Lower Extremity [name], [name] ([id number]) [id number] Page 8 of 9 Wound Location: Left, Distal, Lateral Lower Leg Wound Status: Open Wounding Event: Trauma Comorbid History: Lymphedema, Hypertension, Type II Diabetes Date Acquired: 02/07/2023 Weeks Of Treatment: 3 Clustered Wound: No Photos Wound Measurements Length: (cm) 0.7 Width: (cm) 0.9 Depth: (cm) 0.2 Area: (cm) 0.495 Volume: (cm) 0.099 % Reduction in Area: -40.2% % Reduction in Volume: 6.6% Epithelialization: None Tunneling: No Undermining: No Wound Description Classification: Grade 1 Exudate Amount: Medium Exudate Type: Serosanguineous Exudate Color: red, brown Foul Odor After Cleansing: No Slough/Fibrino Yes Wound Bed Granulation Amount: Medium (34-66%) Exposed Structure Granulation Quality: Red Fascia Exposed: No Necrotic Amount: Medium (34-66%) Fat Layer (Subcutaneous Tissue) Exposed: Yes Necrotic Quality: Adherent Slough Tendon Exposed: No Muscle Exposed: No Joint Exposed: No Bone Exposed: No Treatment Notes Wound #1 (Lower Leg) Wound Laterality: Left, Lateral, Distal Cleanser [place] Ancillary Kit - 15 Day Supply Discharge Instruction: Use supplies as instructed; Kit contains: (15) Saline Bullets; (15) 3x3 Gauze; 15 pr Gloves Peri-Wound [name] Topical Primary Dressing Prisma 4.34 (in) Discharge Instruction: Moisten w/normal saline or sterile water; Cover wound as directed. Do not remove from wound bed. Secondary Dressing (BORDER) Zetuvit Plus SILICONE BORDER Dressing 4x4 (in/in) Discharge Instruction: Please do not put silicone bordered dressings under wraps. Use non-bordered dressing only. Secured With Compression Wrap Compression Stockings Facilities manager) Signed: 05/19/2023 1:44:50 PM By: Yevonne Pax  RN Entered By: Yevonne Pax on 05/16/2023 09:17:54 Gayla Doss ([id number]) [id number] Page 9 of 9 -------------------------------------------------------------------------------- Vitals Details Patient Name: Date of Service: CHYNNA, BUERKLE 05/16/2023 8:45 A M Medical Record Number: [id number] Patient Account Number: [id number] Date of Birth/Sex: Treating RN: November 06, 1946 (77 y.o. [name] Primary [name] Maryetta Shafer: [name] Other Clinician: Referring Muath Hallam: Treating Katey Barrie/Extender: [name] in Treatment: 3 Vital Signs Time Taken: 09:14 Temperature (F): 98.1 Height (in): 68 Pulse (bpm): 67 Weight (lbs): 200 Respiratory Rate (breaths/min): 18 Body Mass Index (BMI): 30.4 Blood Pressure (mmHg): 157/69 Reference Range: 80 - 120 mg / dl Electronic Signature(s) Signed: 05/19/2023 1:44:50 PM By: [name] Entered By: Yevonne Pax on 05/16/2023 09:15:05

## 2023-05-23 ENCOUNTER — Encounter: Payer: Federal, State, Local not specified - PPO | Admitting: Physician Assistant

## 2023-05-23 DIAGNOSIS — E11622 Type 2 diabetes mellitus with other skin ulcer: Secondary | ICD-10-CM | POA: Diagnosis not present

## 2023-05-23 NOTE — Progress Notes (Addendum)
ELDA, HARDMON ([id number]) [id number] Page 1 of 7 Visit Report for 05/23/2023 Chief Complaint Document Details Patient Name: Date of Service: [name], [name] 05/23/2023 8:45 A M Medical Record Number: [id number] Patient Account Number: [id number] Date of Birth/Sex: Treating RN: 1946/06/26 (77 y.o. [name] Primary Care Provider: [name] Other Clinician: Referring Provider: Treating Provider/Extender: [name] in Treatment: 4 Information Obtained from: Patient Chief Complaint Left LE Ulcer Electronic Signature(s) Signed: 05/23/2023 9:10:47 AM By: Allen Derry PA-C Entered By: Allen Derry on 05/23/2023 09:10:47 -------------------------------------------------------------------------------- Debridement Details Patient Name: Date of Service: [name], [name] J. 05/23/2023 8:45 A M Medical Record Number: [id number] Patient Account Number: [id number] Date of Birth/Sex: Treating RN: [birth year] (77 y.o. [name] Primary Care Provider: [name] Other Clinician: Referring Provider: Treating Provider/Extender: [name] in Treatment: 4 Debridement Performed for Assessment: Wound #1 Left,Distal,Lateral Lower Leg Performed By: Physician Allen Derry, PA-C Debridement Type: Debridement Severity of Tissue Pre Debridement: Fat layer exposed Level of Consciousness (Pre-procedure): Awake and Alert Pre-procedure Verification/Time Out Yes - 09:15 Taken: Start Time: 09:15 Percent of Wound Bed Debrided: 100% T Area Debrided (cm): otal 0.35 Tissue and other material debrided: Viable, Non-Viable, Slough, Subcutaneous, Slough Level: Skin/Subcutaneous Tissue Debridement Description: Excisional Instrument: Curette Bleeding: Minimum Hemostasis Achieved: Pressure End Time: 09:18 Procedural Pain: 0 Post Procedural Pain: 0 CASSI, BONEY ([id number])  [phone].pdf Page 2 of 7 Response to Treatment: Procedure was tolerated well Level of Consciousness (Post- Awake and Alert procedure): Post Debridement Measurements of Total Wound Length: (cm) 0.5 Width: (cm) 0.9 Depth: (cm) 0.2 Volume: (cm) 0.071 Character of Wound/Ulcer Post Debridement: Improved Severity of Tissue Post Debridement: Fat layer exposed Post Procedure Diagnosis Same as Pre-procedure Electronic Signature(s) Signed: 05/24/2023 5:59:11 PM By: Allen Derry PA-C Signed: 05/26/2023 3:42:23 PM By: [name] Entered By: Yevonne Pax on 05/23/2023 09:18:26 -------------------------------------------------------------------------------- HPI Details Patient Name: Date of Service: [name], [name] J. 05/23/2023 8:45 A M Medical Record Number: [id number] Patient Account Number: [id number] Date of Birth/Sex: Treating RN: 06/22/1946 (77 y.o. [name] Primary Care Provider: [name] Other Clinician: Referring Provider: Treating Provider/Extender: [name] in Treatment: 4 History of Present Illness HPI Description: 04-22-2023 upon evaluation patient appears to be doing somewhat poorly in regard to a wound on the left distal/lateral lower extremity and about the ankle region. She tells me this occurred on February 07, 2023 as a result of trying to get a very tight and new compression sock off. It was getting stuck and she somewhat twisted trying to get it off and pulled skin off. Since that time she has been having a hard time getting this to heal. Fortunately there does not appear to be any signs of active infection which is great news. She did have an ABI of 0.75 today. With that being said I am actually decently pleased with how the wound looks it does have some necrotic tissue but there is going to need to be some sharp debridement to clear away some of the dead tissue as well today. Patient does have a history of  diabetes mellitus type 2, lymphedema, hypertension, and chronic venous insufficiency. 05-02-2023 upon evaluation today patient appears to be doing well currently in regard to her wound. She is actually showing signs of improvement and very pleased in that regard and fortunately I do not see any signs of active infection locally or systemically at this time which is great news. No fevers,  chills, nausea, vomiting, or diarrhea. 05-09-2023 upon evaluation today patient appears to be doing well currently in regard to her wound is not worse but is also not better. I discussed with her again today that we will get a need to likely compression wrap for her. With that being said she is adamantly opposed to this and wants to get 1 more week given something to try and I recommend to be seen by double layer Tubigrip to see if this can be beneficial. 05-17-2023 upon evaluation today patient appears to be doing well currently in regard to her wound although it still very inflamed on the posterior aspect of her ankle. I discussed that with her today. With that being said I do not see any evidence of general worsening which is great news but also do not see a significant amount of improvement compared to last week in my opinion. I discussed this with her at this point today. 05-23-2023 upon evaluation today patient appears to be doing well currently in regard to her wound. This is actually showing some slough and biofilm on the surface of the wound. The good news is I do believe that we cleaned some of the soften the scar ischial little bit of improvement with that being said the periwound is being very irritated I think the Vashe may have irritated her based on what she is telling me. Fortunately I do not see any evidence of active infection locally nor systemically at this time which is great news. No fevers, chills, nausea, vomiting, or diarrhea. Electronic Signature(s) Signed: 05/23/2023 9:18:43 AM By: Allen Derry  PA-C Entered By: Allen Derry on 05/23/2023 09:18:43 [name] ([id number]) [id number] Page 3 of 7 -------------------------------------------------------------------------------- Physical Exam Details Patient Name: Date of Service: [name], [name] 05/23/2023 8:45 A M Medical Record Number: [id number] Patient Account Number: [id number] Date of Birth/Sex: Treating RN: Sep 30, 1946 (77 y.o. [name] Primary Care Provider: [name] Other Clinician: Referring Provider: Treating Provider/Extender: [name] in Treatment: 4 Constitutional Obese and well-hydrated in no acute distress. Respiratory normal breathing without difficulty. Psychiatric this patient is able to make decisions and demonstrates good insight into disease process. Alert and Oriented x 3. pleasant and cooperative. Notes Upon inspection patient's wound bed actually showed signs of good granulation epithelization at this point. Fortunately there was some need for debridement she did well with this there does not appear to be any signs of active infection systemically which is also good news. Locally also feel like this looks pretty good. Electronic Signature(s) Signed: 05/23/2023 9:19:00 AM By: Allen Derry PA-C Entered By: Allen Derry on 05/23/2023 09:19:00 -------------------------------------------------------------------------------- Physician Orders Details Patient Name: Date of Service: [name], [name] J. 05/23/2023 8:45 A M Medical Record Number: [id number] Patient Account Number: [id number] Date of Birth/Sex: Treating RN: 07-11-1946 (77 y.o. [name] Primary Care Provider: [name] Other Clinician: Referring Provider: Treating Provider/Extender: [name] in Treatment: 4 Verbal / Phone Orders: No Diagnosis Coding ICD-10 Coding Code Description [phone] Chronic venous hypertension (idiopathic)  with ulcer and inflammation of left lower extremity E11.622 Type 2 diabetes mellitus with other skin ulcer I89.0 Lymphedema, not elsewhere classified L97.822 Non-pressure chronic ulcer of other part of left lower leg with fat layer exposed I10 Essential (primary) hypertension PORCHEA, BOEVERS ([id number]) [id number] Page 4 of 7 Follow-up Appointments Return Appointment in 1 week. Bathing/ Shower/ Hygiene May shower; gently cleanse wound with antibacterial soap, rinse and pat dry prior to dressing  wounds Edema Control - Lymphedema / Segmental Compressive Device / Other Patient to wear own compression stockings. Remove compression stockings every night before going to bed and put on every morning when getting up. - bi lat Elevate, Exercise Daily and A void Standing for Long Periods of Time. Elevate legs to the level of the heart and pump ankles as often as possible Elevate leg(s) parallel to the floor when sitting. Wound Treatment Wound #1 - Lower Leg Wound Laterality: Left, Lateral, Distal Cleanser: Byram Ancillary Kit - 15 Day Supply (Generic) 3 x Per Week/30 Days Discharge Instructions: Use supplies as instructed; Kit contains: (15) Saline Bullets; (15) 3x3 Gauze; 15 pr Gloves Prim Dressing: Prisma 4.34 (in) 3 x Per Week/30 Days ary Discharge Instructions: Moisten w/normal saline or sterile water; Cover wound as directed. Do not remove from wound bed. Secondary Dressing: ABD Pad 5x9 (in/in) 3 x Per Week/30 Days Discharge Instructions: Cover with ABD pad Secondary Dressing: Kerlix 4.5 x 4.1 (in/yd) 3 x Per Week/30 Days Discharge Instructions: Apply Kerlix 4.5 x 4.1 (in/yd) as instructed Secured With: Medipore T - 41M Medipore H Soft Cloth Surgical T ape ape, 2x2 (in/yd) 3 x Per Week/30 Days Electronic Signature(s) Signed: 05/24/2023 5:59:11 PM By: Allen Derry PA-C Signed: 05/26/2023 3:42:23 PM By: [name] Entered By: Yevonne Pax on 05/23/2023  09:17:18 -------------------------------------------------------------------------------- Problem List Details Patient Name: Date of Service: [name], [name] J. 05/23/2023 8:45 A M Medical Record Number: [id number] Patient Account Number: [id number] Date of Birth/Sex: Treating RN: 1946/07/04 (77 y.o. 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 05/23/2023 8:45 A M Medical Record Number: [id number] Patient Account Number: [id number] Date of Birth/Sex: Treating RN: 08/31/1946 (77 y.o. [name] Primary Care Provider: [name] Other Clinician: Referring Provider: Treating Provider/Extender: [name] in Treatment: 4 Subjective Chief Complaint Information obtained from Patient Left LE Ulcer History of Present Illness (HPI) 04-22-2023 upon evaluation patient appears to be doing somewhat poorly in regard to a wound on the left distal/lateral lower  extremity and about the ankle region. She tells me this occurred on February 07, 2023 as a result of trying to get a very tight and new compression sock off. It was getting stuck and she somewhat twisted trying to get it off and pulled skin off. Since that time she has been having a hard time getting this to heal. Fortunately there does not appear to be any signs of active infection which is great news. She did have an ABI of 0.75 today. With that being said I am actually decently pleased with how the wound looks it does have some necrotic tissue but there is going to need to be some sharp debridement to clear away some of the dead tissue  as well today. Patient does have a history of diabetes mellitus type 2, lymphedema, hypertension, and chronic venous insufficiency. 05-02-2023 upon evaluation today patient appears to be doing well currently in regard to her wound. She is actually showing signs of improvement and very pleased in that regard and fortunately I do not see any signs of active infection locally or systemically at this time which is great news. No fevers, chills, nausea, vomiting, or diarrhea. 05-09-2023 upon evaluation today patient appears to be doing well currently in regard to her wound is not worse but is also not better. I discussed with her again today that we will get a need to likely compression wrap for her. With that being said she is adamantly opposed to this and wants to get 1 more week given something to try and I recommend to be seen by double layer Tubigrip to see if this can be beneficial. 05-17-2023 upon evaluation today patient appears to be doing well currently in regard to her wound although it still very inflamed on the posterior aspect of her ankle. I discussed that with her today. With that being said I do not see any evidence of general worsening which is great news but also do not see a significant amount of improvement compared to last week in my opinion. I discussed this  with her at this point today. 05-23-2023 upon evaluation today patient appears to be doing well currently in regard to her wound. This is actually showing some slough and biofilm on the surface of the wound. The good news is I do believe that we cleaned some of the soften the scar ischial little bit of improvement with that being said the periwound is being very irritated I think the Vashe may have irritated her based on what she is telling me. Fortunately I do not see any evidence of active infection locally nor systemically at this time which is great news. No fevers, chills, nausea, vomiting, or diarrhea. LASHANN, DAIRE ([id number]) [id number] Page 6 of 7 Objective Constitutional Obese and well-hydrated in no acute distress. Vitals Time Taken: 8:53 AM, Height: 68 in, Weight: 200 lbs, BMI: 30.4, Temperature: 98.4 F, Pulse: 73 bpm, Respiratory Rate: 18 breaths/min, Blood Pressure: 144/71 mmHg. Respiratory normal breathing without difficulty. Psychiatric this patient is able to make decisions and demonstrates good insight into disease process. Alert and Oriented x 3. pleasant and cooperative. General Notes: Upon inspection patient's wound bed actually showed signs of good granulation epithelization at this point. Fortunately there was some need for debridement she did well with this there does not appear to be any signs of active infection systemically which is also good news. Locally also feel like this looks pretty good. Integumentary (Hair, Skin) Wound #1 status is Open. Original cause of wound was Trauma. The date acquired was: 02/07/2023. The wound has been in treatment 4 weeks. The wound is located on the Left,Distal,Lateral Lower Leg. The wound measures 0.5cm length x 0.9cm width x 0.2cm depth; 0.353cm^2 area and 0.071cm^3 volume. There is Fat Layer (Subcutaneous Tissue) exposed. There is no tunneling or undermining noted. There is a medium amount of  serosanguineous drainage noted. There is medium (34-66%) red granulation within the wound bed. There is a medium (34-66%) amount of necrotic tissue within the wound bed including Adherent Slough. Assessment Active Problems ICD-10 Chronic venous hypertension (idiopathic) with ulcer and inflammation of left lower extremity Type 2 diabetes mellitus with other skin ulcer Lymphedema, not elsewhere classified Non-pressure chronic ulcer of other part  of left lower leg with fat layer exposed Essential (primary) hypertension Procedures Wound #1 Pre-procedure diagnosis of Wound #1 is a Diabetic Wound/Ulcer of the Lower Extremity located on the Left,Distal,Lateral Lower Leg .Severity of Tissue Pre Debridement is: Fat layer exposed. There was a Excisional Skin/Subcutaneous Tissue Debridement with a total area of 0.35 sq cm performed by Allen Derry, PA-C. With the following instrument(s): Curette to remove Viable and Non-Viable tissue/material. Material removed includes Subcutaneous Tissue and Slough and. No specimens were taken. A time out was conducted at 09:15, prior to the start of the procedure. A Minimum amount of bleeding was controlled with Pressure. The procedure was tolerated well with a pain level of 0 throughout and a pain level of 0 following the procedure. Post Debridement Measurements: 0.5cm length x 0.9cm width x 0.2cm depth; 0.071cm^3 volume. Character of Wound/Ulcer Post Debridement is improved. Severity of Tissue Post Debridement is: Fat layer exposed. Post procedure Diagnosis Wound #1: Same as Pre-Procedure Plan Follow-up Appointments: Return Appointment in 1 week. Bathing/ Shower/ Hygiene: May shower; gently cleanse wound with antibacterial soap, rinse and pat dry prior to dressing wounds Edema Control - Lymphedema / Segmental Compressive Device / Other: Patient to wear own compression stockings. Remove compression stockings every night before going to bed and put on every morning  when getting up. - bi lat Elevate, Exercise Daily and Avoid Standing for Long Periods of Time. Elevate legs to the level of the heart and pump ankles as often as possible Elevate leg(s) parallel to the floor when sitting. WOUND #1: - Lower Leg Wound Laterality: Left, Lateral, Distal Cleanser: Byram Ancillary Kit - 15 Day Supply (Generic) 3 x Per Week/30 Days Discharge Instructions: Use supplies as instructed; Kit contains: (15) Saline Bullets; (15) 3x3 Gauze; 15 pr Gloves Prim Dressing: Prisma 4.34 (in) 3 x Per Week/30 Days ary Discharge Instructions: Moisten w/normal saline or sterile water; Cover wound as directed. Do not remove from wound bed. Secondary Dressing: ABD Pad 5x9 (in/in) 3 x Per Week/30 Days Discharge Instructions: Cover with ABD pad Secondary Dressing: Kerlix 4.5 x 4.1 (in/yd) 3 x Per Week/30 Days Discharge Instructions: Apply Kerlix 4.5 x 4.1 (in/yd) as instructed Secured With: Medipore T - 25M Medipore H Soft Cloth Surgical T ape ape, 2x2 (in/yd) 3 x Per Week/30 Days DIEDRE, HELLING ([id number]) [id number] Page 7 of 7 1. I would recommend that we have the patient continue to monitor for any evidence of infection or worsening. Based on what I am seeing I do think that we are headed in the right direction. 2 also can recommend that she should continue with ABD pads following the Prisma and then again roll gauze to secure in place with avoid anything that would cause any type of irritation around her skin and again in the future will clean which is saline not Vashe. We will see patient back for reevaluation in 1 week here in the clinic. If anything worsens or changes patient will contact our office for additional recommendations. Electronic Signature(s) Signed: 05/23/2023 9:19:32 AM By: Allen Derry PA-C Entered By: Allen Derry on 05/23/2023 09:19:32 -------------------------------------------------------------------------------- SuperBill  Details Patient Name: Date of Service: [name], Nestor [name] 05/23/2023 Medical Record Number: [id number] Patient Account Number: [id number] Date of Birth/Sex: Treating RN: 1946-04-06 (77 y.o. [name] Primary Care Provider: [name] Other Clinician: Referring Provider: Treating Provider/Extender: [name] in Treatment: 4 Diagnosis Coding ICD-10 Codes Code Description [phone] Chronic venous hypertension (idiopathic) with ulcer and inflammation of left  lower extremity E11.622 Type 2 diabetes mellitus with other skin ulcer I89.0 Lymphedema, not elsewhere classified L97.822 Non-pressure chronic ulcer of other part of left lower leg with fat layer exposed I10 Essential (primary) hypertension Facility Procedures : CPT4 Code: [id number] Description: 11042 - DEB SUBQ TISSUE 20 SQ CM/< ICD-10 Diagnosis Description L97.822 Non-pressure chronic ulcer of other part of left lower leg with fat layer expos Modifier: ed Quantity: 1 Physician Procedures : CPT4 Code Description Modifier [id number] 11042 - WC PHYS SUBQ TISS 20 SQ CM ICD-10 Diagnosis Description L97.822 Non-pressure chronic ulcer of other part of left lower leg with fat layer exposed Quantity: 1 Electronic Signature(s) Signed: 05/23/2023 9:19:53 AM By: Allen Derry PA-C Entered By: Allen Derry on 05/23/2023 09:19:53

## 2023-05-26 NOTE — Progress Notes (Signed)
[name] ([id number]) [id number] Page 1 of 7 Visit Report for 05/23/2023 Arrival Information Details Patient Name: Date of Service: [name], [name] 05/23/2023 8:45 A M Medical Record Number: [id number] Patient Account Number: [id number] Date of Birth/Sex: Treating RN: May 11, 1946 (77 y.o. [name] Primary [name] Yzabella Crunk: [name] Other Clinician: Referring Anacarolina Evelyn: Treating Ilya Neely/Extender: [name] in Treatment: 4 Visit Information History Since Last Visit All ordered tests and consults were completed: No Patient Arrived: Ambulatory Added or deleted any medications: No Arrival Time: 08:50 Any new allergies or adverse reactions: No Accompanied By: self Had a fall or experienced change in No Transfer Assistance: None activities of daily living that may affect Patient Identification Verified: Yes risk of falls: Secondary Verification Process Completed: Yes Signs or symptoms of abuse/neglect since last visito No Patient Requires Transmission-Based Precautions: No Hospitalized since last visit: No Patient Has Alerts: No Implantable device outside of the clinic excluding No cellular tissue based products placed in the center since last visit: Has Dressing in Place as Prescribed: Yes Has Compression in Place as Prescribed: Yes Pain Present Now: No Electronic Signature(s) Signed: 05/26/2023 3:42:23 PM By: [name] Entered By: Yevonne Pax on 05/23/2023 08:53:47 -------------------------------------------------------------------------------- Encounter Discharge Information Details Patient Name: Date of Service: [name], [name] J. 05/23/2023 8:45 A M Medical Record Number: [id number] Patient Account Number: [id number] Date of Birth/Sex: Treating RN: 1946/04/21 (77 y.o. [name] Primary [name] Exavier Lina: [name] Other Clinician: Referring Byard Carranza: Treating Maxcine Strong/Extender:  [name] in Treatment: 4 Encounter Discharge Information Items Post Procedure Vitals Discharge Condition: Stable Temperature (F): 98.4 Ambulatory Status: Ambulatory Pulse (bpm): 73 Discharge Destination: Home Respiratory Rate (breaths/min): 18 Transportation: Private Auto Blood Pressure (mmHg): 144/71 Accompanied By: self Schedule Follow-up Appointment: LYNDSAY, TALAMANTE ([id number]) [id number] Page 2 of 7 Clinical Summary of [name]: Electronic Signature(s) Signed: 05/23/2023 9:27:15 AM By: [name] Entered By: Yevonne Pax on 05/23/2023 09:27:15 -------------------------------------------------------------------------------- Lower Extremity Assessment Details Patient Name: Date of Service: [name] 05/23/2023 8:45 A M Medical Record Number: [id number] Patient Account Number: [id number] Date of Birth/Sex: Treating RN: 09/01/1946 (77 y.o. [name] Primary [name] Lerry Cordrey: [name] Other Clinician: Referring Simonne Boulos: Treating Kitiara Hintze/Extender: [name] in Treatment: 4 Edema Assessment Assessed: [Left: No] [Right: No] Edema: [Left: N] [Right: o] Calf Left: Right: Point of Measurement: 35 cm From Medial Instep 37 cm Ankle Left: Right: Point of Measurement: 10 cm From Medial Instep 22 cm Vascular Assessment Pulses: Dorsalis Pedis Palpable: [Left:Yes] Electronic Signature(s) Signed: 05/26/2023 3:42:23 PM By: [name] Entered By: Yevonne Pax on 05/23/2023 08:58:40 -------------------------------------------------------------------------------- Multi Wound Chart Details Patient Name: Date of Service: [name], [name] J. 05/23/2023 8:45 A M Medical Record Number: [id number] Patient Account Number: [id number] Date of Birth/Sex: Treating RN: July 20, 1946 (77 y.o. [name] Primary [name] Shriya Aker: [name] Other Clinician: Referring  Saige Busby: Treating Seng Fouts/Extender: Jaimey, Franchini, Nestor Ramp ([id number]) [id number] Page 3 of 7 Weeks in Treatment: 4 Vital Signs Height(in): 68 Pulse(bpm): 73 Weight(lbs): 200 Blood Pressure(mmHg): 144/71 Body Mass Index(BMI): 30.4 Temperature(F): 98.4 Respiratory Rate(breaths/min): 18 [1:Photos:] [N/A:N/A] Left, Distal, Lateral Lower Leg N/A N/A Wound Location: Trauma N/A N/A Wounding Event: Diabetic Wound/Ulcer of the Lower N/A N/A Primary Etiology: Extremity Lymphedema, Hypertension, Type II N/A N/A Comorbid History: Diabetes 02/07/2023 N/A N/A Date Acquired: 4 N/A N/A Weeks of Treatment: Open N/A N/A Wound Status: No N/A N/A Wound Recurrence: 0.5x0.9x0.2 N/A N/A  Measurements L x W x D (cm) 0.353 N/A N/A A (cm) : rea 0.071 N/A N/A Volume (cm) : 0.00% N/A N/A % Reduction in A rea: 33.00% N/A N/A % Reduction in Volume: Grade 1 N/A N/A Classification: Medium N/A N/A Exudate A mount: Serosanguineous N/A N/A Exudate Type: red, brown N/A N/A Exudate Color: Medium (34-66%) N/A N/A Granulation A mount: Red N/A N/A Granulation Quality: Medium (34-66%) N/A N/A Necrotic A mount: Fat Layer (Subcutaneous Tissue): Yes N/A N/A Exposed Structures: Fascia: No Tendon: No Muscle: No Joint: No Bone: No None N/A N/A Epithelialization: Treatment Notes Electronic Signature(s) Signed: 05/26/2023 3:42:23 PM By: [name] Entered By: Yevonne Pax on 05/23/2023 08:59:14 -------------------------------------------------------------------------------- Multi-Disciplinary [name] Plan Details Patient Name: Date of Service: [name], [name] J. 05/23/2023 8:45 A M Medical Record Number: [id number] Patient Account Number: [id number] Date of Birth/Sex: Treating RN: 07-24-1946 (77 y.o. [name] Primary [name] Jesiah Grismer: [name] Other Clinician: Referring Sayyid Harewood: Treating Keyle Doby/Extender: Gera, Inboden, Nestor Ramp ([id number]) [id number] Page 4 of 7 Weeks in Treatment: 4 Active Inactive Necrotic Tissue Nursing Diagnoses: Knowledge deficit related to management of necrotic/devitalized tissue Goals: Patient/caregiver will verbalize understanding of reason and process for debridement of necrotic tissue Date Initiated: 04/22/2023 Target Resolution Date: 05/22/2023 Goal Status: Active Interventions: Assess patient pain level pre-, during and post procedure and prior to discharge Notes: Wound/Skin Impairment Nursing Diagnoses: Knowledge deficit related to ulceration/compromised skin integrity Goals: Patient/caregiver will verbalize understanding of skin [name] regimen Date Initiated: 04/22/2023 Target Resolution Date: 05/22/2023 Goal Status: Active Ulcer/skin breakdown will have a volume reduction of 30% by week 4 Date Initiated: 04/22/2023 Target Resolution Date: 05/22/2023 Goal Status: Active Ulcer/skin breakdown will have a volume reduction of 50% by week 8 Date Initiated: 04/22/2023 Target Resolution Date: 06/22/2023 Goal Status: Active Ulcer/skin breakdown will have a volume reduction of 80% by week 12 Date Initiated: 04/22/2023 Target Resolution Date: 07/23/2023 Goal Status: Active Ulcer/skin breakdown will heal within 14 weeks Date Initiated: 04/22/2023 Target Resolution Date: 08/22/2023 Goal Status: Active Interventions: Assess patient/caregiver ability to obtain necessary supplies Assess patient/caregiver ability to perform ulcer/skin [name] regimen upon admission and as needed Assess ulceration(s) every visit Notes: Electronic Signature(s) Signed: 05/26/2023 3:42:23 PM By: [name] Entered By: Yevonne Pax on 05/23/2023 08:59:38 -------------------------------------------------------------------------------- Pain Assessment Details Patient Name: Date of Service: [name], [name] 05/23/2023 8:45 A M Medical  Record Number: [id number] Patient Account Number: [id number] Date of Birth/Sex: Treating RN: [birth year] (77 y.o. [name] Primary [name] Quierra Silverio: [name] Other Clinician: Referring Cariah Salatino: Treating Ravindra Baranek/Extender: [name] in Treatment: 4 Meyerhoff, Nestor Ramp ([id number]) [id number] Page 5 of 7 Active Problems Location of Pain Severity and Description of Pain Patient Has Paino No Site Locations Pain Management and Medication Current Pain Management: Electronic Signature(s) Signed: 05/26/2023 3:42:23 PM By: [name] Entered By: Yevonne Pax on 05/23/2023 08:54:18 -------------------------------------------------------------------------------- Patient/Caregiver Education Details Patient Name: Date of Service: [name] 7/15/2024andnbsp8:45 A M Medical Record Number: [id number] Patient Account Number: [id number] Date of Birth/Gender: Treating RN: 08-15-1946 (77 y.o. [name] Primary [name] Physician: [name] Other Clinician: Referring Physician: Treating Physician/Extender: [name] in Treatment: 4 Education Assessment Education Provided To: Patient Education Topics Provided Wound/Skin Impairment: Handouts: Caring for Your Ulcer Methods: Explain/Verbal Responses: State content correctly Electronic Signature(s) Signed: 05/26/2023 3:42:23 PM By: [name] Entered By: Yevonne Pax on 05/23/2023 08:59:55 Macgregor, Nestor Ramp ([id number]) [id number] Page 6 of 7 --------------------------------------------------------------------------------  Wound Assessment Details Patient Name: Date of Service: [name], [name] 05/23/2023 8:45 A M Medical Record Number: [id number] Patient Account Number: [id number] Date of Birth/Sex: Treating RN: January 15, 1946 (77 y.o. [name] Primary [name] Anaston Koehn: [name] Other  Clinician: Referring Anabelle Bungert: Treating Shakerria Parran/Extender: [name] in Treatment: 4 Wound Status Wound Number: 1 Primary Etiology: Diabetic Wound/Ulcer of the Lower Extremity Wound Location: Left, Distal, Lateral Lower Leg Wound Status: Open Wounding Event: Trauma Comorbid History: Lymphedema, Hypertension, Type II Diabetes Date Acquired: 02/07/2023 Weeks Of Treatment: 4 Clustered Wound: No Photos Wound Measurements Length: (cm) 0.5 Width: (cm) 0.9 Depth: (cm) 0.2 Area: (cm) 0.353 Volume: (cm) 0.071 % Reduction in Area: 0% % Reduction in Volume: 33% Epithelialization: None Tunneling: No Undermining: No Wound Description Classification: Grade 1 Exudate Amount: Medium Exudate Type: Serosanguineous Exudate Color: red, brown Foul Odor After Cleansing: No Slough/Fibrino Yes Wound Bed Granulation Amount: Medium (34-66%) Exposed Structure Granulation Quality: Red Fascia Exposed: No Necrotic Amount: Medium (34-66%) Fat Layer (Subcutaneous Tissue) Exposed: Yes Necrotic Quality: Adherent Slough Tendon Exposed: No Muscle Exposed: No Joint Exposed: No Bone Exposed: No Treatment Notes Wound #1 (Lower Leg) Wound Laterality: Left, Lateral, Distal Cleanser [employer]ram Ancillary Kit - 15 Day Supply Discharge Instruction: Use supplies as instructed; Kit contains: (15) Saline Bullets; (15) 3x3 Gauze; 15 pr Gloves Peri-Wound [name] ADRA, SHEPLER J ([id number]) [id number] Page 7 of 7 Topical Primary Dressing Prisma 4.34 (in) Discharge Instruction: Moisten w/normal saline or sterile water; Cover wound as directed. Do not remove from wound bed. Secondary Dressing ABD Pad 5x9 (in/in) Discharge Instruction: Cover with ABD pad Kerlix 4.5 x 4.1 (in/yd) Discharge Instruction: Apply Kerlix 4.5 x 4.1 (in/yd) as instructed Secured With Medipore T - 101M Medipore H Soft Cloth Surgical T ape ape, 2x2 (in/yd) Compression Wrap Compression  Stockings Add-Ons Electronic Signature(s) Signed: 05/26/2023 3:42:23 PM By: [name] Entered By: Yevonne Pax on 05/23/2023 08:57:48 -------------------------------------------------------------------------------- Vitals Details Patient Name: Date of Service: [name], [name] J. 05/23/2023 8:45 A M Medical Record Number: [id number] Patient Account Number: [id number] Date of Birth/Sex: Treating RN: 1946-04-17 (77 y.o. [name] Primary [name] Ezana Hubbert: [name] Other Clinician: Referring Kalyse Meharg: Treating Rylen Hou/Extender: [name] in Treatment: 4 Vital Signs Time Taken: 08:53 Temperature (F): 98.4 Height (in): 68 Pulse (bpm): 73 Weight (lbs): 200 Respiratory Rate (breaths/min): 18 Body Mass Index (BMI): 30.4 Blood Pressure (mmHg): 144/71 Reference Range: 80 - 120 mg / dl Electronic Signature(s) Signed: 05/26/2023 3:42:23 PM By: [name] Entered By: Yevonne Pax on 05/23/2023 08:54:08

## 2023-05-30 ENCOUNTER — Encounter: Payer: Federal, State, Local not specified - PPO | Admitting: Internal Medicine

## 2023-05-30 DIAGNOSIS — E11622 Type 2 diabetes mellitus with other skin ulcer: Secondary | ICD-10-CM | POA: Diagnosis not present

## 2023-05-31 NOTE — Progress Notes (Signed)
[name] ([id number]) [id number] Page 1 of 6 Visit Report for 05/30/2023 HPI Details Patient Name: Date of Service: [name], [name] 05/30/2023 8:45 A M Medical Record Number: [id number] Patient Account Number: [id number] Date of Birth/Sex: Treating RN: 24-May-1946 (77 y.o. [name] Primary Care Provider: [name] Other Clinician: Referring Provider: Treating Provider/Extender: Chauncey Mann, MICHA EL G FItzgerald, Reece Packer in Treatment: 5 History of Present Illness HPI Description: 04-22-2023 upon evaluation patient appears to be doing somewhat poorly in regard to a wound on the left distal/lateral lower extremity and about the ankle region. She tells me this occurred on February 07, 2023 as a result of trying to get a very tight and new compression sock off. It was getting stuck and she somewhat twisted trying to get it off and pulled skin off. Since that time she has been having a hard time getting this to heal. Fortunately there does not appear to be any signs of active infection which is great news. She did have an ABI of 0.75 today. With that being said I am actually decently pleased with how the wound looks it does have some necrotic tissue but there is going to need to be some sharp debridement to clear away some of the dead tissue as well today. Patient does have a history of diabetes mellitus type 2, lymphedema, hypertension, and chronic venous insufficiency. 05-02-2023 upon evaluation today patient appears to be doing well currently in regard to her wound. She is actually showing signs of improvement and very pleased in that regard and fortunately I do not see any signs of active infection locally or systemically at this time which is great news. No fevers, chills, nausea, vomiting, or diarrhea. 05-09-2023 upon evaluation today patient appears to be doing well currently in regard to her wound is not worse but is also not better. I discussed  with her again today that we will get a need to likely compression wrap for her. With that being said she is adamantly opposed to this and wants to get 1 more week given something to try and I recommend to be seen by double layer Tubigrip to see if this can be beneficial. 05-17-2023 upon evaluation today patient appears to be doing well currently in regard to her wound although it still very inflamed on the posterior aspect of her ankle. I discussed that with her today. With that being said I do not see any evidence of general worsening which is great news but also do not see a significant amount of improvement compared to last week in my opinion. I discussed this with her at this point today. 05-23-2023 upon evaluation today patient appears to be doing well currently in regard to her wound. This is actually showing some slough and biofilm on the surface of the wound. The good news is I do believe that we cleaned some of the soften the scar ischial little bit of improvement with that being said the periwound is being very irritated I think the Vashe may have irritated her based on what she is telling me. Fortunately I do not see any evidence of active infection locally nor systemically at this time which is great news. No fevers, chills, nausea, vomiting, or diarrhea. 7/22; wound on the left lateral lower leg in the setting of significant chronic venous hypertension. We have been using Prisma. She would not allow a compression wrap although she is using a support stocking. Electronic Signature(s) Signed: 05/31/2023 4:43:46 PM By: Leanord Hawking,  Casimiro Needle MD Entered By: Baltazar Najjar on 05/30/2023 09:44:48 -------------------------------------------------------------------------------- Physical Exam Details Patient Name: Date of Service: Christine, [name] 05/30/2023 8:45 A M Medical Record Number: [id number] Patient Account Number: [id number] Date of Birth/Sex: Treating RN: Jan 11, 1946 (77 y.o. Christine, [name], [name] ([id number]) [id number] Page 2 of 6 Primary Care Provider: [name] Other Clinician: Referring Provider: Treating Provider/Extender: RO BSO N, MICHA EL G FItzgerald, Reece Packer in Treatment: 5 Constitutional Sitting or standing Blood Pressure is within target range for patient.. Pulse regular and within target range for patient.Marland Kitchen Respirations regular, non-labored and within target range.. Temperature is normal and within the target range for the patient.Marland Kitchen appears in no distress. Cardiovascular Patient clearly has chronic venous hypertension with dilated veins in the ankle area. Peripheral pulses are palpable in the left foot. Minimal edema. Notes Wound exam; left lateral lower leg. Small wound under illumination does not have a viable surface although she will not allow mechanical debridement today. There is no surrounding infection. Her edema is reasonably well-controlled Electronic Signature(s) Signed: 05/31/2023 4:43:46 PM By: Baltazar Najjar MD Entered By: Baltazar Najjar on 05/30/2023 09:50:12 -------------------------------------------------------------------------------- Physician Orders Details Patient Name: Date of Service: [name]. 05/30/2023 8:45 A M Medical Record Number: [id number] Patient Account Number: [id number] Date of Birth/Sex: Treating RN: 27-Mar-1946 (77 y.o. [name] Primary Care Provider: [name] Other Clinician: Referring Provider: Treating Provider/Extender: RO BSO Dorris Carnes, MICHA EL G FItzgerald, Reece Packer in Treatment: 5 Verbal / Phone Orders: No Diagnosis Coding Follow-up Appointments Return Appointment in 1 week. Bathing/ Shower/ Hygiene May shower; gently cleanse wound with antibacterial soap, rinse and pat dry prior to dressing wounds Edema Control - Lymphedema / Segmental Compressive Device / Other Patient to wear own compression stockings. Remove compression  stockings every night before going to bed and put on every morning when getting up. - bi lat Elevate, Exercise Daily and A void Standing for Long Periods of Time. Elevate legs to the level of the heart and pump ankles as often as possible Elevate leg(s) parallel to the floor when sitting. Wound Treatment Wound #1 - Lower Leg Wound Laterality: Left, Lateral, Distal Cleanser: Byram Ancillary Kit - 15 Day Supply (Generic) 3 x Per Week/30 Days Discharge Instructions: Use supplies as instructed; Kit contains: (15) Saline Bullets; (15) 3x3 Gauze; 15 pr Gloves Prim Dressing: Prisma 4.34 (in) 3 x Per Week/30 Days ary Discharge Instructions: Moisten w/normal saline or sterile water; Cover wound as directed. Do not remove from wound bed. Secondary Dressing: ABD Pad 5x9 (in/in) 3 x Per Week/30 Days Discharge Instructions: Cover with ABD pad Secondary Dressing: Kerlix 4.5 x 4.1 (in/yd) 3 x Per Week/30 Days Discharge Instructions: Apply Kerlix 4.5 x 4.1 (in/yd) as instructed Secured With: Medipore T - 61M Medipore H Soft Cloth Surgical T ape ape, 2x2 (in/yd) 3 x Per Week/30 Days JACLYNE, HAVERSTICK ([id number]) [phone].pdf Page 3 of 6 Electronic Signature(s) Signed: 05/31/2023 4:16:14 PM By: [name] Signed: 05/31/2023 4:43:46 PM By: Baltazar Najjar MD Entered By: Yevonne Pax on 05/30/2023 09:31:27 -------------------------------------------------------------------------------- Problem List Details Patient Name: Date of Service: [name], Nestor Ramp. 05/30/2023 8:45 A M Medical Record Number: [id number] Patient Account Number: [id number] Date of Birth/Sex: Treating RN: June 25, 1946 (77 y.o. 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/14/2024 No Yes I10 Essential (primary) hypertension 04/22/2023 No Yes Inactive Problems Resolved Problems Electronic Signature(s) Signed: 05/31/2023 4:43:46 PM By: Baltazar Najjar MD Entered By: Baltazar Najjar on 05/30/2023 09:43:57 Schaffer, Nestor Ramp ([id number]) [id number] Page 4 of 6 -------------------------------------------------------------------------------- Progress Note Details Patient Name: Date of Service: MONEE, DEMBECK 05/30/2023 8:45 A M Medical Record Number: [id number] Patient Account Number: [id number] Date of Birth/Sex: Treating RN: 12/19/1945 (77 y.o. [name] Primary Care Provider: [name] Other Clinician: Referring Provider: Treating Provider/Extender: Chauncey Mann, MICHA EL G FItzgerald, Reece Packer in Treatment: 5 Subjective History of Present Illness (HPI) 04-22-2023 upon evaluation patient appears to be doing somewhat poorly in regard to a wound on the left distal/lateral lower extremity and about the ankle region. She tells me this occurred on February 07, 2023 as a result of trying to get a very tight and new compression sock off. It was getting stuck and she somewhat twisted trying to get it off and pulled skin off. Since that time she has been having a hard time getting this to heal. Fortunately there does not appear to be any signs of active infection which is great news. She did have an ABI of 0.75 today. With that being said I am actually decently pleased with how the wound looks it does have some necrotic tissue but there is going to need to be some sharp debridement to clear away some of the dead  tissue as well today. Patient does have a history of diabetes mellitus type 2, lymphedema, hypertension, and chronic venous insufficiency. 05-02-2023 upon evaluation today patient appears to be doing well currently in regard to her wound. She is actually showing signs of improvement and very pleased in that regard and fortunately I do not see any signs of active infection locally or systemically at this time which is great news. No fevers, chills, nausea, vomiting, or diarrhea. 05-09-2023 upon evaluation today patient appears to be doing well currently in regard to her wound is not worse but is also not better. I discussed with her again today that we will get a need to likely compression wrap for her. With that being said she is adamantly opposed to this and wants to get 1 more week given something to try and I recommend to be seen by double layer Tubigrip to see if this can be beneficial. 05-17-2023 upon evaluation today patient appears to be doing well currently in regard to her wound although it still very inflamed on the posterior aspect of her ankle. I discussed that with her today. With that being said I do not see any evidence of general worsening which is great news but also do not see a significant amount of improvement compared to last week in my opinion. I discussed this with her at this point today. 05-23-2023 upon evaluation today patient appears to be doing well currently in regard to her wound. This is actually showing some slough and biofilm on the surface of the wound. The good news is I do believe that we cleaned some of the soften the scar ischial little bit of improvement with that being said the periwound is being very irritated I think the Vashe may have irritated her based on  what she is telling me. Fortunately I do not see any evidence of active infection locally nor systemically at this time which is great news. No fevers, chills, nausea, vomiting, or diarrhea. 7/22; wound on the  left lateral lower leg in the setting of significant chronic venous hypertension. We have been using Prisma. She would not allow a compression wrap although she is using a support stocking. Objective Constitutional Sitting or standing Blood Pressure is within target range for patient.. Pulse regular and within target range for patient.Marland Kitchen Respirations regular, non-labored and within target range.. Temperature is normal and within the target range for the patient.Marland Kitchen appears in no distress. Vitals Time Taken: 8:55 AM, Height: 68 in, Weight: 200 lbs, BMI: 30.4, Temperature: 97.7 F, Pulse: 76 bpm, Respiratory Rate: 16 breaths/min, Blood Pressure: 130/71 mmHg. Cardiovascular Patient clearly has chronic venous hypertension with dilated veins in the ankle area. Peripheral pulses are palpable in the left foot. Minimal edema. General Notes: Wound exam; left lateral lower leg. Small wound under illumination does not have a viable surface although she will not allow mechanical debridement today. There is no surrounding infection. Her edema is reasonably well-controlled Integumentary (Hair, Skin) Wound #1 status is Open. Original cause of wound was Trauma. The date acquired was: 02/07/2023. The wound has been in treatment 5 weeks. The wound is located on the Left,Distal,Lateral Lower Leg. The wound measures 0.5cm length x 0.7cm width x 0.2cm depth; 0.275cm^2 area and 0.055cm^3 volume. There is Fat Layer (Subcutaneous Tissue) exposed. There is no tunneling or undermining noted. There is a medium amount of serosanguineous drainage noted. There is medium (34-66%) red granulation within the wound bed. There is a medium (34-66%) amount of necrotic tissue within the wound bed including Adherent Slough. LAMARIA, HILDEBRANDT ([id number]) [id number] Page 5 of 6 Assessment Active Problems ICD-10 Chronic venous hypertension (idiopathic) with ulcer and inflammation of left lower extremity Type 2  diabetes mellitus with other skin ulcer Lymphedema, not elsewhere classified Non-pressure chronic ulcer of other part of left lower leg with fat layer exposed Essential (primary) hypertension Plan Follow-up Appointments: Return Appointment in 1 week. Bathing/ Shower/ Hygiene: May shower; gently cleanse wound with antibacterial soap, rinse and pat dry prior to dressing wounds Edema Control - Lymphedema / Segmental Compressive Device / Other: Patient to wear own compression stockings. Remove compression stockings every night before going to bed and put on every morning when getting up. - bi lat Elevate, Exercise Daily and Avoid Standing for Long Periods of Time. Elevate legs to the level of the heart and pump ankles as often as possible Elevate leg(s) parallel to the floor when sitting. WOUND #1: - Lower Leg Wound Laterality: Left, Lateral, Distal Cleanser: Byram Ancillary Kit - 15 Day Supply (Generic) 3 x Per Week/30 Days Discharge Instructions: Use supplies as instructed; Kit contains: (15) Saline Bullets; (15) 3x3 Gauze; 15 pr Gloves Prim Dressing: Prisma 4.34 (in) 3 x Per Week/30 Days ary Discharge Instructions: Moisten w/normal saline or sterile water; Cover wound as directed. Do not remove from wound bed. Secondary Dressing: ABD Pad 5x9 (in/in) 3 x Per Week/30 Days Discharge Instructions: Cover with ABD pad Secondary Dressing: Kerlix 4.5 x 4.1 (in/yd) 3 x Per Week/30 Days Discharge Instructions: Apply Kerlix 4.5 x 4.1 (in/yd) as instructed Secured With: Medipore T - 74M Medipore H Soft Cloth Surgical T ape ape, 2x2 (in/yd) 3 x Per Week/30 Days 1. I did not change the primary dressing which was Prisma. She is using her own compression/support stocking 2.  If this wound does not progress towards closure it is going to require mechanical debridement debridement and/or a change in dressing next week 3 finally has significant chronic venous hypertension again if this wound stalls she might  do better with wrap compression. Electronic Signature(s) Signed: 05/31/2023 4:43:46 PM By: Baltazar Najjar MD Entered By: Baltazar Najjar on 05/30/2023 09:52:30 -------------------------------------------------------------------------------- SuperBill Details Patient Name: Date of Service: [name], Nestor Ramp 05/30/2023 Medical Record Number: [id number] Patient Account Number: [id number] Date of Birth/Sex: Treating RN: 04-Feb-1946 (77 y.o. [name] Primary Care Provider: [name] Other Clinician: Referring Provider: Treating Provider/Extender: RO BSO N, MICHA EL G FItzgerald, Reece Packer in Treatment: 5 Diagnosis Coding ICD-10 Codes Code Description [phone] Chronic venous hypertension (idiopathic) with ulcer and inflammation of left lower extremity BALBINA, DEPACE ([id number]) [id number] Page 6 of 6 E11.622 Type 2 diabetes mellitus with other skin ulcer I89.0 Lymphedema, not elsewhere classified L97.822 Non-pressure chronic ulcer of other part of left lower leg with fat layer exposed I10 Essential (primary) hypertension Facility Procedures : CPT4 Code: [id number] Description: 41660 - WOUND CARE VISIT-LEV 2 EST PT Modifier: Quantity: 1 Physician Procedures : CPT4 Code Description Modifier [id number] - WC PHYS LEVEL 3 - EST PT ICD-10 Diagnosis Description I87.332 Chronic venous hypertension (idiopathic) with ulcer and inflammation of left lower extremity L97.822 Non-pressure chronic ulcer of other part  of left lower leg with fat layer exposed Quantity: 1 Electronic Signature(s) Signed: 05/31/2023 4:43:46 PM By: Baltazar Najjar MD Entered By: Baltazar Najjar on 05/30/2023 09:52:49

## 2023-05-31 NOTE — Progress Notes (Signed)
[facility], [place] ([id number]) [id number] Page 1 of 9 Visit Report for 05/30/2023 Arrival Information Details Patient Name: Date of Service: [name], [name] 05/30/2023 8:45 A M Medical Record Number: [id number] Patient Account Number: [id number] Date of Birth/Sex: Treating RN: Aug 23, 1946 (77 y.o. [name] Primary [name] Servando Kyllonen: [name] Other Clinician: Referring Alastor Kneale: Treating Vanesa Renier/Extender: RO BSO N, MICHA EL G FItzgerald, Reece Packer in Treatment: 5 Visit Information History Since Last Visit Added or deleted any medications: No Patient Arrived: Ambulatory Any new allergies or adverse reactions: No Arrival Time: 08:52 Had a fall or experienced change in No Accompanied By: self activities of daily living that may affect Transfer Assistance: None risk of falls: Patient Identification Verified: Yes Signs or symptoms of abuse/neglect since last visito No Secondary Verification Process Completed: Yes Hospitalized since last visit: No Patient Requires Transmission-Based Precautions: No Implantable device outside of the clinic excluding No Patient Has Alerts: No cellular tissue based products placed in the center since last visit: Has Dressing in Place as Prescribed: Yes Pain Present Now: No Electronic Signature(s) Signed: 05/31/2023 4:16:14 PM By: [name] Entered By: Yevonne Pax on 05/30/2023 08:55:46 -------------------------------------------------------------------------------- Clinic Level of [name] Assessment Details Patient Name: Date of Service: [name], [name] 05/30/2023 8:45 A M Medical Record Number: [id number] Patient Account Number: [id number] Date of Birth/Sex: Treating RN: 1946/02/20 (77 y.o. [name] Primary [name] Daylon Lafavor: [name] Other Clinician: Referring Burley Kopka: Treating Darah Simkin/Extender: RO BSO N, MICHA EL G FItzgerald, Reece Packer in Treatment: 5 Clinic Level of [name]  Assessment Items TOOL 4 Quantity Score X- 1 0 Use when only an EandM is performed on FOLLOW-UP visit ASSESSMENTS - Nursing Assessment / Reassessment X- 1 10 Reassessment of Co-morbidities (includes updates in patient status) X- 1 5 Reassessment of Adherence to Treatment Plan [name], [name] ([id number]) [id number] Page 2 of 9 ASSESSMENTS - Wound and Skin A ssessment / Reassessment X - Simple Wound Assessment / Reassessment - one wound 1 5 []  - 0 Complex Wound Assessment / Reassessment - multiple wounds []  - 0 Dermatologic / Skin Assessment (not related to wound area) ASSESSMENTS - Focused Assessment []  - 0 Circumferential Edema Measurements - multi extremities []  - 0 Nutritional Assessment / Counseling / Intervention []  - 0 Lower Extremity Assessment (monofilament, tuning fork, pulses) []  - 0 Peripheral Arterial Disease Assessment (using hand held doppler) ASSESSMENTS - Ostomy and/or Continence Assessment and [name] []  - 0 Incontinence Assessment and Management []  - 0 Ostomy [name] Assessment and Management (repouching, etc.) PROCESS - Coordination of [name] X - Simple Patient / Family Education for ongoing [name] 1 15 []  - 0 Complex (extensive) Patient / Family Education for ongoing [name] []  - 0 Staff obtains Chiropractor, Records, T Results / Process Orders est []  - 0 Staff telephones HHA, Nursing Homes / Clarify orders / etc []  - 0 Routine Transfer to another Facility (non-emergent condition) []  - 0 Routine Hospital Admission (non-emergent condition) []  - 0 New Admissions / Manufacturing engineer / Ordering NPWT Apligraf, etc. , []  - 0 Emergency Hospital Admission (emergent condition) X- 1 10 Simple Discharge Coordination []  - 0 Complex (extensive) Discharge Coordination PROCESS - Special Needs []  - 0 Pediatric / Minor Patient Management []  - 0 Isolation Patient Management []  - 0 Hearing / Language / Visual special needs []  -  0 Assessment of Community assistance (transportation, D/C planning, etc.) []  - 0 Additional assistance / Altered mentation []  - 0 Support Surface(s) Assessment (bed, cushion, seat, etc.) INTERVENTIONS -  Wound Cleansing / Measurement X - Simple Wound Cleansing - one wound 1 5 []  - 0 Complex Wound Cleansing - multiple wounds X- 1 5 Wound Imaging (photographs - any number of wounds) []  - 0 Wound Tracing (instead of photographs) X- 1 5 Simple Wound Measurement - one wound []  - 0 Complex Wound Measurement - multiple wounds INTERVENTIONS - Wound Dressings X - Small Wound Dressing one or multiple wounds 1 10 []  - 0 Medium Wound Dressing one or multiple wounds []  - 0 Large Wound Dressing one or multiple wounds []  - 0 Application of Medications - topical []  - 0 Application of Medications - injection INTERVENTIONS - Miscellaneous []  - 0 External ear exam [name], [name] ([id number]) [id number] Page 3 of 9 []  - 0 Specimen Collection (cultures, biopsies, blood, body fluids, etc.) []  - 0 Specimen(s) / Culture(s) sent or taken to Lab for analysis []  - 0 Patient Transfer (multiple staff / Michiel Sites Lift / Similar devices) []  - 0 Simple Staple / Suture removal (25 or less) []  - 0 Complex Staple / Suture removal (26 or more) []  - 0 Hypo / Hyperglycemic Management (close monitor of Blood Glucose) []  - 0 Ankle / Brachial Index (ABI) - do not check if billed separately X- 1 5 Vital Signs Has the patient been seen at the hospital within the last three years: Yes Total Score: 75 Level Of [name]: New/Established - Level 2 Electronic Signature(s) Signed: 05/31/2023 4:16:14 PM By: [name] Entered By: Yevonne Pax on 05/30/2023 09:33:33 -------------------------------------------------------------------------------- Encounter Discharge Information Details Patient Name: Date of Service: [name], [name] J. 05/30/2023 8:45 A M Medical Record Number:  [id number] Patient Account Number: [id number] Date of Birth/Sex: Treating RN: 15-Mar-1946 (77 y.o. [name] Primary [name] Levell Tavano: [name] Other Clinician: Referring Leota Maka: Treating Yavier Snider/Extender: RO BSO Dorris Carnes, MICHA EL G FItzgerald, Reece Packer in Treatment: 5 Encounter Discharge Information Items Discharge Condition: Stable Ambulatory Status: Ambulatory Discharge Destination: Home Transportation: Private Auto Accompanied By: self Schedule Follow-up Appointment: Yes Clinical Summary of [name]: Electronic Signature(s) Signed: 05/31/2023 4:16:14 PM By: [name] Entered By: Yevonne Pax on 05/30/2023 09:34:27 -------------------------------------------------------------------------------- Lower Extremity Assessment Details Patient Name: Date of Service: [name], [name] 05/30/2023 8:45 A Jake Shark, Nestor Ramp ([id number]) [id number] Page 4 of 9 Medical Record Number: [id number] Patient Account Number: [id number] Date of Birth/Sex: Treating RN: 1946-07-18 (77 y.o. [name] Primary [name] Tadeo Besecker: [name] Other Clinician: Referring Rollyn Scialdone: Treating Constance Hackenberg/Extender: RO BSO N, MICHA EL G FItzgerald, Reece Packer in Treatment: 5 Edema Assessment Assessed: [Left: No] [Right: No] Edema: [Left: Ye] [Right: s] Calf Left: Right: Point of Measurement: 35 cm From Medial Instep 37 cm Ankle Left: Right: Point of Measurement: 10 cm From Medial Instep 22 cm Vascular Assessment Pulses: Dorsalis Pedis Palpable: [Left:Yes] Electronic Signature(s) Signed: 05/31/2023 4:16:14 PM By: [name] Entered By: Yevonne Pax on 05/30/2023 09:00:25 -------------------------------------------------------------------------------- Multi Wound Chart Details Patient Name: Date of Service: [name], [name] J. 05/30/2023 8:45 A M Medical Record Number: [id number] Patient Account Number: [id number] Date of Birth/Sex: Treating  RN: [birth year] (77 y.o. [name] Primary [name] Ceasia Elwell: [name] Other Clinician: Referring Angline Schweigert: Treating Kolbi Tofte/Extender: RO BSO N, MICHA EL G FItzgerald, Reece Packer in Treatment: 5 Vital Signs Height(in): 68 Pulse(bpm): 76 Weight(lbs): 200 Blood Pressure(mmHg): 130/71 Body Mass Index(BMI): 30.4 Temperature(F): 97.7 Respiratory Rate(breaths/min): 16 [1:Photos:] [N/A:N/A] Left, Distal, Lateral Lower Leg N/A N/A Wound Location: Trauma N/A N/A Wounding Event: Diabetic Wound/Ulcer of the Lower N/A  N/A Primary EtiologyMarland Kitchen FADUMA, CHO ([id number]) [phone].pdf Page 5 of 9 Extremity Lymphedema, Hypertension, Type II N/A N/A Comorbid History: Diabetes 02/07/2023 N/A N/A Date Acquired: 5 N/A N/A Weeks of Treatment: Open N/A N/A Wound Status: No N/A N/A Wound Recurrence: 0.5x0.7x0.2 N/A N/A Measurements L x W x D (cm) 0.275 N/A N/A A (cm) : rea 0.055 N/A N/A Volume (cm) : 22.10% N/A N/A % Reduction in A rea: 48.10% N/A N/A % Reduction in Volume: Grade 1 N/A N/A Classification: Medium N/A N/A Exudate A mount: Serosanguineous N/A N/A Exudate Type: red, brown N/A N/A Exudate Color: Medium (34-66%) N/A N/A Granulation A mount: Red N/A N/A Granulation Quality: Medium (34-66%) N/A N/A Necrotic A mount: Fat Layer (Subcutaneous Tissue): Yes N/A N/A Exposed Structures: Fascia: No Tendon: No Muscle: No Joint: No Bone: No None N/A N/A Epithelialization: Treatment Notes Electronic Signature(s) Signed: 05/31/2023 4:16:14 PM By: [name] Entered By: Yevonne Pax on 05/30/2023 09:00:40 -------------------------------------------------------------------------------- Multi-Disciplinary [name] Plan Details Patient Name: Date of Service: [name], [name] J. 05/30/2023 8:45 A M Medical Record Number: [id number] Patient Account Number: [id number] Date of Birth/Sex: Treating RN: [birth year] (77 y.o. [name] Primary [name] Raelin Pixler: [name] Other Clinician: Referring Lavera Vandermeer: Treating Severiano Utsey/Extender: RO BSO Dorris Carnes, MICHA EL G FItzgerald, Reece Packer in Treatment: 5 Active Inactive Necrotic Tissue Nursing Diagnoses: Knowledge deficit related to management of necrotic/devitalized tissue Goals: Patient/caregiver will verbalize understanding of reason and process for debridement of necrotic tissue Date Initiated: 04/22/2023 Target Resolution Date: 05/22/2023 Goal Status: Active Interventions: Assess patient pain level pre-, during and post procedure and prior to discharge Notes: Wound/Skin Impairment Nursing Diagnoses: ANAMIKA, KUEKER ([id number]) [phone].pdf Page 6 of 9 Knowledge deficit related to ulceration/compromised skin integrity Goals: Patient/caregiver will verbalize understanding of skin [name] regimen Date Initiated: 04/22/2023 Target Resolution Date: 05/22/2023 Goal Status: Active Ulcer/skin breakdown will have a volume reduction of 30% by week 4 Date Initiated: 04/22/2023 Target Resolution Date: 05/22/2023 Goal Status: Active Ulcer/skin breakdown will have a volume reduction of 50% by week 8 Date Initiated: 04/22/2023 Target Resolution Date: 06/22/2023 Goal Status: Active Ulcer/skin breakdown will have a volume reduction of 80% by week 12 Date Initiated: 04/22/2023 Target Resolution Date: 07/23/2023 Goal Status: Active Ulcer/skin breakdown will heal within 14 weeks Date Initiated: 04/22/2023 Target Resolution Date: 08/22/2023 Goal Status: Active Interventions: Assess patient/caregiver ability to obtain necessary supplies Assess patient/caregiver ability to perform ulcer/skin [name] regimen upon admission and as needed Assess ulceration(s) every visit Notes: Electronic Signature(s) Signed: 05/31/2023 4:16:14 PM By: [name] Entered By: Yevonne Pax on 05/30/2023  09:00:57 -------------------------------------------------------------------------------- Pain Assessment Details Patient Name: Date of Service: [name], [name] 05/30/2023 8:45 A M Medical Record Number: [id number] Patient Account Number: [id number] Date of Birth/Sex: Treating RN: [birth year] (77 y.o. [name] Primary [name] Kenshin Splawn: [name] Other Clinician: Referring Jodey Burbano: Treating Alakai Macbride/Extender: RO BSO Dorris Carnes, MICHA EL G FItzgerald, Reece Packer in Treatment: 5 Active Problems Location of Pain Severity and Description of Pain Patient Has Paino No Site Locations [place], [place] J ([id number]) [id number] Page 7 of 9 Pain Management and Medication Current Pain Management: Electronic Signature(s) Signed: 05/31/2023 4:16:14 PM By: [name] Entered By: Yevonne Pax on 05/30/2023 08:57:01 -------------------------------------------------------------------------------- Patient/Caregiver Education Details Patient Name: Date of Service: [name] 7/22/2024andnbsp8:45 A M Medical Record Number: [id number] Patient Account Number: [id number] Date of Birth/Gender: Treating RN: June 08, 1946 (77 y.o. [name] Primary [name] Physician: [name] Other Clinician: Referring Physician: Treating Physician/Extender: RO BSO  N, MICHA EL G FItzgerald, Reece Packer in Treatment: 5 Education Assessment Education Provided To: Patient Education Topics Provided Wound/Skin Impairment: Handouts: Caring for Your Ulcer Methods: Explain/Verbal Responses: State content correctly Electronic Signature(s) Signed: 05/31/2023 4:16:14 PM By: [name] Entered By: Yevonne Pax on 05/30/2023 09:01:11 -------------------------------------------------------------------------------- Wound Assessment Details Patient Name: Date of Service: [name], [name] 05/30/2023 8:45 A M Medical Record Number: [id number] Patient Account Number:  [id number] Date of Birth/Sex: Treating RN: June 21, 1946 (77 y.o. [name] Primary [name] Grantley Savage: [name] Other Clinician: Referring Ali Mohl: Treating Jeffie Spivack/Extender: RO BSO N, MICHA EL G FItzgerald, Reece Packer in Treatment: 5 Wound Status Wound Number: 1 Primary Etiology: Diabetic Wound/Ulcer of the Lower Extremity [name], [name] ([id number]) [id number] Page 8 of 9 Wound Location: Left, Distal, Lateral Lower Leg Wound Status: Open Wounding Event: Trauma Comorbid History: Lymphedema, Hypertension, Type II Diabetes Date Acquired: 02/07/2023 Weeks Of Treatment: 5 Clustered Wound: No Photos Wound Measurements Length: (cm) 0.5 Width: (cm) 0.7 Depth: (cm) 0.2 Area: (cm) 0.275 Volume: (cm) 0.055 % Reduction in Area: 22.1% % Reduction in Volume: 48.1% Epithelialization: None Tunneling: No Undermining: No Wound Description Classification: Grade 1 Exudate Amount: Medium Exudate Type: Serosanguineous Exudate Color: red, brown Foul Odor After Cleansing: No Slough/Fibrino Yes Wound Bed Granulation Amount: Medium (34-66%) Exposed Structure Granulation Quality: Red Fascia Exposed: No Necrotic Amount: Medium (34-66%) Fat Layer (Subcutaneous Tissue) Exposed: Yes Necrotic Quality: Adherent Slough Tendon Exposed: No Muscle Exposed: No Joint Exposed: No Bone Exposed: No Treatment Notes Wound #1 (Lower Leg) Wound Laterality: Left, Lateral, Distal Cleanser [place] Ancillary Kit - 15 Day Supply Discharge Instruction: Use supplies as instructed; Kit contains: (15) Saline Bullets; (15) 3x3 Gauze; 15 pr Gloves Peri-Wound [name] Topical Primary Dressing Prisma 4.34 (in) Discharge Instruction: Moisten w/normal saline or sterile water; Cover wound as directed. Do not remove from wound bed. Secondary Dressing ABD Pad 5x9 (in/in) Discharge Instruction: Cover with ABD pad Kerlix 4.5 x 4.1 (in/yd) Discharge Instruction: Apply Kerlix 4.5 x 4.1  (in/yd) as instructed Secured With Medipore T - 13M Medipore H Soft Cloth Surgical T ape ape, 2x2 (in/yd) Compression Wrap Compression Stockings Add-Ons Electronic Signature(s) Signed: 05/31/2023 4:16:14 PM By: [name] Biven, Nestor Ramp [phone] By: [name] [phone].pdf Page 9 of 9 Signed: 05/31/2023 4:16:14 Entered By: Yevonne Pax on 05/30/2023 08:59:43 -------------------------------------------------------------------------------- Vitals Details Patient Name: Date of Service: [name], [name] 05/30/2023 8:45 A M Medical Record Number: [id number] Patient Account Number: [id number] Date of Birth/Sex: Treating RN: July 10, 1946 (77 y.o. [name] Primary [name] Hudson Lehmkuhl: [name] Other Clinician: Referring Rameses Ou: Treating Elleanna Melling/Extender: RO BSO N, MICHA EL G FItzgerald, Reece Packer in Treatment: 5 Vital Signs Time Taken: 08:55 Temperature (F): 97.7 Height (in): 68 Pulse (bpm): 76 Weight (lbs): 200 Respiratory Rate (breaths/min): 16 Body Mass Index (BMI): 30.4 Blood Pressure (mmHg): 130/71 Reference Range: 80 - 120 mg / dl Electronic Signature(s) Signed: 05/31/2023 4:16:14 PM By: [name] Entered By: Yevonne Pax on 05/30/2023 08:56:26

## 2023-06-06 ENCOUNTER — Encounter: Payer: Federal, State, Local not specified - PPO | Admitting: Physician Assistant

## 2023-06-06 DIAGNOSIS — E11622 Type 2 diabetes mellitus with other skin ulcer: Secondary | ICD-10-CM | POA: Diagnosis not present

## 2023-06-06 NOTE — Progress Notes (Signed)
[name], [name] ([id number]) [id number] Page 1 of 8 Visit Report for 06/06/2023 Chief Complaint Document Details Patient Name: Date of Service: KYESHIA, DECATUR 06/06/2023 8:45 A M Medical Record Number: [id number] Patient Account Number: [id number] Date of Birth/Sex: Treating RN: [birth year], [name] (77 y.o. [name] Primary [name] Provider: [name] Other Clinician: Referring Provider: Treating Provider/Extender: [name] in Treatment: 6 Information Obtained from: Patient Chief Complaint Left LE Ulcer Electronic Signature(s) Signed: 06/06/2023 8:59:42 AM By: Allen Derry PA-C Entered By: Allen Derry on 06/06/2023 08:59:42 -------------------------------------------------------------------------------- Debridement Details Patient Name: Date of Service: [name]. 06/06/2023 8:45 A M Medical Record Number: [id number] Patient Account Number: [id number] Date of Birth/Sex: Treating RN: [birth year] (77 y.o. [name] Primary [name] Provider: [name] Other Clinician: Referring Provider: Treating Provider/Extender: [name] in Treatment: 6 Debridement Performed for Assessment: Wound #1 Left,Distal,Lateral Lower Leg Performed By: Physician Allen Derry, PA-C Debridement Type: Debridement Severity of Tissue Pre Debridement: Fat layer exposed Level of Consciousness (Pre-procedure): Awake and Alert Pre-procedure Verification/Time Out Yes - 09:32 Taken: Start Time: 09:32 Percent of Wound Bed Debrided: 100% T Area Debrided (cm): otal 0.27 Tissue and other material debrided: Viable, Non-Viable, Slough, Subcutaneous, Slough Level: Skin/Subcutaneous Tissue Debridement Description: Excisional Instrument: Curette Bleeding: Minimum Hemostasis Achieved: Pressure End Time: 09:34 Procedural Pain: 0 Post Procedural Pain: 0 [name], [name] ([id number])  [id number] Page 2 of 8 Response to Treatment: Procedure was tolerated well Level of Consciousness (Post- Awake and Alert procedure): Post Debridement Measurements of Total Wound Length: (cm) 0.5 Width: (cm) 0.7 Depth: (cm) 0.2 Volume: (cm) 0.055 Character of Wound/Ulcer Post Debridement: Improved Severity of Tissue Post Debridement: Fat layer exposed Post Procedure Diagnosis Same as Pre-procedure Electronic Signature(s) Signed: 06/06/2023 3:00:23 PM By: Allen Derry PA-C Signed: 06/08/2023 4:21:14 PM By: [name] Entered By: Yevonne Pax on 06/06/2023 09:35:12 -------------------------------------------------------------------------------- HPI Details Patient Name: Date of Service: [name], [name] J. 06/06/2023 8:45 A M Medical Record Number: [id number] Patient Account Number: [id number] Date of Birth/Sex: Treating RN: [birth year], [name] (77 y.o. [name] Primary [name] Provider: [name] Other Clinician: Referring Provider: Treating Provider/Extender: [name] in Treatment: 6 History of Present Illness HPI Description: 04-22-2023 upon evaluation patient appears to be doing somewhat poorly in regard to a wound on the left distal/lateral lower extremity and about the ankle region. She tells me this occurred on February 07, 2023 as a result of trying to get a very tight and new compression sock off. It was getting stuck and she somewhat twisted trying to get it off and pulled skin off. Since that time she has been having a hard time getting this to heal. Fortunately there does not appear to be any signs of active infection which is great news. She did have an ABI of 0.75 today. With that being said I am actually decently pleased with how the wound looks it does have some necrotic tissue but there is going to need to be some sharp debridement to clear away some of the dead tissue as well today. Patient does have a history of  diabetes mellitus type 2, lymphedema, hypertension, and chronic venous insufficiency. 05-02-2023 upon evaluation today patient appears to be doing well currently in regard to her wound. She is actually showing signs of improvement and very pleased in that regard and fortunately I do not see any signs of active infection locally or systemically at this time which is great news. No fevers,  chills, nausea, vomiting, or diarrhea. 05-09-2023 upon evaluation today patient appears to be doing well currently in regard to her wound is not worse but is also not better. I discussed with her again today that we will get a need to likely compression wrap for her. With that being said she is adamantly opposed to this and wants to get 1 more week given something to try and I recommend to be seen by double layer Tubigrip to see if this can be beneficial. 05-17-2023 upon evaluation today patient appears to be doing well currently in regard to her wound although it still very inflamed on the posterior aspect of her ankle. I discussed that with her today. With that being said I do not see any evidence of general worsening which is great news but also do not see a significant amount of improvement compared to last week in my opinion. I discussed this with her at this point today. 05-23-2023 upon evaluation today patient appears to be doing well currently in regard to her wound. This is actually showing some slough and biofilm on the surface of the wound. The good news is I do believe that we cleaned some of the soften the scar ischial little bit of improvement with that being said the periwound is being very irritated I think the Vashe Christine have irritated her based on what she is telling me. Fortunately I do not see any evidence of active infection locally nor systemically at this time which is great news. No fevers, chills, nausea, vomiting, or diarrhea. 7/22; wound on the left lateral lower leg in the setting of significant  chronic venous hypertension. We have been using Prisma. She would not allow a compression wrap although she is using a support stocking. 06-06-2023 upon evaluation today patient's wound actually showing signs of significant improvement with regard to her wound. She has been tolerating the dressing changes without complication. Fortunately there does not appear to be any signs of active infection locally nor systemically at this time which is great news. No fevers, chills, nausea, vomiting, or diarrhea. [name], [name] ([id number]) [id number] Page 3 of 8 Electronic Signature(s) Signed: 06/06/2023 9:41:20 AM By: Allen Derry PA-C Entered By: Allen Derry on 06/06/2023 09:41:20 -------------------------------------------------------------------------------- Physical Exam Details Patient Name: Date of Service: [name], [name] 06/06/2023 8:45 A M Medical Record Number: [id number] Patient Account Number: [id number] Date of Birth/Sex: Treating RN: [name][birth year] (77 y.o. [name] Primary [name] Provider: [name] Other Clinician: Referring Provider: Treating Provider/Extender: [name] in Treatment: 6 Constitutional Well-nourished and well-hydrated in no acute distress. Respiratory normal breathing without difficulty. Psychiatric this patient is able to make decisions and demonstrates good insight into disease process. Alert and Oriented x 3. pleasant and cooperative. Notes Upon inspection patient's wound actually showing signs of significant improvement I am actually very pleased with where we stand today I do believe that she is making good headway towards complete closure. Overall I am extremely happy with where we stand today. Electronic Signature(s) Signed: 06/06/2023 9:41:36 AM By: Allen Derry PA-C Entered By: Allen Derry on 06/06/2023  09:41:36 -------------------------------------------------------------------------------- Physician Orders Details Patient Name: Date of Service: [name]. 06/06/2023 8:45 A M Medical Record Number: [id number] Patient Account Number: [id number] Date of Birth/Sex: Treating RN: [birth year] (77 y.o. [name] Primary [name] Provider: [name] Other Clinician: Referring Provider: Treating Provider/Extender: [name] in Treatment: 6 Verbal / Phone Orders: No Diagnosis Coding ICD-10 Coding Code Description  Z61.096 Chronic venous hypertension (idiopathic) with ulcer and inflammation of left lower extremity E11.622 Type 2 diabetes mellitus with other skin ulcer [name], COMI ([id number]) [id number] Page 4 of 8 I89.0 Lymphedema, not elsewhere classified L97.822 Non-pressure chronic ulcer of other part of left lower leg with fat layer exposed I10 Essential (primary) hypertension Follow-up Appointments Return Appointment in 1 week. Bathing/ Shower/ Hygiene Christine shower; gently cleanse wound with antibacterial soap, rinse and pat dry prior to dressing wounds Edema Control - Lymphedema / Segmental Compressive Device / Other Patient to wear own compression stockings. Remove compression stockings every night before going to bed and put on every morning when getting up. - bi lat Elevate, Exercise Daily and A void Standing for Long Periods of Time. Elevate legs to the level of the heart and pump ankles as often as possible Elevate leg(s) parallel to the floor when sitting. Wound Treatment Wound #1 - Lower Leg Wound Laterality: Left, Lateral, Distal Cleanser: Byram Ancillary Kit - 15 Day Supply (Generic) 3 x Per Week/30 Days Discharge Instructions: Use supplies as instructed; Kit contains: (15) Saline Bullets; (15) 3x3 Gauze; 15 pr Gloves Prim Dressing: Prisma 4.34 (in) 3 x Per Week/30 Days ary Discharge Instructions:  Moisten w/normal saline or sterile water; Cover wound as directed. Do not remove from wound bed. Secondary Dressing: ABD Pad 5x9 (in/in) 3 x Per Week/30 Days Discharge Instructions: Cover with ABD pad Secondary Dressing: Kerlix 4.5 x 4.1 (in/yd) 3 x Per Week/30 Days Discharge Instructions: Apply Kerlix 4.5 x 4.1 (in/yd) as instructed Secured With: Medipore T - 40M Medipore H Soft Cloth Surgical T ape ape, 2x2 (in/yd) 3 x Per Week/30 Days Electronic Signature(s) Signed: 06/06/2023 3:00:23 PM By: Allen Derry PA-C Signed: 06/08/2023 4:21:14 PM By: [name] Entered By: Yevonne Pax on 06/06/2023 09:31:31 -------------------------------------------------------------------------------- Problem List Details Patient Name: Date of Service: [name], [name] J. 06/06/2023 8:45 A M Medical Record Number: [id number] Patient Account Number: [id number] Date of Birth/Sex: Treating RN: [name][birth year] (77 y.o. 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 06/06/2023 8:45 A M Medical Record Number: [id number] Patient Account Number: [id number] Date of Birth/Sex: Treating RN: [name], [name] (77 y.o. [name] Primary [name] Provider: [name] Other Clinician: Referring Provider: Treating Provider/Extender: [name] in Treatment: 6 Subjective Chief Complaint Information obtained from Patient Left LE Ulcer History of Present Illness (HPI) 04-22-2023 upon evaluation patient appears to be doing somewhat poorly in regard to a wound on the left distal/lateral lower extremity and about the ankle region. She tells me this occurred on February 07, 2023 as a result of trying to get a very tight and new compression sock off. It was getting stuck and she somewhat twisted trying to get it off and  pulled skin off. Since that time she has been having a hard time getting this to heal. Fortunately there does not appear to be any signs of active infection which is great news. She did have an ABI of 0.75 today. With that being said I am actually decently pleased with how the wound looks it does have some necrotic tissue but there is going to need to be some sharp debridement to clear away some of the dead tissue as well today. Patient does have a history of diabetes mellitus type 2, lymphedema, hypertension, and chronic venous insufficiency. 05-02-2023 upon evaluation today patient appears to be doing well currently in regard to her wound. She is actually showing signs of improvement and very pleased in that regard and fortunately I do not see any signs of active infection locally or systemically at this time which is great news. No fevers, chills, nausea, vomiting, or diarrhea. 05-09-2023 upon evaluation today patient appears to be doing well currently in regard to her wound is not worse but is also not better. I discussed with her again today that we will get a need to likely  compression wrap for her. With that being said she is adamantly opposed to this and wants to get 1 more week given something to try and I recommend to be seen by double layer Tubigrip to see if this can be beneficial. 05-17-2023 upon evaluation today patient appears to be doing well currently in regard to her wound although it still very inflamed on the posterior aspect of her ankle. I discussed that with her today. With that being said I do not see any evidence of general worsening which is great news but also do not see a significant amount of improvement compared to last week in my opinion. I discussed this with her at this point today. 05-23-2023 upon evaluation today patient appears to be doing well currently in regard to her wound. This is actually showing some slough and biofilm on the surface of the wound. The good news is I do believe that we cleaned some of the soften the scar ischial little bit of improvement with that being said the periwound is being very irritated I think the Vashe Christine have irritated her based on what she is telling me. Fortunately I do not see any evidence of active infection locally nor systemically at this time which is great news. No fevers, chills, nausea, vomiting, or diarrhea. 7/22; wound on the left lateral lower leg in the setting of significant chronic venous hypertension. We have been using Prisma. She would not allow a [name], [name] ([id number]) [id number] Page 6 of 8 compression wrap although she is using a support stocking. 06-06-2023 upon evaluation today patient's wound actually showing signs of significant improvement with regard to her wound. She has been tolerating the dressing changes without complication. Fortunately there does not appear to be any signs of active infection locally nor systemically at this time which is great news. No fevers, chills, nausea, vomiting, or diarrhea. Objective Constitutional Well-nourished  and well-hydrated in no acute distress. Vitals Time Taken: 8:58 AM, Height: 68 in, Weight: 200 lbs, BMI: 30.4, Temperature: 97.9 F, Pulse: 79 bpm, Respiratory Rate: 18 breaths/min, Blood Pressure: 164/71 mmHg. Respiratory normal breathing without difficulty. Psychiatric this patient is able to make decisions and demonstrates good insight into disease process. Alert and Oriented x 3. pleasant and cooperative. General Notes: Upon inspection patient's wound actually showing signs of significant improvement I am actually very pleased with where  we stand today I do believe that she is making good headway towards complete closure. Overall I am extremely happy with where we stand today. Integumentary (Hair, Skin) Wound #1 status is Open. Original cause of wound was Trauma. The date acquired was: 02/07/2023. The wound has been in treatment 6 weeks. The wound is located on the Left,Distal,Lateral Lower Leg. The wound measures 0.5cm length x 0.7cm width x 0.2cm depth; 0.275cm^2 area and 0.055cm^3 volume. There is Fat Layer (Subcutaneous Tissue) exposed. There is no tunneling or undermining noted. There is a medium amount of serosanguineous drainage noted. There is medium (34-66%) red granulation within the wound bed. There is a medium (34-66%) amount of necrotic tissue within the wound bed including Adherent Slough. Assessment Active Problems ICD-10 Chronic venous hypertension (idiopathic) with ulcer and inflammation of left lower extremity Type 2 diabetes mellitus with other skin ulcer Lymphedema, not elsewhere classified Non-pressure chronic ulcer of other part of left lower leg with fat layer exposed Essential (primary) hypertension Procedures Wound #1 Pre-procedure diagnosis of Wound #1 is a Diabetic Wound/Ulcer of the Lower Extremity located on the Left,Distal,Lateral Lower Leg .Severity of Tissue Pre Debridement is: Fat layer exposed. There was a Excisional Skin/Subcutaneous Tissue Debridement  with a total area of 0.27 sq cm performed by Allen Derry, PA-C. With the following instrument(s): Curette to remove Viable and Non-Viable tissue/material. Material removed includes Subcutaneous Tissue and Slough and. No specimens were taken. A time out was conducted at 09:32, prior to the start of the procedure. A Minimum amount of bleeding was controlled with Pressure. The procedure was tolerated well with a pain level of 0 throughout and a pain level of 0 following the procedure. Post Debridement Measurements: 0.5cm length x 0.7cm width x 0.2cm depth; 0.055cm^3 volume. Character of Wound/Ulcer Post Debridement is improved. Severity of Tissue Post Debridement is: Fat layer exposed. Post procedure Diagnosis Wound #1: Same as Pre-Procedure Plan Follow-up Appointments: Return Appointment in 1 week. Bathing/ Shower/ Hygiene: Christine shower; gently cleanse wound with antibacterial soap, rinse and pat dry prior to dressing wounds Edema Control - Lymphedema / Segmental Compressive Device / Other: Patient to wear own compression stockings. Remove compression stockings every night before going to bed and put on every morning when getting up. - bi lat Elevate, Exercise Daily and Avoid Standing for Long Periods of Time. Elevate legs to the level of the heart and pump ankles as often as possible Elevate leg(s) parallel to the floor when sitting. WOUND #1: - Lower Leg Wound Laterality: Left, Lateral, Distal [name], [name] ([id number]) [id number] Page 7 of 8 Cleanser: Byram Ancillary Kit - 15 Day Supply (Generic) 3 x Per Week/30 Days Discharge Instructions: Use supplies as instructed; Kit contains: (15) Saline Bullets; (15) 3x3 Gauze; 15 pr Gloves Prim Dressing: Prisma 4.34 (in) 3 x Per Week/30 Days ary Discharge Instructions: Moisten w/normal saline or sterile water; Cover wound as directed. Do not remove from wound bed. Secondary Dressing: ABD Pad 5x9 (in/in) 3 x Per Week/30  Days Discharge Instructions: Cover with ABD pad Secondary Dressing: Kerlix 4.5 x 4.1 (in/yd) 3 x Per Week/30 Days Discharge Instructions: Apply Kerlix 4.5 x 4.1 (in/yd) as instructed Secured With: Medipore T - 80M Medipore H Soft Cloth Surgical T ape ape, 2x2 (in/yd) 3 x Per Week/30 Days 1. I am going to recommend that we have the patient continue with the recommendation for silver collagen to be used. I think this is doing a good job. 2 I did remove some of the  slough and biofilm from the surface of the wound. With that being said I do believe that she is making some headway here towards closure. 3. I am going see how things appear come next week. If she is not seeing continued and ongoing improvement I think the neck step Christine be for [place] to actually get her in for vascular evaluation to confirm her ABI just due to the reading that we got initially on screening here in the clinic is 0.75. We will see patient back for reevaluation in 1 week here in the clinic. If anything worsens or changes patient will contact our office for additional recommendations. Electronic Signature(s) Signed: 06/06/2023 9:42:33 AM By: Allen Derry PA-C Entered By: Allen Derry on 06/06/2023 09:42:33 -------------------------------------------------------------------------------- SuperBill Details Patient Name: Date of Service: [name], Nestor Ramp 06/06/2023 Medical Record Number: [id number] Patient Account Number: [id number] Date of Birth/Sex: Treating RN: [name][birth year] (77 y.o. [name] Primary [name] Provider: [name] Other Clinician: Referring Provider: Treating Provider/Extender: [name] in Treatment: 6 Diagnosis Coding ICD-10 Codes Code Description [phone] Chronic venous hypertension (idiopathic) with ulcer and inflammation of left lower extremity E11.622 Type 2 diabetes mellitus with other skin ulcer I89.0 Lymphedema, not elsewhere classified L97.822 Non-pressure  chronic ulcer of other part of left lower leg with fat layer exposed I10 Essential (primary) hypertension Facility Procedures : CPT4 Code: [id number] Description: 11042 - DEB SUBQ TISSUE 20 SQ CM/< ICD-10 Diagnosis Description L97.822 Non-pressure chronic ulcer of other part of left lower leg with fat layer expos Modifier: ed Quantity: 1 Physician Procedures : CPT4 Code Description Modifier [id number] 11042 - WC PHYS SUBQ TISS 20 SQ CM ICD-10 Diagnosis Description L97.822 Non-pressure chronic ulcer of other part of left lower leg with fat layer exposed Quantity: 1 Electronic Signature(s) LOKELANI, FASICK ([id number]) [id number] Page 8 of 8 Signed: 06/06/2023 9:42:41 AM By: Allen Derry PA-C Entered By: Allen Derry on 06/06/2023 09:42:40

## 2023-06-08 NOTE — Progress Notes (Signed)
[name], [name] ([id number]) [id number] Page 1 of 9 Visit Report for 06/06/2023 Arrival Information Details Patient Name: Date of Service: [name], [name] 06/06/2023 8:45 A M Medical Record Number: [id number] Patient Account Number: [id number] Date of Birth/Sex: Treating RN: [birth year][name] (77 y.o. [name] Primary [name] [name]: [name] Other Clinician: Referring [name]: Treating [name]/Extender: [name] in Treatment: 6 Visit Information History Since Last Visit Added or deleted any medications: No Patient Arrived: Ambulatory Any new allergies or adverse reactions: No Arrival Time: 08:58 Had a fall or experienced change in No Accompanied By: self activities of daily living that may affect Transfer Assistance: None risk of falls: Patient Identification Verified: Yes Signs or symptoms of abuse/neglect since last visito No Secondary Verification Process Completed: Yes Hospitalized since last visit: No Patient Requires Transmission-Based Precautions: No Implantable device outside of the clinic excluding No Patient Has Alerts: No cellular tissue based products placed in the center since last visit: Has Dressing in Place as Prescribed: Yes Pain Present Now: No Electronic Signature(s) Signed: 06/08/2023 4:21:14 PM By: [name] Entered By: Yevonne Pax on 06/06/2023 08:58:32 -------------------------------------------------------------------------------- Clinic Level of [name] Assessment Details Patient Name: Date of Service: [name], [name] 06/06/2023 8:45 A M Medical Record Number: [id number] Patient Account Number: [id number] Date of Birth/Sex: Treating RN: [name] (77 y.o. [name] Primary [name] [name]: [name] Other Clinician: Referring [name]: Treating [name]/Extender: [name] in Treatment: 6 Clinic Level of [name] Assessment Items TOOL 1  Quantity Score []  - 0 Use when EandM and Procedure is performed on INITIAL visit ASSESSMENTS - Nursing Assessment / Reassessment []  - 0 General Physical Exam (combine w/ comprehensive assessment (listed just below) when performed on new pt. evals) []  - 0 Comprehensive Assessment (HX, ROS, Risk Assessments, Wounds Hx, etc.) [name] ([id number]) [id number] Page 2 of 9 ASSESSMENTS - Wound and Skin Assessment / Reassessment []  - 0 Dermatologic / Skin Assessment (not related to wound area) ASSESSMENTS - Ostomy and/or Continence Assessment and [name] []  - 0 Incontinence Assessment and Management []  - 0 Ostomy [name] Assessment and Management (repouching, etc.) PROCESS - Coordination of [name] []  - 0 Simple Patient / Family Education for ongoing [name] []  - 0 Complex (extensive) Patient / Family Education for ongoing [name] []  - 0 Staff obtains Chiropractor, Records, T Results / Process Orders est []  - 0 Staff telephones HHA, Nursing Homes / Clarify orders / etc []  - 0 Routine Transfer to another Facility (non-emergent condition) []  - 0 Routine Hospital Admission (non-emergent condition) []  - 0 New Admissions / Manufacturing engineer / Ordering NPWT [name], etc. , []  - 0 Emergency Hospital Admission (emergent condition) PROCESS - Special Needs []  - 0 Pediatric / Minor Patient Management []  - 0 Isolation Patient Management []  - 0 Hearing / Language / Visual special needs []  - 0 Assessment of Community assistance (transportation, D/C planning, etc.) []  - 0 Additional assistance / Altered mentation []  - 0 Support Surface(s) Assessment (bed, cushion, seat, etc.) INTERVENTIONS - Miscellaneous []  - 0 External ear exam []  - 0 Patient Transfer (multiple staff / Nurse, adult / Similar devices) []  - 0 Simple Staple / Suture removal (25 or less) []  - 0 Complex Staple / Suture removal (26 or more) []  - 0 Hypo/Hyperglycemic Management (do not check if  billed separately) []  - 0 Ankle / Brachial Index (ABI) - do not check if billed separately Has the patient been seen at the hospital within the  last three years: Yes Total Score: 0 Level Of [name]: ____ Electronic Signature(s) Signed: 06/08/2023 4:21:14 PM By: [name] Entered By: Yevonne Pax on 06/06/2023 09:35:27 -------------------------------------------------------------------------------- Encounter Discharge Information Details Patient Name: Date of Service: [name], [name] J. 06/06/2023 8:45 A M Medical Record Number: [id number] Patient Account Number: [id number] Date of Birth/Sex: Treating RN: [birth year][name] (77 y.o. [name] Primary [name] [name]: [name] Other Clinician: Referring [name]: Treating [name]/Extender: [name] in Treatment: [address] [name] ([id number]) [id number] Page 3 of 9 Encounter Discharge Information Items Post Procedure Vitals Discharge Condition: Stable Temperature (F): 97.9 Ambulatory Status: Ambulatory Pulse (bpm): 79 Discharge Destination: Home Respiratory Rate (breaths/min): 18 Transportation: Private Auto Blood Pressure (mmHg): 164/71 Accompanied By: self Schedule Follow-up Appointment: Yes Clinical Summary of [name]: Electronic Signature(s) Signed: 06/08/2023 4:21:14 PM By: [name] Entered By: Yevonne Pax on 06/06/2023 09:36:12 -------------------------------------------------------------------------------- Lower Extremity Assessment Details Patient Name: Date of Service: [name], [name] 06/06/2023 8:45 A M Medical Record Number: [id number] Patient Account Number: [id number] Date of Birth/Sex: Treating RN: [birth year] (77 y.o. [name] Primary [name] [name]: [name] Other Clinician: Referring [name]: Treating [name]/Extender: [name] in Treatment: 6 Edema Assessment Assessed: [Left: No] [Right:  No] Edema: [Left: N] [Right: o] Calf Left: Right: Point of Measurement: 35 cm From Medial Instep 37 cm Ankle Left: Right: Point of Measurement: 10 cm From Medial Instep 22 cm Vascular Assessment Pulses: Dorsalis Pedis Palpable: [Left:Yes] Extremity colors, hair growth, and conditions: Extremity Color: [Left:Hyperpigmented] Hair Growth on Extremity: [Left:No] Temperature of Extremity: [Left:Warm] Capillary Refill: [Left:< 3 seconds] Dependent Rubor: [Left:Yes] Blanched when Elevated: [Left:No Yes] Toe Nail Assessment Left: Right: Thick: No Discolored: No Deformed: No Improper Length and Hygiene: No Electronic Signature(s) [name], [name] ([id number]) [id number] Page 4 of 9 Signed: 06/08/2023 4:21:14 PM By: [name] Previous Signature: 06/06/2023 9:13:35 AM Version By: [name] Entered By: Yevonne Pax on 06/06/2023 09:31:12 -------------------------------------------------------------------------------- Multi Wound Chart Details Patient Name: Date of Service: [name], [name] J. 06/06/2023 8:45 A M Medical Record Number: [id number] Patient Account Number: [id number] Date of Birth/Sex: Treating RN: [birth year][name] (77 y.o. [name] Primary [name] Wladyslaw Henrichs: [name] Other Clinician: Referring Nakiea Metzner: Treating Alyne Martinson/Extender: [name] in Treatment: 6 Vital Signs Height(in): 68 Pulse(bpm): 79 Weight(lbs): 200 Blood Pressure(mmHg): 164/71 Body Mass Index(BMI): 30.4 Temperature(F): 97.9 Respiratory Rate(breaths/min): 18 [1:Photos:] [N/A:N/A] Left, Distal, Lateral Lower Leg N/A N/A Wound Location: Trauma N/A N/A Wounding Event: Diabetic Wound/Ulcer of the Lower N/A N/A Primary Etiology: Extremity Lymphedema, Hypertension, Type II N/A N/A Comorbid History: Diabetes 02/07/2023 N/A N/A Date Acquired: 6 N/A N/A Weeks of Treatment: Open N/A N/A Wound Status: No N/A N/A Wound  Recurrence: 0.5x0.7x0.2 N/A N/A Measurements L x W x D (cm) 0.275 N/A N/A A (cm) : rea 0.055 N/A N/A Volume (cm) : 22.10% N/A N/A % Reduction in A rea: 48.10% N/A N/A % Reduction in Volume: Grade 1 N/A N/A Classification: Medium N/A N/A Exudate A mount: Serosanguineous N/A N/A Exudate Type: red, brown N/A N/A Exudate Color: Medium (34-66%) N/A N/A Granulation A mount: Red N/A N/A Granulation Quality: Medium (34-66%) N/A N/A Necrotic A mount: Fat Layer (Subcutaneous Tissue): Yes N/A N/A Exposed Structures: Fascia: No Tendon: No Muscle: No Joint: No Bone: No None N/A N/A Epithelialization: Treatment Notes [name], [name] ([id number]) [id number] Page 5 of 9 Electronic Signature(s) Signed: 06/08/2023 4:21:14 PM By: [name] Entered By: Yevonne Pax on  06/06/2023 09:00:28 -------------------------------------------------------------------------------- Multi-Disciplinary [name] Plan Details Patient Name: Date of Service: LESLEA, IVERSEN 06/06/2023 8:45 A M Medical Record Number: [id number] Patient Account Number: [id number] Date of Birth/Sex: Treating RN: [birth year][name] (77 y.o. [name] Primary [name] Aleksia Freiman: [name] Other Clinician: Referring Glorene Leitzke: Treating Pearley Baranek/Extender: [name] in Treatment: 6 Active Inactive Necrotic Tissue Nursing Diagnoses: Knowledge deficit related to management of necrotic/devitalized tissue Goals: Patient/caregiver will verbalize understanding of reason and process for debridement of necrotic tissue Date Initiated: 04/22/2023 Target Resolution Date: 05/22/2023 Goal Status: Active Interventions: Assess patient pain level pre-, during and post procedure and prior to discharge Notes: Wound/Skin Impairment Nursing Diagnoses: Knowledge deficit related to ulceration/compromised skin integrity Goals: Patient/caregiver will verbalize understanding of skin  [name] regimen Date Initiated: 04/22/2023 Target Resolution Date: 05/22/2023 Goal Status: Active Ulcer/skin breakdown will have a volume reduction of 30% by week 4 Date Initiated: 04/22/2023 Target Resolution Date: 05/22/2023 Goal Status: Active Ulcer/skin breakdown will have a volume reduction of 50% by week 8 Date Initiated: 04/22/2023 Target Resolution Date: 06/22/2023 Goal Status: Active Ulcer/skin breakdown will have a volume reduction of 80% by week 12 Date Initiated: 04/22/2023 Target Resolution Date: 07/23/2023 Goal Status: Active Ulcer/skin breakdown will heal within 14 weeks Date Initiated: 04/22/2023 Target Resolution Date: 08/22/2023 Goal Status: Active Interventions: Assess patient/caregiver ability to obtain necessary supplies Assess patient/caregiver ability to perform ulcer/skin [name] regimen upon admission and as needed Assess ulceration(s) every visit Notes: Electronic Signature(s) [name], [name] ([id number]) [id number] Page 6 of 9 Signed: 06/08/2023 4:21:14 PM By: [name] Entered By: Yevonne Pax on 06/06/2023 09:00:41 -------------------------------------------------------------------------------- Pain Assessment Details Patient Name: Date of Service: [name], [name] 06/06/2023 8:45 A M Medical Record Number: [id number] Patient Account Number: [id number] Date of Birth/Sex: Treating RN: [birth year][name] (77 y.o. [name] Primary [name] Taden Witter: [name] Other Clinician: Referring Isiah Scheel: Treating Lindzie Boxx/Extender: [name] in Treatment: 6 Active Problems Location of Pain Severity and Description of Pain Patient Has Paino No Site Locations Pain Management and Medication Current Pain Management: Electronic Signature(s) Signed: 06/08/2023 4:21:14 PM By: [name] Entered By: Yevonne Pax on 06/06/2023  08:59:19 -------------------------------------------------------------------------------- Patient/Caregiver Education Details Patient Name: Date of Service: [name] 7/29/2024andnbsp8:45 A M Medical Record Number: [id number] Patient Account Number: [id number] [name], [name] ([id number]) [id number] Page 7 of 9 Date of Birth/Gender: Treating RN: [birth year][name] (77 y.o. [name] Primary [name] Physician: [name] Other Clinician: Referring Physician: Treating Physician/Extender: [name] in Treatment: 6 Education Assessment Education Provided To: Patient Education Topics Provided Wound/Skin Impairment: Handouts: Caring for Your Ulcer Methods: Explain/Verbal Responses: State content correctly Electronic Signature(s) Signed: 06/08/2023 4:21:14 PM By: [name] Entered By: Yevonne Pax on 06/06/2023 09:00:56 -------------------------------------------------------------------------------- Wound Assessment Details Patient Name: Date of Service: [name], [name] 06/06/2023 8:45 A M Medical Record Number: [id number] Patient Account Number: [id number] Date of Birth/Sex: Treating RN: [birth year][name] (77 y.o. [name] Primary [name] Emannuel Vise: [name] Other Clinician: Referring Selig Wampole: Treating Hildagarde Holleran/Extender: [name] in Treatment: 6 Wound Status Wound Number: 1 Primary Etiology: Diabetic Wound/Ulcer of the Lower Extremity Wound Location: Left, Distal, Lateral Lower Leg Wound Status: Open Wounding Event: Trauma Comorbid History: Lymphedema, Hypertension, Type II Diabetes Date Acquired: 02/07/2023 Weeks Of Treatment: 6 Clustered Wound: No Photos Wound Measurements Length: (cm) 0 Width: (cm) 0 Depth: (cm) 0 Area: (cm) Volume: (cm) [name], Marleny J ([id number]) Wound Description Classification: Grade 1 Exudate Amount:  Medium Exudate Type:  Serosanguineous Exudate Color: red, brown Foul Odor After Cleansing: Slough/Fibrino .5 % Reduction in Area: 22.1% .7 % Reduction in Volume: 48.1% .2 Epithelialization: None 0.275 Tunneling: No 0.055 Undermining: No [id number] Page 8 of 9 No Yes Wound Bed Granulation Amount: Medium (34-66%) Exposed Structure Granulation Quality: Red Fascia Exposed: No Necrotic Amount: Medium (34-66%) Fat Layer (Subcutaneous Tissue) Exposed: Yes Necrotic Quality: Adherent Slough Tendon Exposed: No Muscle Exposed: No Joint Exposed: No Bone Exposed: No Treatment Notes Wound #1 (Lower Leg) Wound Laterality: Left, Lateral, Distal Cleanser [place]ram Ancillary Kit - 15 Day Supply Discharge Instruction: Use supplies as instructed; Kit contains: (15) Saline Bullets; (15) 3x3 Gauze; 15 pr Gloves Peri-Wound [name] Topical Primary Dressing Prisma 4.34 (in) Discharge Instruction: Moisten w/normal saline or sterile water; Cover wound as directed. Do not remove from wound bed. Secondary Dressing ABD Pad 5x9 (in/in) Discharge Instruction: Cover with ABD pad Kerlix 4.5 x 4.1 (in/yd) Discharge Instruction: Apply Kerlix 4.5 x 4.1 (in/yd) as instructed Secured With Medipore T - 62M Medipore H Soft Cloth Surgical T ape ape, 2x2 (in/yd) Compression Wrap Compression Stockings Add-Ons Electronic Signature(s) Signed: 06/08/2023 4:21:14 PM By: [name] Entered By: Yevonne Pax on 06/06/2023 08:59:57 -------------------------------------------------------------------------------- Vitals Details Patient Name: Date of Service: [name], [name] J. 06/06/2023 8:45 A M Medical Record Number: [id number] Patient Account Number: [id number] Date of Birth/Sex: Treating RN: [birth year][name] (77 y.o. [name] Primary [name] Kostantinos Tallman: [name] Other Clinician: Referring Sunny Gains: Treating Aariah Godette/Extender: [name] in Treatment: 6 Vital Signs Time Taken:  08:58 Temperature (F): 97.9 KATHE, CAMPA J ([id number]) [id number] Page 9 of 9 Height (in): 68 Pulse (bpm): 79 Weight (lbs): 200 Respiratory Rate (breaths/min): 18 Body Mass Index (BMI): 30.4 Blood Pressure (mmHg): 164/71 Reference Range: 80 - 120 mg / dl Electronic Signature(s) Signed: 06/08/2023 4:21:14 PM By: [name] Entered By: Yevonne Pax on 06/06/2023 08:59:08

## 2023-06-13 ENCOUNTER — Encounter: Payer: Federal, State, Local not specified - PPO | Admitting: Physician Assistant

## 2023-06-20 ENCOUNTER — Encounter: Payer: Federal, State, Local not specified - PPO | Attending: Physician Assistant | Admitting: Physician Assistant

## 2023-06-20 DIAGNOSIS — L97822 Non-pressure chronic ulcer of other part of left lower leg with fat layer exposed: Secondary | ICD-10-CM | POA: Diagnosis not present

## 2023-06-20 DIAGNOSIS — I890 Lymphedema, not elsewhere classified: Secondary | ICD-10-CM | POA: Insufficient documentation

## 2023-06-20 DIAGNOSIS — I10 Essential (primary) hypertension: Secondary | ICD-10-CM | POA: Diagnosis not present

## 2023-06-20 DIAGNOSIS — E11622 Type 2 diabetes mellitus with other skin ulcer: Secondary | ICD-10-CM | POA: Insufficient documentation

## 2023-06-20 DIAGNOSIS — I87332 Chronic venous hypertension (idiopathic) with ulcer and inflammation of left lower extremity: Secondary | ICD-10-CM | POA: Diagnosis present

## 2023-06-20 NOTE — Progress Notes (Signed)
[name], [name] ([id number]) [id number] Page 1 of 8 Visit Report for 06/20/2023 Chief Complaint Document Details Patient Name: Date of Service: [name], [name] 06/20/2023 8:45 A M Medical Record Number: [id number] Patient Account Number: [id number] Date of Birth/Sex: Treating RN: Mar 28, 1946 (77 y.o. [name] Primary Care Provider: [name] Other Clinician: Referring Provider: Treating Provider/Extender: [name] in Treatment: 8 Information Obtained from: Patient Chief Complaint Left LE Ulcer Electronic Signature(s) Signed: 06/20/2023 8:43:36 AM By: Allen Derry PA-C Entered By: Allen Derry on 06/20/2023 08:43:36 -------------------------------------------------------------------------------- Debridement Details Patient Name: Date of Service: Christine Saxon J. 06/20/2023 8:45 A M Medical Record Number: [id number] Patient Account Number: [id number] Date of Birth/Sex: Treating RN: [birth year] (77 y.o. [name] Primary Care Provider: [name] Other Clinician: Referring Provider: Treating Provider/Extender: [name] in Treatment: 8 Debridement Performed for Assessment: Wound #1 Left,Distal,Lateral Lower Leg Performed By: Physician Allen Derry, PA-C Debridement Type: Debridement Severity of Tissue Pre Debridement: Fat layer exposed Level of Consciousness (Pre-procedure): Awake and Alert Pre-procedure Verification/Time Out Yes - 08:53 Taken: Start Time: 08:53 Percent of Wound Bed Debrided: 100% T Area Debrided (cm): otal 0.27 Tissue and other material debrided: Slough, Subcutaneous, Slough Level: Skin/Subcutaneous Tissue Debridement Description: Excisional Instrument: Curette Bleeding: Minimum Hemostasis Achieved: Pressure End Time: 08:55 Procedural Pain: 0 Post Procedural Pain: 0 KAYTI, VACCARELLO ([id number]) [id number] Page 2 of  8 Response to Treatment: Procedure was tolerated well Level of Consciousness (Post- Awake and Alert procedure): Post Debridement Measurements of Total Wound Length: (cm) 0.5 Width: (cm) 0.7 Depth: (cm) 0.2 Volume: (cm) 0.055 Character of Wound/Ulcer Post Debridement: Improved Severity of Tissue Post Debridement: Fat layer exposed Post Procedure Diagnosis Same as Pre-procedure Electronic Signature(s) Unsigned Entered ByYevonne Pax on 06/20/2023 08:57:10 -------------------------------------------------------------------------------- HPI Details Patient Name: Date of Service: Christine, [name] 06/20/2023 8:45 A M Medical Record Number: [id number] Patient Account Number: [id number] Date of Birth/Sex: Treating RN: 06/22/1946 (77 y.o. [name] Primary Care Provider: [name] Other Clinician: Referring Provider: Treating Provider/Extender: [name] in Treatment: 8 History of Present Illness HPI Description: 04-22-2023 upon evaluation patient appears to be doing somewhat poorly in regard to a wound on the left distal/lateral lower extremity and about the ankle region. She tells me this occurred on February 07, 2023 as a result of trying to get a very tight and new compression sock off. It was getting stuck and she somewhat twisted trying to get it off and pulled skin off. Since that time she has been having a hard time getting this to heal. Fortunately there does not appear to be any signs of active infection which is great news. She did have an ABI of 0.75 today. With that being said I am actually decently pleased with how the wound looks it does have some necrotic tissue but there is going to need to be some sharp debridement to clear away some of the dead tissue as well today. Patient does have a history of diabetes mellitus type 2, lymphedema, hypertension, and chronic venous insufficiency. 05-02-2023 upon evaluation today patient appears to be  doing well currently in regard to her wound. She is actually showing signs of improvement and very pleased in that regard and fortunately I do not see any signs of active infection locally or systemically at this time which is great news. No fevers, chills, nausea, vomiting, or diarrhea. 05-09-2023 upon evaluation today patient appears to be doing well currently in  regard to her wound is not worse but is also not better. I discussed with her again today that we will get a need to likely compression wrap for her. With that being said she is adamantly opposed to this and wants to get 1 more week given something to try and I recommend to be seen by double layer Tubigrip to see if this can be beneficial. 05-17-2023 upon evaluation today patient appears to be doing well currently in regard to her wound although it still very inflamed on the posterior aspect of her ankle. I discussed that with her today. With that being said I do not see any evidence of general worsening which is great news but also do not see a significant amount of improvement compared to last week in my opinion. I discussed this with her at this point today. 05-23-2023 upon evaluation today patient appears to be doing well currently in regard to her wound. This is actually showing some slough and biofilm on the surface of the wound. The good news is I do believe that we cleaned some of the soften the scar ischial little bit of improvement with that being said the periwound is being very irritated I think the Vashe may have irritated her based on what she is telling me. Fortunately I do not see any evidence of active infection locally nor systemically at this time which is great news. No fevers, chills, nausea, vomiting, or diarrhea. 7/22; wound on the left lateral lower leg in the setting of significant chronic venous hypertension. We have been using Prisma. She would not allow a compression wrap although she is using a support  stocking. 06-06-2023 upon evaluation today patient's wound actually showing signs of significant improvement with regard to her wound. She has been tolerating the dressing changes without complication. Fortunately there does not appear to be any signs of active infection locally nor systemically at this time which is great news. No fevers, chills, nausea, vomiting, or diarrhea. LAUNIE, SQUILLACE ([id number]) [id number] Page 3 of 8 06-20-2023 upon evaluation today patient appears to be doing well currently in regard to her wound which is actually showing signs of significant improvement. Fortunately I do not see any evidence of worsening infection I do believe that the patient is moving in the right direction which is great news and in general I think that we are on the right track here. Electronic Signature(s) Signed: 06/20/2023 8:59:39 AM By: Allen Derry PA-C Entered By: Allen Derry on 06/20/2023 08:59:39 -------------------------------------------------------------------------------- Physical Exam Details Patient Name: Date of Service: DEZLYNN, KUHNKE 06/20/2023 8:45 A M Medical Record Number: [id number] Patient Account Number: [id number] Date of Birth/Sex: Treating RN: [birth year] (77 y.o. [name] Primary Care Provider: [name] Other Clinician: Referring Provider: Treating Provider/Extender: [name] in Treatment: 8 Constitutional Well-nourished and well-hydrated in no acute distress. Respiratory normal breathing without difficulty. Psychiatric this patient is able to make decisions and demonstrates good insight into disease process. Alert and Oriented x 3. pleasant and cooperative. Notes Patient's wound bed did require sharp debridement clearway some necrotic debris she tolerated that today without complication and postdebridement wound bed appears to be doing much better which is great news. Electronic  Signature(s) Signed: 06/20/2023 8:59:57 AM By: Allen Derry PA-C Entered By: Allen Derry on 06/20/2023 08:59:57 -------------------------------------------------------------------------------- Physician Orders Details Patient Name: Date of Service: Christine Saxon J. 06/20/2023 8:45 A M Medical Record Number: [id number] Patient Account Number: [id number] Date of Birth/Sex: Treating RN: Sep 07, 1946 (  77 y.o. [name] Primary Care Provider: [name] Other Clinician: Referring Provider: Treating Provider/Extender: [name] in Treatment: 8 Verbal / Phone Orders: No Diagnosis [address] PATCHES, MONTECINO ([id number]) [id number] Page 4 of 8 ICD-10 Coding Code Description [phone] Chronic venous hypertension (idiopathic) with ulcer and inflammation of left lower extremity E11.622 Type 2 diabetes mellitus with other skin ulcer I89.0 Lymphedema, not elsewhere classified L97.822 Non-pressure chronic ulcer of other part of left lower leg with fat layer exposed I10 Essential (primary) hypertension Follow-up Appointments Return Appointment in 1 week. Bathing/ Shower/ Hygiene May shower; gently cleanse wound with antibacterial soap, rinse and pat dry prior to dressing wounds Edema Control - Lymphedema / Segmental Compressive Device / Other Patient to wear own compression stockings. Remove compression stockings every night before going to bed and put on every morning when getting up. - bi lat Elevate, Exercise Daily and A void Standing for Long Periods of Time. Elevate legs to the level of the heart and pump ankles as often as possible Elevate leg(s) parallel to the floor when sitting. Wound Treatment Wound #1 - Lower Leg Wound Laterality: Left, Lateral, Distal Cleanser: Byram Ancillary Kit - 15 Day Supply (Generic) 3 x Per Week/30 Days Discharge Instructions: Use supplies as instructed; Kit contains: (15) Saline Bullets; (15) 3x3  Gauze; 15 pr Gloves Prim Dressing: Prisma 4.34 (in) 3 x Per Week/30 Days ary Discharge Instructions: Moisten w/normal saline or sterile water; Cover wound as directed. Do not remove from wound bed. Secondary Dressing: ABD Pad 5x9 (in/in) 3 x Per Week/30 Days Discharge Instructions: Cover with ABD pad Secondary Dressing: Kerlix 4.5 x 4.1 (in/yd) 3 x Per Week/30 Days Discharge Instructions: Apply Kerlix 4.5 x 4.1 (in/yd) as instructed Secured With: Medipore T - 46M Medipore H Soft Cloth Surgical T ape ape, 2x2 (in/yd) 3 x Per Week/30 Days Electronic Signature(s) Unsigned Entered ByYevonne Pax on 06/20/2023 08:55:07 -------------------------------------------------------------------------------- Problem List Details Patient Name: Date of Service: VERENISSE, OCONNOR 06/20/2023 8:45 A M Medical Record Number: [id number] Patient Account Number: [id number] Date of Birth/Sex: Treating RN: Jan 20, 1946 (77 y.o. 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 06/20/2023 8:45 A M Medical Record Number: [id number] Patient Account Number: [id number] Date of Birth/Sex: Treating RN: [birth year] (77 y.o. [name] Primary Care Provider: [name] Other Clinician: Referring Provider: Treating Provider/Extender: [name] in Treatment: 8 Subjective Chief Complaint Information obtained from Patient Left LE Ulcer History of Present Illness (HPI) 04-22-2023 upon evaluation patient appears to be doing somewhat poorly in regard to a wound on the left distal/lateral lower extremity and about the ankle region. She tells me this occurred on February 07, 2023 as a result of trying to get a very  tight and new compression sock off. It was getting stuck and she somewhat twisted trying to get it off and pulled skin off. Since that time she has been having a hard time getting this to heal. Fortunately there does not appear to be any signs of active infection which is great news. She did have an ABI of 0.75 today. With that being said I am actually decently pleased with how the wound looks it does have some necrotic tissue but there is going to need to be some sharp debridement to clear away some of the dead tissue as well today. Patient does have a history of diabetes mellitus type 2, lymphedema, hypertension, and chronic venous insufficiency. 05-02-2023 upon evaluation today patient appears to be doing well currently in regard to her wound. She is actually showing signs of improvement and very pleased in that regard and fortunately I do not see any signs of active infection locally or systemically at this time which is great news. No fevers, chills, nausea, vomiting, or diarrhea. 05-09-2023 upon evaluation today patient appears to be doing well currently in regard to her wound is not worse but is also not better. I discussed with her again today that we will get a need to  likely compression wrap for her. With that being said she is adamantly opposed to this and wants to get 1 more week given something to try and I recommend to be seen by double layer Tubigrip to see if this can be beneficial. 05-17-2023 upon evaluation today patient appears to be doing well currently in regard to her wound although it still very inflamed on the posterior aspect of her ankle. I discussed that with her today. With that being said I do not see any evidence of general worsening which is great news but also do not see a significant amount of improvement compared to last week in my opinion. I discussed this with her at this point today. NETHRA, FRANCIONE ([id number]) [id number] Page 6 of 8 05-23-2023 upon evaluation today patient appears to be doing well currently in regard to her wound. This is actually showing some slough and biofilm on the surface of the wound. The good news is I do believe that we cleaned some of the soften the scar ischial little bit of improvement with that being said the periwound is being very irritated I think the Vashe may have irritated her based on what she is telling me. Fortunately I do not see any evidence of active infection locally nor systemically at this time which is great news. No fevers, chills, nausea, vomiting, or diarrhea. 7/22; wound on the left lateral lower leg in the setting of significant chronic venous hypertension. We have been using Prisma. She would not allow a compression wrap although she is using a support stocking. 06-06-2023 upon evaluation today patient's wound actually showing signs of significant improvement with regard to her wound. She has been tolerating the dressing changes without complication. Fortunately there does not appear to be any signs of active infection locally nor systemically at this time which is great news. No fevers, chills, nausea, vomiting, or diarrhea. 06-20-2023 upon evaluation today  patient appears to be doing well currently in regard to her wound which is actually showing signs of significant improvement. Fortunately I do not see any evidence of worsening infection I do believe that the patient is moving in the right direction which is great news and in general I think that we are on the right track here.  Objective Constitutional Well-nourished and well-hydrated in no acute distress. Vitals Time Taken: 8:39 AM, Height: 68 in, Weight: 200 lbs, BMI: 30.4, Temperature: 97.9 F, Pulse: 63 bpm, Respiratory Rate: 18 breaths/min, Blood Pressure: 153/57 mmHg. Respiratory normal breathing without difficulty. Psychiatric this patient is able to make decisions and demonstrates good insight into disease process. Alert and Oriented x 3. pleasant and cooperative. General Notes: Patient's wound bed did require sharp debridement clearway some necrotic debris she tolerated that today without complication and postdebridement wound bed appears to be doing much better which is great news. Integumentary (Hair, Skin) Wound #1 status is Open. Original cause of wound was Trauma. The date acquired was: 02/07/2023. The wound has been in treatment 8 weeks. The wound is located on the Left,Distal,Lateral Lower Leg. The wound measures 0.5cm length x 0.7cm width x 0.2cm depth; 0.275cm^2 area and 0.055cm^3 volume. There is Fat Layer (Subcutaneous Tissue) exposed. There is no tunneling or undermining noted. There is a medium amount of serosanguineous drainage noted. There is small (1-33%) red granulation within the wound bed. There is a large (67-100%) amount of necrotic tissue within the wound bed including Adherent Slough. Assessment Active Problems ICD-10 Chronic venous hypertension (idiopathic) with ulcer and inflammation of left lower extremity Type 2 diabetes mellitus with other skin ulcer Lymphedema, not elsewhere classified Non-pressure chronic ulcer of other part of left lower leg with fat  layer exposed Essential (primary) hypertension Procedures Wound #1 Pre-procedure diagnosis of Wound #1 is a Diabetic Wound/Ulcer of the Lower Extremity located on the Left,Distal,Lateral Lower Leg .Severity of Tissue Pre Debridement is: Fat layer exposed. There was a Excisional Skin/Subcutaneous Tissue Debridement with a total area of 0.27 sq cm performed by Allen Derry, PA-C. With the following instrument(s): Curette Material removed includes Subcutaneous Tissue and Slough and. No specimens were taken. A time out was conducted at 08:53, prior to the start of the procedure. A Minimum amount of bleeding was controlled with Pressure. The procedure was tolerated well with a pain level of 0 throughout and a pain level of 0 following the procedure. Post Debridement Measurements: 0.5cm length x 0.7cm width x 0.2cm depth; 0.055cm^3 volume. Character of Wound/Ulcer Post Debridement is improved. Severity of Tissue Post Debridement is: Fat layer exposed. Post procedure Diagnosis Wound #1: Same as Pre-Procedure Plan ALESKA, CALE ([id number]) [id number] Page 7 of 8 Follow-up Appointments: Return Appointment in 1 week. Bathing/ Shower/ Hygiene: May shower; gently cleanse wound with antibacterial soap, rinse and pat dry prior to dressing wounds Edema Control - Lymphedema / Segmental Compressive Device / Other: Patient to wear own compression stockings. Remove compression stockings every night before going to bed and put on every morning when getting up. - bi lat Elevate, Exercise Daily and Avoid Standing for Long Periods of Time. Elevate legs to the level of the heart and pump ankles as often as possible Elevate leg(s) parallel to the floor when sitting. WOUND #1: - Lower Leg Wound Laterality: Left, Lateral, Distal Cleanser: Byram Ancillary Kit - 15 Day Supply (Generic) 3 x Per Week/30 Days Discharge Instructions: Use supplies as instructed; Kit contains: (15) Saline  Bullets; (15) 3x3 Gauze; 15 pr Gloves Prim Dressing: Prisma 4.34 (in) 3 x Per Week/30 Days ary Discharge Instructions: Moisten w/normal saline or sterile water; Cover wound as directed. Do not remove from wound bed. Secondary Dressing: ABD Pad 5x9 (in/in) 3 x Per Week/30 Days Discharge Instructions: Cover with ABD pad Secondary Dressing: Kerlix 4.5 x 4.1 (in/yd) 3 x Per Week/30 Days Discharge  Instructions: Apply Kerlix 4.5 x 4.1 (in/yd) as instructed Secured With: Medipore T - 6M Medipore H Soft Cloth Surgical T ape ape, 2x2 (in/yd) 3 x Per Week/30 Days 1. I am going to suggest that we have the patient continue to monitor for any signs of infection or worsening. Based on what I see I do think that we are headed in the right direction as far as healing is concerned. 2. I am going to suggest as well that the patient should continue with the silver collagen followed by the bordered foam dressing there found really small ones that she uses which seem to do very well for her. 3. I am going to recommend that she should continue to clean this in the shower using the Dial antibacterial soap I think this is still an appropriate way to go and is doing extremely well for her. We will see patient back for reevaluation in 1 week here in the clinic. If anything worsens or changes patient will contact our office for additional recommendations. Electronic Signature(s) Signed: 06/20/2023 9:00:42 AM By: Allen Derry PA-C Entered By: Allen Derry on 06/20/2023 09:00:42 -------------------------------------------------------------------------------- SuperBill Details Patient Name: Date of Service: [name], Nestor Ramp 06/20/2023 Medical Record Number: [id number] Patient Account Number: [id number] Date of Birth/Sex: Treating RN: 07/02/1946 (77 y.o. [name] Primary Care Provider: [name] Other Clinician: Referring Provider: Treating Provider/Extender: [name] in  Treatment: 8 Diagnosis Coding ICD-10 Codes Code Description [phone] Chronic venous hypertension (idiopathic) with ulcer and inflammation of left lower extremity E11.622 Type 2 diabetes mellitus with other skin ulcer I89.0 Lymphedema, not elsewhere classified L97.822 Non-pressure chronic ulcer of other part of left lower leg with fat layer exposed I10 Essential (primary) hypertension Facility Procedures Physician Procedures : CPT4 Code Description Modifier [id number] - WC PHYS SUBQ TISS 20 SQ CM ICD-10 Diagnosis Description L97.822 Non-pressure chronic ulcer of other part of left lower leg with fat layer exposed Quantity: 1 Electronic Signature(s) Signed: 06/20/2023 9:00:52 AM By: Allen Derry PA-C Entered By: Allen Derry on 06/20/2023 09:00:51

## 2023-06-23 NOTE — Progress Notes (Signed)
MYSTY, MORSEY ([id number]) [id number] Page 1 of 7 Visit Report for 06/20/2023 Arrival Information Details Patient Name: Date of Service: [name], [name] 06/20/2023 8:45 A M Medical Record Number: [id number] Patient Account Number: [id number] Date of Birth/Sex: Treating RN: [birth year] (77 y.o. [name] Primary [name] Benjermin Korber: [name] Other Clinician: Referring Judia Arnott: Treating Janiya Millirons/Extender: [name] in Treatment: 8 Visit Information History Since Last Visit Added or deleted any medications: No Patient Arrived: Ambulatory Any new allergies or adverse reactions: No Arrival Time: 08:38 Had a fall or experienced change in No Accompanied By: self activities of daily living that may affect Transfer Assistance: None risk of falls: Patient Identification Verified: Yes Signs or symptoms of abuse/neglect since last visito No Secondary Verification Process Completed: Yes Hospitalized since last visit: No Patient Requires Transmission-Based Precautions: No Implantable device outside of the clinic excluding No Patient Has Alerts: No cellular tissue based products placed in the center since last visit: Has Dressing in Place as Prescribed: Yes Has Compression in Place as Prescribed: Yes Pain Present Now: No Electronic Signature(s) Signed: 06/23/2023 8:03:28 AM By: [name] Entered By: Yevonne Pax on 06/20/2023 08:39:23 -------------------------------------------------------------------------------- Lower Extremity Assessment Details Patient Name: Date of Service: [name], [name] 06/20/2023 8:45 A M Medical Record Number: [id number] Patient Account Number: [id number] Date of Birth/Sex: Treating RN: Feb 25, 1946 (77 y.o. [name] Primary [name] Kessie Croston: [name] Other Clinician: Referring Dinorah Masullo: Treating Marijke Guadiana/Extender: [name] in Treatment: 8 Edema  Assessment Assessed: [Left: No] [Right: No] Edema: [Left: N] [Right: o] Calf Left: Right: Point of Measurement: 35 cm From Medial Instep 37 cm SHELEEN, EMICK J ([id number]) [id number] Page 2 of 7 Ankle Left: Right: Point of Measurement: 10 cm From Medial Instep 22 cm Vascular Assessment Pulses: Dorsalis Pedis Palpable: [Left:Yes] Extremity colors, hair growth, and conditions: Extremity Color: [Left:Hyperpigmented] Hair Growth on Extremity: [Left:No] Temperature of Extremity: [Left:Warm] Capillary Refill: [Left:< 3 seconds] Dependent Rubor: [Left:No] Blanched when Elevated: [Left:No No] Toe Nail Assessment Left: Right: Thick: No Discolored: No Deformed: No Improper Length and Hygiene: No Electronic Signature(s) Signed: 06/23/2023 8:03:28 AM By: [name] Entered By: Yevonne Pax on 06/20/2023 08:44:06 -------------------------------------------------------------------------------- Multi Wound Chart Details Patient Name: Date of Service: [name], [name] J. 06/20/2023 8:45 A M Medical Record Number: [id number] Patient Account Number: [id number] Date of Birth/Sex: Treating RN: 08/29/1946 (77 y.o. [name] Primary [name] Tajah Noguchi: [name] Other Clinician: Referring Clements Toro: Treating Jc Veron/Extender: [name] in Treatment: 8 Vital Signs Height(in): 68 Pulse(bpm): 63 Weight(lbs): 200 Blood Pressure(mmHg): 153/57 Body Mass Index(BMI): 30.4 Temperature(F): 97.9 Respiratory Rate(breaths/min): 18 [1:Photos:] [N/A:N/A] Left, Distal, Lateral Lower Leg N/A N/A Wound Location: Trauma N/A N/A Wounding Event: VENNESSA, CHREST ([id number]) [id number] Page 3 of 7 Diabetic Wound/Ulcer of the Lower N/A N/A Primary Etiology: Extremity Lymphedema, Hypertension, Type II N/A N/A Comorbid History: Diabetes 02/07/2023 N/A N/A Date Acquired: 8 N/A N/A Weeks of Treatment: Open N/A  N/A Wound Status: No N/A N/A Wound Recurrence: 0.5x0.7x0.2 N/A N/A Measurements L x W x D (cm) 0.275 N/A N/A A (cm) : rea 0.055 N/A N/A Volume (cm) : 22.10% N/A N/A % Reduction in A rea: 48.10% N/A N/A % Reduction in Volume: Grade 1 N/A N/A Classification: Medium N/A N/A Exudate A mount: Serosanguineous N/A N/A Exudate Type: red, brown N/A N/A Exudate Color: Small (1-33%) N/A N/A Granulation A mount: Red N/A N/A Granulation Quality: Large (67-100%) N/A N/A Necrotic A mount: Fat Layer (  Subcutaneous Tissue): Yes N/A N/A Exposed Structures: Fascia: No Tendon: No Muscle: No Joint: No Bone: No None N/A N/A Epithelialization: Treatment Notes Electronic Signature(s) Signed: 06/23/2023 8:03:28 AM By: [name] Entered By: Yevonne Pax on 06/20/2023 08:44:34 -------------------------------------------------------------------------------- Multi-Disciplinary [name] Plan Details Patient Name: Date of Service: [name], [name] J. 06/20/2023 8:45 A M Medical Record Number: [id number] Patient Account Number: [id number] Date of Birth/Sex: Treating RN: 22-Jun-1946 (77 y.o. [name] Primary [name] Alee Gressman: [name] Other Clinician: Referring Chanci Ojala: Treating Regenia Erck/Extender: [name] in Treatment: 8 Active Inactive Necrotic Tissue Nursing Diagnoses: Knowledge deficit related to management of necrotic/devitalized tissue Goals: Patient/caregiver will verbalize understanding of reason and process for debridement of necrotic tissue Date Initiated: 04/22/2023 Target Resolution Date: 05/22/2023 Goal Status: Active Interventions: Assess patient pain level pre-, during and post procedure and prior to discharge Notes: [name], [name] ([id number]) [id number] Page 4 of 7 Nursing Diagnoses: Knowledge deficit related to ulceration/compromised skin integrity Goals: Patient/caregiver  will verbalize understanding of skin [name] regimen Date Initiated: 04/22/2023 Target Resolution Date: 05/22/2023 Goal Status: Active Ulcer/skin breakdown will have a volume reduction of 30% by week 4 Date Initiated: 04/22/2023 Target Resolution Date: 05/22/2023 Goal Status: Active Ulcer/skin breakdown will have a volume reduction of 50% by week 8 Date Initiated: 04/22/2023 Target Resolution Date: 06/22/2023 Goal Status: Active Ulcer/skin breakdown will have a volume reduction of 80% by week 12 Date Initiated: 04/22/2023 Target Resolution Date: 07/23/2023 Goal Status: Active Ulcer/skin breakdown will heal within 14 weeks Date Initiated: 04/22/2023 Target Resolution Date: 08/22/2023 Goal Status: Active Interventions: Assess patient/caregiver ability to obtain necessary supplies Assess patient/caregiver ability to perform ulcer/skin [name] regimen upon admission and as needed Assess ulceration(s) every visit Notes: Electronic Signature(s) Signed: 06/23/2023 8:03:28 AM By: [name] Entered By: Yevonne Pax on 06/20/2023 08:44:54 -------------------------------------------------------------------------------- Pain Assessment Details Patient Name: Date of Service: [name], [name] 06/20/2023 8:45 A M Medical Record Number: [id number] Patient Account Number: [id number] Date of Birth/Sex: Treating RN: 1946/07/06 (77 y.o. [name] Primary [name] Faizan Geraci: [name] Other Clinician: Referring Merlyn Conley: Treating Harmon Bommarito/Extender: [name] in Treatment: 8 Active Problems Location of Pain Severity and Description of Pain Patient Has Paino No Site Locations [place], [place] J ([id number]) [id number] Page 5 of 7 Pain Management and Medication Current Pain Management: Electronic Signature(s) Signed: 06/23/2023 8:03:28 AM By: [name] Entered By: Yevonne Pax on 06/20/2023  08:39:57 -------------------------------------------------------------------------------- Patient/Caregiver Education Details Patient Name: Date of Service: [name] 8/12/2024andnbsp8:45 A M Medical Record Number: [id number] Patient Account Number: [id number] Date of Birth/Gender: Treating RN: February 06, 1946 (77 y.o. [name] Primary [name] Physician: [name] Other Clinician: Referring Physician: Treating Physician/Extender: [name] in Treatment: 8 Education Assessment Education Provided To: Patient Education Topics Provided Wound/Skin Impairment: Handouts: Caring for Your Ulcer Methods: Explain/Verbal Responses: State content correctly Electronic Signature(s) Signed: 06/23/2023 8:03:28 AM By: [name] Entered By: Yevonne Pax on 06/20/2023 08:45:21 -------------------------------------------------------------------------------- Wound Assessment Details Patient Name: Date of Service: [name], [name] 06/20/2023 8:45 A M Medical Record Number: [id number] Patient Account Number: [id number] Date of Birth/Sex: Treating RN: Apr 27, 1946 (77 y.o. [name] Primary [name] Annamae Shivley: [name] Other Clinician: Referring Janelle Culton: Treating Gerod Caligiuri/Extender: [name] in Treatment: 8 Wound Status [name], [name] ([id number]) [id number] Page 6 of 7 Wound Number: 1 Primary Etiology: Diabetic Wound/Ulcer of the Lower Extremity Wound Location: Left, Distal, Lateral Lower Leg Wound Status: Open  Wounding Event: Trauma Comorbid History: Lymphedema, Hypertension, Type II Diabetes Date Acquired: 02/07/2023 Weeks Of Treatment: 8 Clustered Wound: No Photos Wound Measurements Length: (cm) 0.5 Width: (cm) 0.7 Depth: (cm) 0.2 Area: (cm) 0.275 Volume: (cm) 0.055 % Reduction in Area: 22.1% % Reduction in Volume: 48.1% Epithelialization: None Tunneling: No Undermining:  No Wound Description Classification: Grade 1 Exudate Amount: Medium Exudate Type: Serosanguineous Exudate Color: red, brown Foul Odor After Cleansing: No Slough/Fibrino Yes Wound Bed Granulation Amount: Small (1-33%) Exposed Structure Granulation Quality: Red Fascia Exposed: No Necrotic Amount: Large (67-100%) Fat Layer (Subcutaneous Tissue) Exposed: Yes Necrotic Quality: Adherent Slough Tendon Exposed: No Muscle Exposed: No Joint Exposed: No Bone Exposed: No Electronic Signature(s) Signed: 06/23/2023 8:03:28 AM By: [name] Entered By: Yevonne Pax on 06/20/2023 08:43:03 -------------------------------------------------------------------------------- Vitals Details Patient Name: Date of Service: [name], [name] J. 06/20/2023 8:45 A M Medical Record Number: [id number] Patient Account Number: [id number] Date of Birth/Sex: Treating RN: 1946-06-18 (77 y.o. [name] Primary [name] Aspen Lawrance: [name] Other Clinician: Referring Quetzaly Ebner: Treating Chalsey Leeth/Extender: [name] in Treatment: 8 Vital Signs Time Taken: 08:39 Temperature (F): 97.9 Height (in): 68 Pulse (bpm): 63 [name], Christine J ([id number]) [id number] Page 7 of 7 Weight (lbs): 200 Respiratory Rate (breaths/min): 18 Body Mass Index (BMI): 30.4 Blood Pressure (mmHg): 153/57 Reference Range: 80 - 120 mg / dl Electronic Signature(s) Signed: 06/23/2023 8:03:28 AM By: [name] Entered By: Yevonne Pax on 06/20/2023 08:39:47

## 2023-06-27 ENCOUNTER — Encounter: Payer: Federal, State, Local not specified - PPO | Admitting: Physician Assistant

## 2023-06-27 DIAGNOSIS — I87332 Chronic venous hypertension (idiopathic) with ulcer and inflammation of left lower extremity: Secondary | ICD-10-CM | POA: Diagnosis not present

## 2023-06-27 NOTE — Progress Notes (Addendum)
[name] ([id number]) [id number] Page 1 of 7 Visit Report for 06/27/2023 Chief Complaint Document Details Patient Name: Date of Service: [name] 06/27/2023 8:45 A M Medical Record Number: [id number] Patient Account Number: [id number] Date of Birth/Sex: Treating RN: August 26, 1946 (77 y.o. [name] Primary Care Provider: [name] Other Clinician: Referring Provider: Treating Provider/Extender: [name] in Treatment: 9 Information Obtained from: Patient Chief Complaint Left LE Ulcer Electronic Signature(s) Signed: 06/27/2023 8:55:27 AM By: Allen Derry PA-C Entered By: Allen Derry on 06/27/2023 05:55:27 -------------------------------------------------------------------------------- HPI Details Patient Name: Date of Service: Christine Saxon J. 06/27/2023 8:45 A M Medical Record Number: [id number] Patient Account Number: [id number] Date of Birth/Sex: Treating RN: 07-07-1946 (77 y.o. [name] Primary Care Provider: [name] Other Clinician: Referring Provider: Treating Provider/Extender: [name] in Treatment: 9 History of Present Illness HPI Description: 04-22-2023 upon evaluation patient appears to be doing somewhat poorly in regard to a wound on the left distal/lateral lower extremity and about the ankle region. She tells me this occurred on February 07, 2023 as a result of trying to get a very tight and new compression sock off. It was getting stuck and she somewhat twisted trying to get it off and pulled skin off. Since that time she has been having a hard time getting this to heal. Fortunately there does not appear to be any signs of active infection which is great news. She did have an ABI of 0.75 today. With that being said I am actually decently pleased with how the wound looks it does have some necrotic tissue but there is going to need to be some sharp  debridement to clear away some of the dead tissue as well today. Patient does have a history of diabetes mellitus type 2, lymphedema, hypertension, and chronic venous insufficiency. 05-02-2023 upon evaluation today patient appears to be doing well currently in regard to her wound. She is actually showing signs of improvement and very pleased in that regard and fortunately I do not see any signs of active infection locally or systemically at this time which is great news. No fevers, chills, nausea, vomiting, or diarrhea. 05-09-2023 upon evaluation today patient appears to be doing well currently in regard to her wound is not worse but is also not better. I discussed with her again today that we will get a need to likely compression wrap for her. With that being said she is adamantly opposed to this and wants to get 1 more week given something to try and I recommend to be seen by double layer Tubigrip to see if this can be beneficial. [name], [name] ([id number]) [id number] Page 2 of 7 05-17-2023 upon evaluation today patient appears to be doing well currently in regard to her wound although it still very inflamed on the posterior aspect of her ankle. I discussed that with her today. With that being said I do not see any evidence of general worsening which is great news but also do not see a significant amount of improvement compared to last week in my opinion. I discussed this with her at this point today. 05-23-2023 upon evaluation today patient appears to be doing well currently in regard to her wound. This is actually showing some slough and biofilm on the surface of the wound. The good news is I do believe that we cleaned some of the soften the scar ischial little bit of improvement with that being said the periwound is being  very irritated I think the Vashe may have irritated her based on what she is telling me. Fortunately I do not see any evidence of active infection  locally nor systemically at this time which is great news. No fevers, chills, nausea, vomiting, or diarrhea. 7/22; wound on the left lateral lower leg in the setting of significant chronic venous hypertension. We have been using Prisma. She would not allow a compression wrap although she is using a support stocking. 06-06-2023 upon evaluation today patient's wound actually showing signs of significant improvement with regard to her wound. She has been tolerating the dressing changes without complication. Fortunately there does not appear to be any signs of active infection locally nor systemically at this time which is great news. No fevers, chills, nausea, vomiting, or diarrhea. 06-20-2023 upon evaluation today patient appears to be doing well currently in regard to her wound which is actually showing signs of significant improvement. Fortunately I do not see any evidence of worsening infection I do believe that the patient is moving in the right direction which is great news and in general I think that we are on the right track here. 06-27-2023 upon evaluation today patient appears to be doing well currently in regard to her wound. She has been tolerating the dressing changes without complication and in general I do feel like there were making really good headway towards complete closure. I do not see any signs of active infection locally or systemically at this time. Electronic Signature(s) Signed: 06/27/2023 9:59:34 AM By: Allen Derry PA-C Entered By: Allen Derry on 06/27/2023 06:59:34 -------------------------------------------------------------------------------- Physical Exam Details Patient Name: Date of Service: [name] 06/27/2023 8:45 A M Medical Record Number: [id number] Patient Account Number: [id number] Date of Birth/Sex: Treating RN: 12-22-1945 (77 y.o. [name] Primary Care Provider: [name] Other Clinician: Referring Provider: Treating Provider/Extender:  [name] in Treatment: 9 Constitutional Well-nourished and well-hydrated in no acute distress. Respiratory normal breathing without difficulty. Psychiatric this patient is able to make decisions and demonstrates good insight into disease process. Alert and Oriented x 3. pleasant and cooperative. Notes Upon inspection patient's wound bed actually showed signs of good granulation epithelization at this point. Fortunately I think that the patient is headed in the appropriate direction this is very slow but does not appear to be infected she is wearing compression and keeping that under good control as well. I really think that we are on the right path towards closure. Electronic Signature(s) Signed: 06/27/2023 9:59:58 AM By: Allen Derry PA-C Entered By: Allen Derry on 06/27/2023 06:59:58 Lewellyn, Nestor Ramp ([id number]) [id number] Page 3 of 7 -------------------------------------------------------------------------------- Physician Orders Details Patient Name: Date of Service: RAKEISHA, BANDURA 06/27/2023 8:45 A M Medical Record Number: [id number] Patient Account Number: [id number] Date of Birth/Sex: Treating RN: 12-05-1945 (77 y.o. [name] Primary Care Provider: [name] Other Clinician: Referring Provider: Treating Provider/Extender: [name] in Treatment: 9 Verbal / Phone Orders: No Diagnosis Coding ICD-10 Coding Code Description [phone] Chronic venous hypertension (idiopathic) with ulcer and inflammation of left lower extremity E11.622 Type 2 diabetes mellitus with other skin ulcer I89.0 Lymphedema, not elsewhere classified L97.822 Non-pressure chronic ulcer of other part of left lower leg with fat layer exposed I10 Essential (primary) hypertension Follow-up Appointments Return Appointment in 1 week. Bathing/ Shower/ Hygiene May shower; gently cleanse wound with antibacterial  soap, rinse and pat dry prior to dressing wounds Edema Control - Lymphedema / Segmental Compressive Device /  Other Patient to wear own compression stockings. Remove compression stockings every night before going to bed and put on every morning when getting up. - bi lat Elevate, Exercise Daily and A void Standing for Long Periods of Time. Elevate legs to the level of the heart and pump ankles as often as possible Elevate leg(s) parallel to the floor when sitting. Wound Treatment Wound #1 - Lower Leg Wound Laterality: Left, Lateral, Distal Cleanser: Byram Ancillary Kit - 15 Day Supply (Generic) 3 x Per Week/30 Days Discharge Instructions: Use supplies as instructed; Kit contains: (15) Saline Bullets; (15) 3x3 Gauze; 15 pr Gloves Prim Dressing: Prisma 4.34 (in) 3 x Per Week/30 Days ary Discharge Instructions: Moisten w/normal saline or sterile water; Cover wound as directed. Do not remove from wound bed. Secondary Dressing: (BORDER) Zetuvit Plus SILICONE BORDER Dressing 4x4 (in/in) 3 x Per Week/30 Days Discharge Instructions: Please do not put silicone bordered dressings under wraps. Use non-bordered dressing only. Electronic Signature(s) Signed: 06/27/2023 5:35:17 PM By: Allen Derry PA-C Signed: 07/08/2023 11:59:03 AM By: [name] Entered By: Yevonne Pax on 06/27/2023 06:18:36 Gayla Doss ([id number]) [id number] Page 4 of 7 -------------------------------------------------------------------------------- Problem List Details Patient Name: Date of Service: BARBARELLA, WIKER 06/27/2023 8:45 A M Medical Record Number: [id number] Patient Account Number: [id number] Date of Birth/Sex: Treating RN: [birth year] (77 y.o. 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 06/27/2023 8:45 A M Medical Record Number: [id number] Patient Account Number: [id number] Date of Birth/Sex: Treating RN: 1945/12/14 (77 y.o. Myshia, Blankinship, [place] J ([id number]) [id number] Page 5 of 7 Primary Care Provider: [name] Other Clinician: Referring Provider: Treating Provider/Extender: [name] in Treatment: 9 Subjective Chief Complaint Information obtained from Patient Left LE Ulcer History of Present Illness (HPI) 04-22-2023 upon evaluation patient appears to be doing somewhat poorly in regard to a wound on the left distal/lateral lower extremity and about the ankle region. She tells me this occurred on February 07, 2023 as a result of trying to get a very tight and new compression sock off. It was getting stuck and she somewhat twisted trying to get it off and pulled skin off. Since that time she has been having a hard time getting this to heal. Fortunately there does not appear to be any signs of active infection which is great news. She did have an ABI of 0.75 today. With that being said I am actually decently pleased with how the wound looks it does have some necrotic tissue but  there is going to need to be some sharp debridement to clear away some of the dead tissue as well today. Patient does have a history of diabetes mellitus type 2, lymphedema, hypertension, and chronic venous insufficiency. 05-02-2023 upon evaluation today patient appears to be doing well currently in  regard to her wound. She is actually showing signs of improvement and very pleased in that regard and fortunately I do not see any signs of active infection locally or systemically at this time which is great news. No fevers, chills, nausea, vomiting, or diarrhea. 05-09-2023 upon evaluation today patient appears to be doing well currently in regard to her wound is not worse but is also not better. I discussed with her again today that we will get a need to likely compression wrap for her. With that being said she is adamantly opposed to this and wants to get 1 more week given something to try and I recommend to be seen by double layer Tubigrip to see if this can be beneficial. 05-17-2023 upon evaluation today patient appears to be doing well currently in regard to her wound although it still very inflamed on the posterior aspect of her ankle. I discussed that with her today. With that being said I do not see any evidence of general worsening which is great news but also do not see a significant amount of improvement compared to last week in my opinion. I discussed this with her at this point today. 05-23-2023 upon evaluation today patient appears to be doing well currently in regard to her wound. This is actually showing some slough and biofilm on the surface of the wound. The good news is I do believe that we cleaned some of the soften the scar ischial little bit of improvement with that being said the periwound is being very irritated I think the Vashe may have irritated her based on what she is telling me. Fortunately I do not see any evidence of active infection locally nor systemically at this time which is  great news. No fevers, chills, nausea, vomiting, or diarrhea. 7/22; wound on the left lateral lower leg in the setting of significant chronic venous hypertension. We have been using Prisma. She would not allow a compression wrap although she is using a support stocking. 06-06-2023 upon evaluation today patient's wound actually showing signs of significant improvement with regard to her wound. She has been tolerating the dressing changes without complication. Fortunately there does not appear to be any signs of active infection locally nor systemically at this time which is great news. No fevers, chills, nausea, vomiting, or diarrhea. 06-20-2023 upon evaluation today patient appears to be doing well currently in regard to her wound which is actually showing signs of significant improvement. Fortunately I do not see any evidence of worsening infection I do believe that the patient is moving in the right direction which is great news and in general I think that we are on the right track here. 06-27-2023 upon evaluation today patient appears to be doing well currently in regard to her wound. She has been tolerating the dressing changes without complication and in general I do feel like there were making really good headway towards complete closure. I do not see any signs of active infection locally or systemically at this time. Objective Constitutional Well-nourished and well-hydrated in no acute distress. Vitals Time Taken: 8:44 AM, Height: 68 in, Weight: 200 lbs, BMI: 30.4, Temperature: 98 F, Pulse: 70 bpm, Respiratory Rate: 18 breaths/min, Blood Pressure: 158/65 mmHg. Respiratory normal breathing without difficulty. Psychiatric this patient is able to make decisions and demonstrates good insight into disease process. Alert and Oriented x 3. pleasant and cooperative. General Notes: Upon inspection patient's wound bed actually showed signs of good granulation epithelization at this point. Fortunately  I think  that the patient is headed in the appropriate direction this is very slow but does not appear to be infected she is wearing compression and keeping that under good control as well. I really think that we are on the right path towards closure. Integumentary (Hair, Skin) Wound #1 status is Open. Original cause of wound was Trauma. The date acquired was: 02/07/2023. The wound has been in treatment 9 weeks. The wound is located on the Left,Distal,Lateral Lower Leg. The wound measures 0.5cm length x 0.6cm width x 0.2cm depth; 0.236cm^2 area and 0.047cm^3 volume. There is Fat Layer (Subcutaneous Tissue) exposed. There is no tunneling or undermining noted. There is a medium amount of serosanguineous drainage noted. There is small (1-33%) red granulation within the wound bed. There is a large (67-100%) amount of necrotic tissue within the wound bed including Adherent Slough. MALAYAH, DUSCH ([id number]) [id number] Page 6 of 7 Assessment Active Problems ICD-10 Chronic venous hypertension (idiopathic) with ulcer and inflammation of left lower extremity Type 2 diabetes mellitus with other skin ulcer Lymphedema, not elsewhere classified Non-pressure chronic ulcer of other part of left lower leg with fat layer exposed Essential (primary) hypertension Plan Follow-up Appointments: Return Appointment in 1 week. Bathing/ Shower/ Hygiene: May shower; gently cleanse wound with antibacterial soap, rinse and pat dry prior to dressing wounds Edema Control - Lymphedema / Segmental Compressive Device / Other: Patient to wear own compression stockings. Remove compression stockings every night before going to bed and put on every morning when getting up. - bi lat Elevate, Exercise Daily and Avoid Standing for Long Periods of Time. Elevate legs to the level of the heart and pump ankles as often as possible Elevate leg(s) parallel to the floor when sitting. WOUND #1: - Lower Leg  Wound Laterality: Left, Lateral, Distal Cleanser: Byram Ancillary Kit - 15 Day Supply (Generic) 3 x Per Week/30 Days Discharge Instructions: Use supplies as instructed; Kit contains: (15) Saline Bullets; (15) 3x3 Gauze; 15 pr Gloves Prim Dressing: Prisma 4.34 (in) 3 x Per Week/30 Days ary Discharge Instructions: Moisten w/normal saline or sterile water; Cover wound as directed. Do not remove from wound bed. Secondary Dressing: (BORDER) Zetuvit Plus SILICONE BORDER Dressing 4x4 (in/in) 3 x Per Week/30 Days Discharge Instructions: Please do not put silicone bordered dressings under wraps. Use non-bordered dressing only. 1. I would recommend that we have the patient continue to monitor for any signs of infection or worsening. Based on what I am seeing I do believe that she is making really good headway towards closure. 2. I am going to recommend the patient continue with the Prisma followed by the bordered foam. 3. She will continue with her own compression socks which seem to be doing a great job she is very compliant with this. We will see patient back for reevaluation in 1 week here in the clinic. If anything worsens or changes patient will contact our office for additional recommendations. Electronic Signature(s) Signed: 06/27/2023 10:00:26 AM By: Allen Derry PA-C Entered By: Allen Derry on 06/27/2023 07:00:25 -------------------------------------------------------------------------------- SuperBill Details Patient Name: Date of Service: [name], Nestor Ramp 06/27/2023 Medical Record Number: [id number] Patient Account Number: [id number] Date of Birth/Sex: Treating RN: August 03, 1946 (77 y.o. [name] Primary Care Provider: [name] Other Clinician: Referring Provider: Treating Provider/Extender: [name] in Treatment: 9 Diagnosis Coding ICD-10 Codes Code Description [phone] Chronic venous hypertension (idiopathic) with ulcer and inflammation of left  lower extremity SHEREA, FARNELL ([id number]) [id number] Page 7 of 7 E11.622 Type  2 diabetes mellitus with other skin ulcer I89.0 Lymphedema, not elsewhere classified L97.822 Non-pressure chronic ulcer of other part of left lower leg with fat layer exposed I10 Essential (primary) hypertension Facility Procedures : CPT4 Code: 16109604 Description: 54098 - WOUND CARE VISIT-LEV 2 EST PT Modifier: Quantity: 1 Physician Procedures : CPT4 Code Description Modifier [id number] - WC PHYS LEVEL 3 - EST PT ICD-10 Diagnosis Description I87.332 Chronic venous hypertension (idiopathic) with ulcer and inflammation of left lower extremity E11.622 Type 2 diabetes mellitus with other skin  ulcer I89.0 Lymphedema, not elsewhere classified L97.822 Non-pressure chronic ulcer of other part of left lower leg with fat layer exposed Quantity: 1 Electronic Signature(s) Signed: 06/27/2023 10:01:40 AM By: Allen Derry PA-C Entered By: Allen Derry on 06/27/2023 07:01:40

## 2023-07-04 ENCOUNTER — Ambulatory Visit: Payer: Federal, State, Local not specified - PPO | Admitting: Internal Medicine

## 2023-07-08 NOTE — Progress Notes (Signed)
Christine Christine, Christine Christine ([id number]) [id number] Page 1 of 9 Visit Report for 06/27/2023 Arrival Information Details Patient Name: Date of Service: Christine Christine, Christine Christine 06/27/2023 8:45 A M Medical Record Number: [id number] Patient Account Number: [id number] Date of Birth/Sex: Treating RN: 04-30-1946 (77 y.o. [name] Primary Christine Bernedette Auston: [name] Other Clinician: Referring [name]: Treating [name]/Extender: [name] in Treatment: 9 Visit Information History Since Last Visit Added or deleted any medications: No Patient Arrived: Ambulatory Any new allergies or adverse reactions: No Arrival Time: 08:44 Had a fall or experienced change in No Accompanied By: self activities of daily living that may affect Transfer Assistance: None risk of falls: Patient Identification Verified: Yes Signs or symptoms of abuse/neglect since last visito No Secondary Verification Process Completed: Yes Hospitalized since last visit: No Patient Requires Transmission-Based Precautions: No Implantable device outside of the clinic excluding No Patient Has Alerts: No cellular tissue based products placed in the center since last visit: Has Dressing in Place as Prescribed: Yes Has Compression in Place as Prescribed: Yes Pain Present Now: No Electronic Signature(s) Signed: 07/08/2023 11:59:03 AM By: [name] Entered By: Yevonne Pax on 06/27/2023 05:44:51 -------------------------------------------------------------------------------- Clinic Level of Christine Assessment Details Patient Name: Date of Service: Christine Christine 06/27/2023 8:45 A M Medical Record Number: [id number] Patient Account Number: [id number] Date of Birth/Sex: Treating RN: 1946-06-09 (77 y.o. [name] Primary Christine Christine: [name] Other Clinician: Referring [name]: Treating [name]/Extender: [name] in Treatment:  9 Clinic Level of Christine Assessment Items TOOL 4 Quantity Score X- 1 0 Use when only an EandM is performed on FOLLOW-UP visit ASSESSMENTS - Nursing Assessment / Reassessment X- 1 10 Reassessment of Co-morbidities (includes updates in patient status) X- 1 5 Reassessment of Adherence to Treatment Plan Christine Christine, Christine Christine ([id number]) [id number] Page 2 of 9 ASSESSMENTS - Wound and Skin A ssessment / Reassessment X - Simple Wound Assessment / Reassessment - one wound 1 5 []  - 0 Complex Wound Assessment / Reassessment - multiple wounds []  - 0 Dermatologic / Skin Assessment (not related to wound area) ASSESSMENTS - Focused Assessment []  - 0 Circumferential Edema Measurements - multi extremities []  - 0 Nutritional Assessment / Counseling / Intervention []  - 0 Lower Extremity Assessment (monofilament, tuning fork, pulses) []  - 0 Peripheral Arterial Disease Assessment (using hand held doppler) ASSESSMENTS - Ostomy and/or Continence Assessment and Christine []  - 0 Incontinence Assessment and Management []  - 0 Ostomy Christine Assessment and Management (repouching, etc.) PROCESS - Coordination of Christine X - Simple Patient / Family Education for ongoing Christine 1 15 []  - 0 Complex (extensive) Patient / Family Education for ongoing Christine []  - 0 Staff obtains Chiropractor, Records, T Results / Process Orders est []  - 0 Staff telephones HHA, Nursing Homes / Clarify orders / etc []  - 0 Routine Transfer to another Facility (non-emergent condition) []  - 0 Routine Hospital Admission (non-emergent condition) []  - 0 New Admissions / Manufacturing engineer / Ordering NPWT Apligraf, etc. , []  - 0 Emergency Hospital Admission (emergent condition) X- 1 10 Simple Discharge Coordination []  - 0 Complex (extensive) Discharge Coordination PROCESS - Special Needs []  - 0 Pediatric / Minor Patient Management []  - 0 Isolation Patient Management []  - 0 Hearing / Language / Visual special  needs []  - 0 Assessment of Community assistance (transportation, D/C planning, etc.) []  - 0 Additional assistance / Altered mentation []  - 0 Support Surface(s) Assessment (bed, cushion, seat, etc.) INTERVENTIONS - Wound  Cleansing / Measurement X - Simple Wound Cleansing - one wound 1 5 []  - 0 Complex Wound Cleansing - multiple wounds X- 1 5 Wound Imaging (photographs - any number of wounds) []  - 0 Wound Tracing (instead of photographs) X- 1 5 Simple Wound Measurement - one wound []  - 0 Complex Wound Measurement - multiple wounds INTERVENTIONS - Wound Dressings X - Small Wound Dressing one or multiple wounds 1 10 []  - 0 Medium Wound Dressing one or multiple wounds []  - 0 Large Wound Dressing one or multiple wounds []  - 0 Application of Medications - topical []  - 0 Application of Medications - injection INTERVENTIONS - Miscellaneous []  - 0 External ear exam Christine Christine, Christine Christine ([id number]) [id number] Page 3 of 9 []  - 0 Specimen Collection (cultures, biopsies, blood, body fluids, etc.) []  - 0 Specimen(s) / Culture(s) sent or taken to Lab for analysis []  - 0 Patient Transfer (multiple staff / Michiel Sites Lift / Similar devices) []  - 0 Simple Staple / Suture removal (25 or less) []  - 0 Complex Staple / Suture removal (26 or more) []  - 0 Hypo / Hyperglycemic Management (close monitor of Blood Glucose) []  - 0 Ankle / Brachial Index (ABI) - do not check if billed separately X- 1 5 Vital Signs Has the patient been seen at the hospital within the last three years: Yes Total Score: 75 Level Of Christine: New/Established - Level 2 Electronic Signature(s) Signed: 07/08/2023 11:59:03 AM By: [name] Entered By: Yevonne Pax on 06/27/2023 06:17:39 -------------------------------------------------------------------------------- Encounter Discharge Information Details Patient Name: Date of Service: Christine Christine, [name] J. 06/27/2023 8:45 A M Medical Record  Number: [id number] Patient Account Number: [id number] Date of Birth/Sex: Treating RN: Oct 28, 1946 (77 y.o. [name] Primary Christine Daijah Scrivens: [name] Other Clinician: Referring Jayland Null: Treating Rayce Brahmbhatt/Extender: [name] in Treatment: 9 Encounter Discharge Information Items Discharge Condition: Stable Ambulatory Status: Ambulatory Discharge Destination: Home Transportation: Private Auto Accompanied By: self Schedule Follow-up Appointment: Yes Clinical Summary of Christine: Electronic Signature(s) Signed: 07/08/2023 11:59:03 AM By: [name] Entered By: Yevonne Pax on 06/27/2023 06:19:13 Lower Extremity Assessment Details -------------------------------------------------------------------------------- Christine Christine ([id number]) [id number] Page 4 of 9 Patient Name: Date of Service: YSA, EVOY 06/27/2023 8:45 A M Medical Record Number: [id number] Patient Account Number: [id number] Date of Birth/Sex: Treating RN: [birth year] (77 y.o. [name] Primary Christine Marionna Gonia: [name] Other Clinician: Referring Durante Violett: Treating Denisia Harpole/Extender: [name] in Treatment: 9 Edema Assessment Left: Right: Assessed: No No Edema: No Calf Left: Right: Point of Measurement: 35 cm From Medial Instep 37 cm Ankle Left: Right: Point of Measurement: 10 cm From Medial Instep 23 cm Vascular Assessment Left: Right: Pulses: Dorsalis Pedis Palpable: Yes Extremity colors, hair growth, and conditions: Extremity Color: Hyperpigmented Hair Growth on Extremity: No Temperature of Extremity: Warm Capillary Refill: < 3 seconds Dependent Rubor: No Blanched when Elevated: No Lipodermatosclerosis: No Toe Nail Assessment Left: Right: Thick: No Discolored: No Deformed: No Improper Length and Hygiene: No Electronic Signature(s) Signed: 07/08/2023 11:59:03 AM By: Yevonne Pax  RN Entered By: Yevonne Pax on 06/27/2023 05:50:28 -------------------------------------------------------------------------------- Multi Wound Chart Details Patient Name: Date of Service: Christine Christine, [name] J. 06/27/2023 8:45 A M Medical Record Number: [id number] Patient Account Number: [id number] Date of Birth/Sex: Treating RN: Jun 30, 1946 (77 y.o. [name] Primary Christine Shaunn Tackitt: [name] Other Clinician: Referring Eilis Chestnutt: Treating Raquel Sayres/Extender: [name] in Treatment: 9 Vital Signs Height(in): 68 Pulse(bpm): 70 Weight(lbs): 200  Blood Pressure(mmHg): 158/65 Body Mass Index(BMI): 30.4 Temperature(F): 98 Miano, Eiko J ([id number]) [id number] Page 5 of 9 Respiratory Rate(breaths/min): 18 [1:Photos:] [N/A:N/A] Left, Distal, Lateral Lower Leg N/A N/A Wound Location: Trauma N/A N/A Wounding Event: Diabetic Wound/Ulcer of the Lower N/A N/A Primary Etiology: Extremity Lymphedema, Hypertension, Type II N/A N/A Comorbid History: Diabetes 02/07/2023 N/A N/A Date Acquired: 9 N/A N/A Weeks of Treatment: Open N/A N/A Wound Status: No N/A N/A Wound Recurrence: 0.5x0.6x0.2 N/A N/A Measurements L x W x D (cm) 0.236 N/A N/A A (cm) : rea 0.047 N/A N/A Volume (cm) : 33.10% N/A N/A % Reduction in A rea: 55.70% N/A N/A % Reduction in Volume: Grade 1 N/A N/A Classification: Medium N/A N/A Exudate A mount: Serosanguineous N/A N/A Exudate Type: red, brown N/A N/A Exudate Color: Small (1-33%) N/A N/A Granulation A mount: Red N/A N/A Granulation Quality: Large (67-100%) N/A N/A Necrotic A mount: Fat Layer (Subcutaneous Tissue): Yes N/A N/A Exposed Structures: Fascia: No Tendon: No Muscle: No Joint: No Bone: No None N/A N/A Epithelialization: Treatment Notes Electronic Signature(s) Signed: 07/08/2023 11:59:03 AM By: [name] Entered By: Yevonne Pax on 06/27/2023  05:50:39 -------------------------------------------------------------------------------- Multi-Disciplinary Christine Plan Details Patient Name: Date of Service: Christine Christine, [name] J. 06/27/2023 8:45 A M Medical Record Number: [id number] Patient Account Number: [id number] Date of Birth/Sex: Treating RN: [birth year] (77 y.o. [name] Primary Christine Brianny Soulliere: [name] Other Clinician: Referring Ondine Gemme: Treating Gustave Lindeman/Extender: [name] in Treatment: [address] Christine Christine, Christine Christine ([id number]) [id number] Page 6 of 9 Nursing Diagnoses: Knowledge deficit related to ulceration/compromised skin integrity Goals: Patient/caregiver will verbalize understanding of skin Christine regimen Date Initiated: 04/22/2023 Target Resolution Date: 08/22/2023 Goal Status: Active Ulcer/skin breakdown will have a volume reduction of 30% by week 4 Date Initiated: 04/22/2023 Date Inactivated: 06/27/2023 Target Resolution Date: 05/22/2023 Goal Status: Unmet Unmet Reason: comorbidities Ulcer/skin breakdown will have a volume reduction of 50% by week 8 Date Initiated: 04/22/2023 Date Inactivated: 06/27/2023 Target Resolution Date: 06/22/2023 Goal Status: Unmet Unmet Reason: comorbidities Ulcer/skin breakdown will have a volume reduction of 80% by week 12 Date Initiated: 04/22/2023 Target Resolution Date: 07/23/2023 Goal Status: Active Ulcer/skin breakdown will heal within 14 weeks Date Initiated: 04/22/2023 Target Resolution Date: 08/22/2023 Goal Status: Active Interventions: Assess patient/caregiver ability to obtain necessary supplies Assess patient/caregiver ability to perform ulcer/skin Christine regimen upon admission and as needed Assess ulceration(s) every visit Notes: Electronic Signature(s) Signed: 07/08/2023 11:59:03 AM By: [name] Entered By: Yevonne Pax on 06/27/2023  05:52:04 -------------------------------------------------------------------------------- Pain Assessment Details Patient Name: Date of Service: Christine Christine, Christine Christine 06/27/2023 8:45 A M Medical Record Number: [id number] Patient Account Number: [id number] Date of Birth/Sex: Treating RN: 18-Nov-1945 (78 y.o. [name] Primary Christine Aws Shere: [name] Other Clinician: Referring Shakeisha Horine: Treating Jalasia Eskridge/Extender: [name] in Treatment: 9 Active Problems Location of Pain Severity and Description of Pain Patient Has Paino No Site Locations [place], [place] J ([id number]) [id number] Page 7 of 9 Pain Management and Medication Current Pain Management: Electronic Signature(s) Signed: 07/08/2023 11:59:03 AM By: [name] Entered By: Yevonne Pax on 06/27/2023 05:45:15 -------------------------------------------------------------------------------- Patient/Caregiver Education Details Patient Name: Date of Service: Christine Slipper 8/19/2024andnbsp8:45 A M Medical Record Number: [id number] Patient Account Number: [id number] Date of Birth/Gender: Treating RN: [birth year] (77 y.o. [name] Primary Christine Physician: [name] Other Clinician: Referring Physician: Treating Physician/Extender: [name] in Treatment: 9 Education Assessment Education Provided To: Patient Education  Topics Provided Wound/Skin Impairment: Handouts: Caring for Your Ulcer Responses: State content correctly Electronic Signature(s) Signed: 07/08/2023 11:59:03 AM By: [name] Entered By: Yevonne Pax on 06/27/2023 05:52:18 Christine Christine ([id number]) [id number] Page 8 of 9 -------------------------------------------------------------------------------- Wound Assessment Details Patient Name: Date of Service: Christine Christine, Christine Christine 06/27/2023 8:45 A M Medical Record Number:  [id number] Patient Account Number: [id number] Date of Birth/Sex: Treating RN: June 22, 1946 (77 y.o. [name] Primary Christine Diamond Jentz: [name] Other Clinician: Referring Dennis Hegeman: Treating Xandria Gallaga/Extender: [name] in Treatment: 9 Wound Status Wound Number: 1 Primary Etiology: Diabetic Wound/Ulcer of the Lower Extremity Wound Location: Left, Distal, Lateral Lower Leg Wound Status: Open Wounding Event: Trauma Comorbid History: Lymphedema, Hypertension, Type II Diabetes Date Acquired: 02/07/2023 Weeks Of Treatment: 9 Clustered Wound: No Photos Wound Measurements Length: (cm) 0.5 Width: (cm) 0.6 Depth: (cm) 0.2 Area: (cm) 0.236 Volume: (cm) 0.047 % Reduction in Area: 33.1% % Reduction in Volume: 55.7% Epithelialization: None Tunneling: No Undermining: No Wound Description Classification: Grade 1 Exudate Amount: Medium Exudate Type: Serosanguineous Exudate Color: red, brown Foul Odor After Cleansing: No Slough/Fibrino Yes Wound Bed Granulation Amount: Small (1-33%) Exposed Structure Granulation Quality: Red Fascia Exposed: No Necrotic Amount: Large (67-100%) Fat Layer (Subcutaneous Tissue) Exposed: Yes Necrotic Quality: Adherent Slough Tendon Exposed: No Muscle Exposed: No Joint Exposed: No Bone Exposed: No Treatment Notes Wound #1 (Lower Leg) Wound Laterality: Left, Lateral, Distal Cleanser [place] Ancillary Kit - 15 Day Supply Discharge Instruction: Use supplies as instructed; Kit contains: (15) Saline Bullets; (15) 3x3 Gauze; 15 pr Gloves Peri-Wound Christine Christine Christine, Christine J ([id number]) [id number] Page 9 of 9 Topical Primary Dressing Prisma 4.34 (in) Discharge Instruction: Moisten w/normal saline or sterile water; Cover wound as directed. Do not remove from wound bed. Secondary Dressing (BORDER) Zetuvit Plus SILICONE BORDER Dressing 4x4 (in/in) Discharge Instruction: Please do not put silicone  bordered dressings under wraps. Use non-bordered dressing only. Secured With Compression Wrap Compression Stockings Facilities manager) Signed: 07/08/2023 11:59:03 AM By: [name] Entered By: Yevonne Pax on 06/27/2023 05:48:55 -------------------------------------------------------------------------------- Vitals Details Patient Name: Date of Service: Christine Christine, [name] J. 06/27/2023 8:45 A M Medical Record Number: [id number] Patient Account Number: [id number] Date of Birth/Sex: Treating RN: 12-05-1945 (77 y.o. [name] Primary Christine Faiza Bansal: [name] Other Clinician: Referring Fergus Throne: Treating Darionna Banke/Extender: [name] in Treatment: 9 Vital Signs Time Taken: 08:44 Temperature (F): 98 Height (in): 68 Pulse (bpm): 70 Weight (lbs): 200 Respiratory Rate (breaths/min): 18 Body Mass Index (BMI): 30.4 Blood Pressure (mmHg): 158/65 Reference Range: 80 - 120 mg / dl Electronic Signature(s) Signed: 07/08/2023 11:59:03 AM By: [name] Entered By: Yevonne Pax on 06/27/2023 05:45:08

## 2023-07-14 ENCOUNTER — Encounter: Payer: Federal, State, Local not specified - PPO | Attending: Physician Assistant | Admitting: Physician Assistant

## 2023-07-14 DIAGNOSIS — I872 Venous insufficiency (chronic) (peripheral): Secondary | ICD-10-CM | POA: Insufficient documentation

## 2023-07-14 DIAGNOSIS — I890 Lymphedema, not elsewhere classified: Secondary | ICD-10-CM | POA: Insufficient documentation

## 2023-07-14 DIAGNOSIS — E11622 Type 2 diabetes mellitus with other skin ulcer: Secondary | ICD-10-CM | POA: Diagnosis not present

## 2023-07-14 DIAGNOSIS — I10 Essential (primary) hypertension: Secondary | ICD-10-CM | POA: Insufficient documentation

## 2023-07-14 DIAGNOSIS — L97822 Non-pressure chronic ulcer of other part of left lower leg with fat layer exposed: Secondary | ICD-10-CM | POA: Insufficient documentation

## 2023-07-14 NOTE — Progress Notes (Addendum)
[name], [name] ([id number]) [id number] Page 1 of 7 Visit Report for 07/14/2023 Chief Complaint Document Details Patient Name: Date of Service: [name], [name] 07/14/2023 12:30 PM Medical Record Number: [id number] Patient Account Number: [id number] Date of Birth/Sex: Treating RN: [birth year] (77 y.o. [name] Primary [name] Provider: [name] Other Clinician: Referring Provider: Treating Provider/Extender: [name] in Treatment: 11 Information Obtained from: Patient Chief Complaint Left LE Ulcer Electronic Signature(s) Signed: 07/14/2023 12:36:51 PM By: Allen Derry PA-C Entered By: Allen Derry on 07/14/2023 09:36:50 -------------------------------------------------------------------------------- HPI Details Patient Name: Date of Service: [name]. 07/14/2023 12:30 PM Medical Record Number: [id number] Patient Account Number: [id number] Date of Birth/Sex: Treating RN: May 21, 1946 (77 y.o. [name] Primary [name] Provider: [name] Other Clinician: Referring Provider: Treating Provider/Extender: [name] in Treatment: 11 History of Present Illness HPI Description: 04-22-2023 upon evaluation patient appears to be doing somewhat poorly in regard to a wound on the left distal/lateral lower extremity and about the ankle region. She tells me this occurred on February 07, 2023 as a result of trying to get a very tight and new compression sock off. It was getting stuck and she somewhat twisted trying to get it off and pulled skin off. Since that time she has been having a hard time getting this to heal. Fortunately there does not appear to be any signs of active infection which is great news. She did have an ABI of 0.75 today. With that being said I am actually decently pleased with how the wound looks it does have some necrotic tissue but there is going to need to be some sharp  debridement to clear away some of the dead tissue as well today. Patient does have a history of diabetes mellitus type 2, lymphedema, hypertension, and chronic venous insufficiency. 05-02-2023 upon evaluation today patient appears to be doing well currently in regard to her wound. She is actually showing signs of improvement and very pleased in that regard and fortunately I do not see any signs of active infection locally or systemically at this time which is great news. No fevers, chills, nausea, vomiting, or diarrhea. 05-09-2023 upon evaluation today patient appears to be doing well currently in regard to her wound is not worse but is also not better. I discussed with her again today that we will get a need to likely compression wrap for her. With that being said she is adamantly opposed to this and wants to get 1 more week given something to try and I recommend to be seen by double layer Tubigrip to see if this can be beneficial. NGHI, SCHIEFER ([id number]) [id number] Page 2 of 7 05-17-2023 upon evaluation today patient appears to be doing well currently in regard to her wound although it still very inflamed on the posterior aspect of her ankle. I discussed that with her today. With that being said I do not see any evidence of general worsening which is great news but also do not see a significant amount of improvement compared to last week in my opinion. I discussed this with her at this point today. 05-23-2023 upon evaluation today patient appears to be doing well currently in regard to her wound. This is actually showing some slough and biofilm on the surface of the wound. The good news is I do believe that we cleaned some of the soften the scar ischial little bit of improvement with that being said the periwound is being very irritated  I think the Vashe may have irritated her based on what she is telling me. Fortunately I do not see any evidence of active infection  locally nor systemically at this time which is great news. No fevers, chills, nausea, vomiting, or diarrhea. 7/22; wound on the left lateral lower leg in the setting of significant chronic venous hypertension. We have been using Prisma. She would not allow a compression wrap although she is using a support stocking. 06-06-2023 upon evaluation today patient's wound actually showing signs of significant improvement with regard to her wound. She has been tolerating the dressing changes without complication. Fortunately there does not appear to be any signs of active infection locally nor systemically at this time which is great news. No fevers, chills, nausea, vomiting, or diarrhea. 06-20-2023 upon evaluation today patient appears to be doing well currently in regard to her wound which is actually showing signs of significant improvement. Fortunately I do not see any evidence of worsening infection I do believe that the patient is moving in the right direction which is great news and in general I think that we are on the right track here. 06-27-2023 upon evaluation today patient appears to be doing well currently in regard to her wound. She has been tolerating the dressing changes without complication and in general I do feel like there were making really good headway towards complete closure. I do not see any signs of active infection locally or systemically at this time. 07-14-2023 upon evaluation today patient appears to be doing well currently in regard to her wound. She has been tolerating the dressing changes without complication. Fortunately I do not see any signs of active infection at this time which is great news. No fevers, chills, nausea, vomiting, or diarrhea. Electronic Signature(s) Signed: 07/14/2023 12:58:36 PM By: Allen Derry PA-C Entered By: Allen Derry on 07/14/2023 09:58:35 -------------------------------------------------------------------------------- Physical Exam Details Patient  Name: Date of Service: [name], [name] 07/14/2023 12:30 PM Medical Record Number: [id number] Patient Account Number: [id number] Date of Birth/Sex: Treating RN: 07-22-1946 (77 y.o. [name] Primary [name] Provider: [name] Other Clinician: Referring Provider: Treating Provider/Extender: [name] in Treatment: 11 Constitutional Well-nourished and well-hydrated in no acute distress. Respiratory normal breathing without difficulty. Psychiatric this patient is able to make decisions and demonstrates good insight into disease process. Alert and Oriented x 3. pleasant and cooperative. Notes Upon inspection patient's wound bed actually showed signs of good granulation and epithelization at this point. Fortunately I do not see any evidence of worsening she does not require any debridement this is still just very slow to heal. Unfortunately it so small even the skin substitutes are really not indicated at this point. Will just have to keep trying to do what we can to get this to heal. Electronic Signature(s) Signed: 07/14/2023 12:59:00 PM By: Allen Derry PA-C Entered By: Allen Derry on 07/14/2023 09:59:00 Ethelle Lyon, [name] ([id number]) [id number] Page 3 of 7 -------------------------------------------------------------------------------- Physician Orders Details Patient Name: Date of Service: [name], [name] 07/14/2023 12:30 PM Medical Record Number: [id number] Patient Account Number: [id number] Date of Birth/Sex: Treating RN: 28-Sep-1946 (77 y.o. [name] Primary [name] Provider: [name] Other Clinician: Referring Provider: Treating Provider/Extender: [name] in Treatment: 11 Verbal / Phone Orders: No Diagnosis Coding ICD-10 Coding Code Description [phone] Chronic venous hypertension (idiopathic) with ulcer and inflammation of left lower extremity E11.622 Type 2 diabetes  mellitus with other skin ulcer I89.0 Lymphedema, not elsewhere classified L97.822  Non-pressure chronic ulcer of other part of left lower leg with fat layer exposed I10 Essential (primary) hypertension Follow-up Appointments Return Appointment in 1 week. Bathing/ Shower/ Hygiene May shower; gently cleanse wound with antibacterial soap, rinse and pat dry prior to dressing wounds Edema Control - Lymphedema / Segmental Compressive Device / Other Patient to wear own compression stockings. Remove compression stockings every night before going to bed and put on every morning when getting up. - bi lat Elevate, Exercise Daily and A void Standing for Long Periods of Time. Elevate legs to the level of the heart and pump ankles as often as possible Elevate leg(s) parallel to the floor when sitting. Wound Treatment Wound #1 - Lower Leg Wound Laterality: Left, Lateral, Distal Cleanser: Byram Ancillary Kit - 15 Day Supply (Generic) 3 x Per Week/30 Days Discharge Instructions: Use supplies as instructed; Kit contains: (15) Saline Bullets; (15) 3x3 Gauze; 15 pr Gloves Prim Dressing: Prisma 4.34 (in) 3 x Per Week/30 Days ary Discharge Instructions: Moisten w/normal saline or sterile water; Cover wound as directed. Do not remove from wound bed. Secondary Dressing: (BORDER) Zetuvit Plus SILICONE BORDER Dressing 4x4 (in/in) 3 x Per Week/30 Days Discharge Instructions: Please do not put silicone bordered dressings under wraps. Use non-bordered dressing only. Electronic Signature(s) Signed: 07/14/2023 1:40:54 PM By: [name] Signed: 07/15/2023 2:02:35 PM By: Allen Derry PA-C Entered By: Yevonne Pax on 07/14/2023 09:55:08 [name] ([id number]) [id number] Page 4 of 7 -------------------------------------------------------------------------------- Problem List Details Patient Name: Date of Service: [name], [name] 07/14/2023 12:30 PM Medical Record Number:  [id number] Patient Account Number: [id number] Date of Birth/Sex: Treating RN: [birth year] (77 y.o. 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 07/14/2023 12:30 PM Medical Record Number: [id number] Patient Account Number: [id number] Date of Birth/Sex: Treating RN: 05-15-1946 (77 y.o. [name], [name], [name] ([id number]) [id number] Page 5 of 7 Primary [name] Provider: [name] Other Clinician: Referring Provider: Treating Provider/Extender: [name] in Treatment: 11 Subjective Chief Complaint Information obtained from Patient Left LE Ulcer History of Present Illness (HPI) 04-22-2023 upon evaluation patient appears to be doing somewhat poorly in regard to a wound on the left distal/lateral lower extremity and about the ankle region. She tells me this occurred on February 07, 2023 as a result of trying to get a very tight and new compression sock off. It was getting stuck and she somewhat twisted trying to  get it off and pulled skin off. Since that time she has been having a hard time getting this to heal. Fortunately there does not appear to be any signs of active infection which is great news. She did have an ABI of 0.75 today. With that being said I am actually decently pleased with how the wound looks it does have some necrotic  tissue but there is going to need to be some sharp debridement to clear away some of the dead tissue as well today. Patient does have a history of diabetes mellitus type 2, lymphedema, hypertension, and chronic venous insufficiency. 05-02-2023 upon evaluation today patient appears to be doing well currently in regard to her wound. She is actually showing signs of improvement and very pleased in that regard and fortunately I do not see any signs of active infection locally or systemically at this time which is great news. No fevers, chills, nausea, vomiting, or diarrhea. 05-09-2023 upon evaluation today patient appears to be doing well currently in regard to her wound is not worse but is also not better. I discussed with her again today that we will get a need to likely compression wrap for her. With that being said she is adamantly opposed to this and wants to get 1 more week given something to try and I recommend to be seen by double layer Tubigrip to see if this can be beneficial. 05-17-2023 upon evaluation today patient appears to be doing well currently in regard to her wound although it still very inflamed on the posterior aspect of her ankle. I discussed that with her today. With that being said I do not see any evidence of general worsening which is great news but also do not see a significant amount of improvement compared to last week in my opinion. I discussed this with her at this point today. 05-23-2023 upon evaluation today patient appears to be doing well currently in regard to her wound. This is actually showing some slough and biofilm on the surface of the wound.  The good news is I do believe that we cleaned some of the soften the scar ischial little bit of improvement with that being said the periwound is being very irritated I think the Vashe may have irritated her based on what she is telling me. Fortunately I do not see any evidence of active infection locally nor systemically at this time which is great news. No fevers, chills, nausea, vomiting, or diarrhea. 7/22; wound on the left lateral lower leg in the setting of significant chronic venous hypertension. We have been using Prisma. She would not allow a compression wrap although she is using a support stocking. 06-06-2023 upon evaluation today patient's wound actually showing signs of significant improvement with regard to her wound. She has been tolerating the dressing changes without complication. Fortunately there does not appear to be any signs of active infection locally nor systemically at this time which is great news. No fevers, chills, nausea, vomiting, or diarrhea. 06-20-2023 upon evaluation today patient appears to be doing well currently in regard to her wound which is actually showing signs of significant improvement. Fortunately I do not see any evidence of worsening infection I do believe that the patient is moving in the right direction which is great news and in general I think that we are on the right track here. 06-27-2023 upon evaluation today patient appears to be doing well currently in regard to her wound. She has been tolerating the dressing changes without complication and in general I do feel like there were making really good headway towards complete closure. I do not see any signs of active infection locally or systemically at this time. 07-14-2023 upon evaluation today patient appears to be doing well currently in regard to her wound. She has been tolerating the dressing changes without complication. Fortunately I do not see any signs of active  infection at this time which is  great news. No fevers, chills, nausea, vomiting, or diarrhea. Objective Constitutional Well-nourished and well-hydrated in no acute distress. Vitals Time Taken: 12:37 PM, Height: 68 in, Weight: 200 lbs, BMI: 30.4, Temperature: 97.8 F, Pulse: 68 bpm, Respiratory Rate: 18 breaths/min, Blood Pressure: 148/63 mmHg. Respiratory normal breathing without difficulty. Psychiatric this patient is able to make decisions and demonstrates good insight into disease process. Alert and Oriented x 3. pleasant and cooperative. General Notes: Upon inspection patient's wound bed actually showed signs of good granulation and epithelization at this point. Fortunately I do not see any evidence of worsening she does not require any debridement this is still just very slow to heal. Unfortunately it so small even the skin substitutes are really not indicated at this point. Will just have to keep trying to do what we can to get this to heal. Integumentary (Hair, Skin) Wound #1 status is Open. Original cause of wound was Trauma. The date acquired was: 02/07/2023. The wound has been in treatment 11 weeks. The wound is located on the Left,Distal,Lateral Lower Leg. The wound measures 0.4cm length x 0.6cm width x 0.2cm depth; 0.188cm^2 area and 0.038cm^3 volume. There is Fat Layer (Subcutaneous Tissue) exposed. There is no tunneling or undermining noted. There is a medium amount of serosanguineous drainage noted. There is large (67-100%) red granulation within the wound bed. There is a small (1-33%) amount of necrotic tissue within the wound bed including Adherent Slough. [name], [name] ([id number]) [id number] Page 6 of 7 Assessment Active Problems ICD-10 Chronic venous hypertension (idiopathic) with ulcer and inflammation of left lower extremity Type 2 diabetes mellitus with other skin ulcer Lymphedema, not elsewhere classified Non-pressure chronic ulcer of other part of left lower leg with  fat layer exposed Essential (primary) hypertension Plan Follow-up Appointments: Return Appointment in 1 week. Bathing/ Shower/ Hygiene: May shower; gently cleanse wound with antibacterial soap, rinse and pat dry prior to dressing wounds Edema Control - Lymphedema / Segmental Compressive Device / Other: Patient to wear own compression stockings. Remove compression stockings every night before going to bed and put on every morning when getting up. - bi lat Elevate, Exercise Daily and Avoid Standing for Long Periods of Time. Elevate legs to the level of the heart and pump ankles as often as possible Elevate leg(s) parallel to the floor when sitting. WOUND #1: - Lower Leg Wound Laterality: Left, Lateral, Distal Cleanser: Byram Ancillary Kit - 15 Day Supply (Generic) 3 x Per Week/30 Days Discharge Instructions: Use supplies as instructed; Kit contains: (15) Saline Bullets; (15) 3x3 Gauze; 15 pr Gloves Prim Dressing: Prisma 4.34 (in) 3 x Per Week/30 Days ary Discharge Instructions: Moisten w/normal saline or sterile water; Cover wound as directed. Do not remove from wound bed. Secondary Dressing: (BORDER) Zetuvit Plus SILICONE BORDER Dressing 4x4 (in/in) 3 x Per Week/30 Days Discharge Instructions: Please do not put silicone bordered dressings under wraps. Use non-bordered dressing only. 1. I would recommend that we have the patient continue to monitor for any signs of infection or worsening. Based on what I am seeing I feel like you are making good headway towards closure. This is just slowly happening. 2. I do not see any signs of infection which is great news. 3. We will continue with the Prisma and moistened this with saline for now the Zetuvit to cover. We will see patient back for reevaluation in 1 week here in the clinic. If anything worsens or changes patient will contact our office  for additional recommendations. Electronic Signature(s) Signed: 07/14/2023 12:59:31 PM By: Allen Derry  PA-C Entered By: Allen Derry on 07/14/2023 09:59:31 -------------------------------------------------------------------------------- SuperBill Details Patient Name: Date of Service: [name], [name] 07/14/2023 Medical Record Number: [id number] Patient Account Number: [id number] Date of Birth/Sex: Treating RN: 12-30-1945 (77 y.o. [name] Primary [name] Provider: [name] Other Clinician: Referring Provider: Treating Provider/Extender: [name] in Treatment: [address] Diagnosis Coding ICD-10 Codes ADIEL, ALPERT ([id number]) [id number] Page 7 of 7 Code Description [phone] Chronic venous hypertension (idiopathic) with ulcer and inflammation of left lower extremity E11.622 Type 2 diabetes mellitus with other skin ulcer I89.0 Lymphedema, not elsewhere classified L97.822 Non-pressure chronic ulcer of other part of left lower leg with fat layer exposed I10 Essential (primary) hypertension Facility Procedures : CPT4 Code: [id number] Description: 81829 - WOUND [name] VISIT-LEV 2 EST PT Modifier: Quantity: 1 Physician Procedures : CPT4 Code Description Modifier [id number] - WC PHYS LEVEL 3 - EST PT ICD-10 Diagnosis Description I87.332 Chronic venous hypertension (idiopathic) with ulcer and inflammation of left lower extremity E11.622 Type 2 diabetes mellitus with other skin  ulcer I89.0 Lymphedema, not elsewhere classified L97.822 Non-pressure chronic ulcer of other part of left lower leg with fat layer exposed Quantity: 1 Electronic Signature(s) Signed: 07/14/2023 1:05:00 PM By: [name] Signed: 07/15/2023 2:02:35 PM By: Allen Derry PA-C Previous Signature: 07/14/2023 12:59:41 PM Version By: Allen Derry PA-C Entered By: Yevonne Pax on 07/14/2023 10:05:00

## 2023-07-14 NOTE — Progress Notes (Addendum)
[name], [name] ([id number]) [id number] Page 1 of 9 Visit Report for 07/14/2023 Arrival Information Details Patient Name: Date of Service: [name], [name] 07/14/2023 12:30 PM Medical Record Number: [id number] Patient Account Number: [id number] Date of Birth/Sex: Treating RN: 18-Jan-1946 (77 y.o. [name] Primary Care Mena Simonis: [name] Other Clinician: Referring Cassidy Tabet: Treating Halla Chopp/Extender: [name] in Treatment: 11 Visit Information History Since Last Visit Added or deleted any medications: No Patient Arrived: Ambulatory Any new allergies or adverse reactions: No Arrival Time: 12:37 Had a fall or experienced change in No Accompanied By: self activities of daily living that may affect Transfer Assistance: None risk of falls: Patient Identification Verified: Yes Signs or symptoms of abuse/neglect since last visito No Secondary Verification Process Completed: Yes Hospitalized since last visit: No Patient Requires Transmission-Based Precautions: No Implantable device outside of the clinic excluding No Patient Has Alerts: No cellular tissue based products placed in the center since last visit: Has Dressing in Place as Prescribed: Yes Has Compression in Place as Prescribed: Yes Pain Present Now: No Electronic Signature(s) Signed: 07/14/2023 1:40:54 PM By: Christine Pax RN Entered By: [name] on 07/14/2023 12:37:54 -------------------------------------------------------------------------------- Clinic Level of Care Assessment Details Patient Name: Date of Service: [name], [name] 07/14/2023 12:30 PM Medical Record Number: [id number] Patient Account Number: [id number] Date of Birth/Sex: Treating RN: 10-10-1946 (77 y.o. [name] Primary Care Aric Jost: [name] Other Clinician: Referring Shellby Schlink: Treating Saffron Busey/Extender: [name] in Treatment:  11 Clinic Level of Care Assessment Items TOOL 4 Quantity Score X- 1 0 Use when only an EandM is performed on FOLLOW-UP visit ASSESSMENTS - Nursing Assessment / Reassessment X- 1 10 Reassessment of Co-morbidities (includes updates in patient status) X- 1 5 Reassessment of Adherence to Treatment Plan [name], [name] ([id number]) [id number] Page 2 of 9 ASSESSMENTS - Wound and Skin A ssessment / Reassessment X - Simple Wound Assessment / Reassessment - one wound 1 5 []  - 0 Complex Wound Assessment / Reassessment - multiple wounds []  - 0 Dermatologic / Skin Assessment (not related to wound area) ASSESSMENTS - Focused Assessment []  - 0 Circumferential Edema Measurements - multi extremities []  - 0 Nutritional Assessment / Counseling / Intervention []  - 0 Lower Extremity Assessment (monofilament, tuning fork, pulses) []  - 0 Peripheral Arterial Disease Assessment (using hand held doppler) ASSESSMENTS - Ostomy and/or Continence Assessment and Care []  - 0 Incontinence Assessment and Management []  - 0 Ostomy Care Assessment and Management (repouching, etc.) PROCESS - Coordination of Care X - Simple Patient / Family Education for ongoing care 1 15 []  - 0 Complex (extensive) Patient / Family Education for ongoing care []  - 0 Staff obtains Chiropractor, Records, T Results / Process Orders est []  - 0 Staff telephones HHA, Nursing Homes / Clarify orders / etc []  - 0 Routine Transfer to another Facility (non-emergent condition) []  - 0 Routine Hospital Admission (non-emergent condition) []  - 0 New Admissions / Manufacturing engineer / Ordering NPWT Apligraf, etc. , []  - 0 Emergency Hospital Admission (emergent condition) X- 1 10 Simple Discharge Coordination []  - 0 Complex (extensive) Discharge Coordination PROCESS - Special Needs []  - 0 Pediatric / Minor Patient Management []  - 0 Isolation Patient Management []  - 0 Hearing / Language / Visual special  needs []  - 0 Assessment of Community assistance (transportation, D/C planning, etc.) []  - 0 Additional assistance / Altered mentation []  - 0 Support Surface(s) Assessment (bed, cushion, seat, etc.) INTERVENTIONS - Wound Cleansing /  Measurement X - Simple Wound Cleansing - one wound 1 5 []  - 0 Complex Wound Cleansing - multiple wounds X- 1 5 Wound Imaging (photographs - any number of wounds) []  - 0 Wound Tracing (instead of photographs) X- 1 5 Simple Wound Measurement - one wound []  - 0 Complex Wound Measurement - multiple wounds INTERVENTIONS - Wound Dressings X - Small Wound Dressing one or multiple wounds 1 10 []  - 0 Medium Wound Dressing one or multiple wounds []  - 0 Large Wound Dressing one or multiple wounds []  - 0 Application of Medications - topical []  - 0 Application of Medications - injection INTERVENTIONS - Miscellaneous []  - 0 External ear exam [name], [name] ([id number]) [id number] Page 3 of 9 []  - 0 Specimen Collection (cultures, biopsies, blood, body fluids, etc.) []  - 0 Specimen(s) / Culture(s) sent or taken to Lab for analysis []  - 0 Patient Transfer (multiple staff / Michiel Sites Lift / Similar devices) []  - 0 Simple Staple / Suture removal (25 or less) []  - 0 Complex Staple / Suture removal (26 or more) []  - 0 Hypo / Hyperglycemic Management (close monitor of Blood Glucose) []  - 0 Ankle / Brachial Index (ABI) - do not check if billed separately X- 1 5 Vital Signs Has the patient been seen at the hospital within the last three years: Yes Total Score: 75 Level Of Care: New/Established - Level 2 Electronic Signature(s) Signed: 07/14/2023 1:40:54 PM By: Christine Pax RN Entered By: [name] on 07/14/2023 13:03:55 -------------------------------------------------------------------------------- Encounter Discharge Information Details Patient Name: Date of Service: [name], [name] 07/14/2023 12:30 PM Medical Record Number:  [id number] Patient Account Number: [id number] Date of Birth/Sex: Treating RN: [birth year] (77 y.o. [name] Primary Care Zyann Mabry: [name] Other Clinician: Referring Willodene Stallings: Treating Karmin Kasprzak/Extender: [name] in Treatment: 11 Encounter Discharge Information Items Discharge Condition: Stable Ambulatory Status: Ambulatory Discharge Destination: Home Transportation: Private Auto Accompanied By: self Schedule Follow-up Appointment: Yes Clinical Summary of Care: Electronic Signature(s) Signed: 07/14/2023 1:05:34 PM By: Christine Pax RN Entered By: [name] on 07/14/2023 13:05:33 Lower Extremity Assessment Details -------------------------------------------------------------------------------- Gayla Doss ([id number]) [id number] Page 4 of 9 Patient Name: Date of Service: KORIANA, WOLNY 07/14/2023 12:30 PM Medical Record Number: [id number] Patient Account Number: [id number] Date of Birth/Sex: Treating RN: 06/07/1946 (77 y.o. [name] Primary Care Dawon Troop: [name] Other Clinician: Referring Zanae Kuehnle: Treating Telissa Palmisano/Extender: [name] in Treatment: 11 Edema Assessment Left: Right: Assessed: No No Edema: Yes Calf Left: Right: Point of Measurement: 35 cm From Medial Instep 37 cm Ankle Left: Right: Point of Measurement: 10 cm From Medial Instep 24 cm Vascular Assessment Left: Right: Pulses: Dorsalis Pedis Palpable: Yes Extremity colors, hair growth, and conditions: Extremity Color: Hyperpigmented Hair Growth on Extremity: No Temperature of Extremity: Warm Capillary Refill: < 3 seconds Dependent Rubor: No Blanched when Elevated: No Lipodermatosclerosis: No Toe Nail Assessment Left: Right: Thick: No Discolored: No Deformed: No Improper Length and Hygiene: No Electronic Signature(s) Signed: 07/14/2023 1:40:54 PM By: Christine Pax RN Entered By:  [name] on 07/14/2023 12:44:30 -------------------------------------------------------------------------------- Multi Wound Chart Details Patient Name: Date of Service: [name], [name] 07/14/2023 12:30 PM Medical Record Number: [id number] Patient Account Number: [id number] Date of Birth/Sex: Treating RN: [birth year] (77 y.o. [name] Primary Care Dray Dente: [name] Other Clinician: Referring Klaudia Beirne: Treating Clara Smolen/Extender: [name] in Treatment: 11 Vital Signs Height(in): 68 Pulse(bpm): 68 Weight(lbs): 200 Blood Pressure(mmHg): 148/63 Body Mass  Index(BMI): 30.4 Temperature(F): 97.8 GOLIE, SIMONI J ([id number]) [id number] Page 5 of 9 Respiratory Rate(breaths/min): 18 [1:Photos:] [N/A:N/A] Left, Distal, Lateral Lower Leg N/A N/A Wound Location: Trauma N/A N/A Wounding Event: Diabetic Wound/Ulcer of the Lower N/A N/A Primary Etiology: Extremity Lymphedema, Hypertension, Type II N/A N/A Comorbid History: Diabetes 02/07/2023 N/A N/A Date Acquired: 11 N/A N/A Weeks of Treatment: Open N/A N/A Wound Status: No N/A N/A Wound Recurrence: 0.4x0.6x0.2 N/A N/A Measurements L x W x D (cm) 0.188 N/A N/A A (cm) : rea 0.038 N/A N/A Volume (cm) : 46.70% N/A N/A % Reduction in A rea: 64.20% N/A N/A % Reduction in Volume: Grade 1 N/A N/A Classification: Medium N/A N/A Exudate A mount: Serosanguineous N/A N/A Exudate Type: red, brown N/A N/A Exudate Color: Large (67-100%) N/A N/A Granulation A mount: Red N/A N/A Granulation Quality: Small (1-33%) N/A N/A Necrotic A mount: Fat Layer (Subcutaneous Tissue): Yes N/A N/A Exposed Structures: Fascia: No Tendon: No Muscle: No Joint: No Bone: No None N/A N/A Epithelialization: Treatment Notes Electronic Signature(s) Signed: 07/14/2023 1:40:54 PM By: Christine Pax RN Entered By: [name] on 07/14/2023  12:44:34 -------------------------------------------------------------------------------- Multi-Disciplinary Care Plan Details Patient Name: Date of Service: [name], [name] 07/14/2023 12:30 PM Medical Record Number: [id number] Patient Account Number: [id number] Date of Birth/Sex: Treating RN: 1945/11/14 (77 y.o. [name] Primary Care Ilyse Tremain: [name] Other Clinician: Referring Bart Ashford: Treating Vincenzo Stave/Extender: [name] in Treatment: [address] RALEY, KALLER ([id number]) [id number] Page 6 of 9 Nursing Diagnoses: Knowledge deficit related to ulceration/compromised skin integrity Goals: Patient/caregiver will verbalize understanding of skin care regimen Date Initiated: 04/22/2023 Target Resolution Date: 08/22/2023 Goal Status: Active Ulcer/skin breakdown will have a volume reduction of 30% by week 4 Date Initiated: 04/22/2023 Date Inactivated: 06/27/2023 Target Resolution Date: 05/22/2023 Goal Status: Unmet Unmet Reason: comorbidities Ulcer/skin breakdown will have a volume reduction of 50% by week 8 Date Initiated: 04/22/2023 Date Inactivated: 06/27/2023 Target Resolution Date: 06/22/2023 Goal Status: Unmet Unmet Reason: comorbidities Ulcer/skin breakdown will have a volume reduction of 80% by week 12 Date Initiated: 04/22/2023 Target Resolution Date: 07/23/2023 Goal Status: Active Ulcer/skin breakdown will heal within 14 weeks Date Initiated: 04/22/2023 Target Resolution Date: 08/22/2023 Goal Status: Active Interventions: Assess patient/caregiver ability to obtain necessary supplies Assess patient/caregiver ability to perform ulcer/skin care regimen upon admission and as needed Assess ulceration(s) every visit Notes: Electronic Signature(s) Signed: 07/14/2023 1:40:54 PM By: Christine Pax RN Entered By: [name] on 07/14/2023  12:44:49 -------------------------------------------------------------------------------- Pain Assessment Details Patient Name: Date of Service: KHIYA, WESTHOFF 07/14/2023 12:30 PM Medical Record Number: [id number] Patient Account Number: [id number] Date of Birth/Sex: Treating RN: [birth year] (77 y.o. [name] Primary Care Raivyn Kabler: [name] Other Clinician: Referring Cedar Roseman: Treating Ahyan Kreeger/Extender: [name] in Treatment: 11 Active Problems Location of Pain Severity and Description of Pain Patient Has Paino No Site Locations Babb, [place] J ([id number]) [id number] Page 7 of 9 Pain Management and Medication Current Pain Management: Electronic Signature(s) Signed: 07/14/2023 1:40:54 PM By: Christine Pax RN Entered By: [name] on 07/14/2023 12:38:30 -------------------------------------------------------------------------------- Patient/Caregiver Education Details Patient Name: Date of Service: [name] 9/5/2024andnbsp12:30 PM Medical Record Number: [id number] Patient Account Number: [id number] Date of Birth/Gender: Treating RN: 1945-12-27 (77 y.o. [name] Primary Care Physician: [name] Other Clinician: Referring Physician: Treating Physician/Extender: [name] in Treatment: 11 Education Assessment Education Provided To: Patient Education Topics Provided Wound/Skin Impairment: Handouts: Caring for Your  Ulcer Methods: Explain/Verbal Responses: State content correctly Electronic Signature(s) Signed: 07/14/2023 1:40:54 PM By: Christine Pax RN Entered By: [name] on 07/14/2023 12:45:03 Gayla Doss ([id number]) [id number] Page 8 of 9 -------------------------------------------------------------------------------- Wound Assessment Details Patient Name: Date of Service: LAGUITA, BROMBERG 07/14/2023 12:30  PM Medical Record Number: [id number] Patient Account Number: [id number] Date of Birth/Sex: Treating RN: 08-Jun-1946 (77 y.o. [name] Primary Care Xaviar Lunn: [name] Other Clinician: Referring Careem Yasui: Treating Nehal Shives/Extender: [name] in Treatment: 11 Wound Status Wound Number: 1 Primary Etiology: Diabetic Wound/Ulcer of the Lower Extremity Wound Location: Left, Distal, Lateral Lower Leg Wound Status: Open Wounding Event: Trauma Comorbid History: Lymphedema, Hypertension, Type II Diabetes Date Acquired: 02/07/2023 Weeks Of Treatment: 11 Clustered Wound: No Photos Wound Measurements Length: (cm) 0.4 Width: (cm) 0.6 Depth: (cm) 0.2 Area: (cm) 0.188 Volume: (cm) 0.038 % Reduction in Area: 46.7% % Reduction in Volume: 64.2% Epithelialization: None Tunneling: No Undermining: No Wound Description Classification: Grade 1 Exudate Amount: Medium Exudate Type: Serosanguineous Exudate Color: red, brown Foul Odor After Cleansing: No Slough/Fibrino Yes Wound Bed Granulation Amount: Large (67-100%) Exposed Structure Granulation Quality: Red Fascia Exposed: No Necrotic Amount: Small (1-33%) Fat Layer (Subcutaneous Tissue) Exposed: Yes Necrotic Quality: Adherent Slough Tendon Exposed: No Muscle Exposed: No Joint Exposed: No Bone Exposed: No Treatment Notes Wound #1 (Lower Leg) Wound Laterality: Left, Lateral, Distal Cleanser [place] Ancillary Kit - 15 Day Supply Discharge Instruction: Use supplies as instructed; Kit contains: (15) Saline Bullets; (15) 3x3 Gauze; 15 pr Gloves Peri-Wound Care ADEEBA, DUSKEY J ([id number]) [id number] Page 9 of 9 Topical Primary Dressing Prisma 4.34 (in) Discharge Instruction: Moisten w/normal saline or sterile water; Cover wound as directed. Do not remove from wound bed. Secondary Dressing (BORDER) Zetuvit Plus SILICONE BORDER Dressing 4x4 (in/in) Discharge Instruction:  Please do not put silicone bordered dressings under wraps. Use non-bordered dressing only. Secured With Compression Wrap Compression Stockings Facilities manager) Signed: 07/14/2023 1:40:54 PM By: Christine Pax RN Entered By: [name] on 07/14/2023 12:43:26 -------------------------------------------------------------------------------- Vitals Details Patient Name: Date of Service: [name], [name] 07/14/2023 12:30 PM Medical Record Number: [id number] Patient Account Number: [id number] Date of Birth/Sex: Treating RN: Jan 08, 1946 (77 y.o. [name] Primary Care Mckinsey Keagle: [name] Other Clinician: Referring Tifani Dack: Treating Elly Haffey/Extender: [name] in Treatment: 11 Vital Signs Time Taken: 12:37 Temperature (F): 97.8 Height (in): 68 Pulse (bpm): 68 Weight (lbs): 200 Respiratory Rate (breaths/min): 18 Body Mass Index (BMI): 30.4 Blood Pressure (mmHg): 148/63 Reference Range: 80 - 120 mg / dl Electronic Signature(s) Signed: 07/14/2023 1:40:54 PM By: Christine Pax RN Entered By: [name] on 07/14/2023 12:38:14

## 2023-07-19 ENCOUNTER — Encounter: Payer: Federal, State, Local not specified - PPO | Admitting: Physician Assistant

## 2023-07-19 DIAGNOSIS — E11622 Type 2 diabetes mellitus with other skin ulcer: Secondary | ICD-10-CM | POA: Diagnosis not present

## 2023-07-22 NOTE — Progress Notes (Signed)
here in the clinic. If anything worsens or changes patient will contact our office for additional recommendations. Electronic Signature(s) Signed: 07/19/2023  10:04:02 AM By: Allen Derry PA-C Entered By: Allen Derry on 07/19/2023 10:04:02 -------------------------------------------------------------------------------- SuperBill Details Patient Name: Date of Service: [name], [name] 07/19/2023 Medical Record Number: [id number] Patient Account Number: [id number] Date of Birth/Sex: Treating RN: [birth year] (77 y.o. [name] Primary [name] Provider: [name] Other Clinician: Referring Provider: Treating Provider/Extender: [name] in Treatment: 12 Diagnosis Coding ICD-10 Codes Code Description [phone] Chronic venous hypertension (idiopathic) with ulcer and inflammation of left lower extremity E11.622 Type 2 diabetes mellitus with other skin ulcer I89.0 Lymphedema, not elsewhere classified L97.822 Non-pressure chronic ulcer of other part of left lower leg with fat layer exposed I10 Essential (primary) hypertension Facility Procedures : CPT4 Code: 32440102 Description: 72536 - WOUND [name] VISIT-LEV 2 EST PT Modifier: Quantity: 1 Physician Procedures : CPT4 Code Description Modifier [id number] - WC PHYS LEVEL 3 - EST PT ICD-10 Diagnosis Description SARAIAH, BRAYE ([id number]) [id number] Page [phone] Chronic venous hypertension (idiopathic) with ulcer and inflammation  of left lower extremity E11.622 Type 2 diabetes mellitus with other skin ulcer I89.0 Lymphedema, not elsewhere classified L97.822 Non-pressure chronic ulcer of other part of left lower leg with fat layer exposed Quantity: 1 7 of 7 Electronic Signature(s) Signed: 07/19/2023 10:04:15 AM By: Allen Derry PA-C Entered By: Allen Derry on 07/19/2023 10:04:15  here in the clinic. If anything worsens or changes patient will contact our office for additional recommendations. Electronic Signature(s) Signed: 07/19/2023  10:04:02 AM By: Allen Derry PA-C Entered By: Allen Derry on 07/19/2023 10:04:02 -------------------------------------------------------------------------------- SuperBill Details Patient Name: Date of Service: [name], [name] 07/19/2023 Medical Record Number: [id number] Patient Account Number: [id number] Date of Birth/Sex: Treating RN: [birth year] (77 y.o. [name] Primary [name] Provider: [name] Other Clinician: Referring Provider: Treating Provider/Extender: [name] in Treatment: 12 Diagnosis Coding ICD-10 Codes Code Description [phone] Chronic venous hypertension (idiopathic) with ulcer and inflammation of left lower extremity E11.622 Type 2 diabetes mellitus with other skin ulcer I89.0 Lymphedema, not elsewhere classified L97.822 Non-pressure chronic ulcer of other part of left lower leg with fat layer exposed I10 Essential (primary) hypertension Facility Procedures : CPT4 Code: 32440102 Description: 72536 - WOUND [name] VISIT-LEV 2 EST PT Modifier: Quantity: 1 Physician Procedures : CPT4 Code Description Modifier [id number] 99213 - WC PHYS LEVEL 3 - EST PT ICD-10 Diagnosis Description SARAIAH, BRAYE ([id number]) [id number] Page [phone] Chronic venous hypertension (idiopathic) with ulcer and inflammation  of left lower extremity E11.622 Type 2 diabetes mellitus with other skin ulcer I89.0 Lymphedema, not elsewhere classified L97.822 Non-pressure chronic ulcer of other part of left lower leg with fat layer exposed Quantity: 1 7 of 7 Electronic Signature(s) Signed: 07/19/2023 10:04:15 AM By: Allen Derry PA-C Entered By: Allen Derry on 07/19/2023 10:04:15  Reece Packer in Treatment: 12 Verbal / Phone Orders: No Diagnosis Coding Follow-up Appointments Return Appointment in 1 week. Bathing/ Shower/ Hygiene May shower; gently cleanse wound with antibacterial soap, rinse and pat dry prior to dressing wounds Edema Control - Lymphedema / Segmental Compressive Device / Other Patient to wear own compression stockings. Remove compression stockings every night before going to bed and put on every morning when getting up. - bi lat Elevate, Exercise Daily and A void Standing for Long Periods of Time. Elevate legs to the level of the heart and pump ankles as often as possible Elevate leg(s) parallel to the floor when sitting. Wound Treatment Wound #1 - Lower Leg Wound Laterality: Left, Lateral, Distal Cleanser: Byram Ancillary Kit - 15 Day Supply (Generic) 3 x Per Week/30 Days Discharge Instructions: Use supplies as instructed; Kit contains: (15) Saline Bullets; (15) 3x3 Gauze; 15 pr Gloves Prim Dressing: Xeroform-HBD 2x2 (in/in) 3 x Per Week/30 Days ary Discharge Instructions: Apply Xeroform-HBD 2x2 (in/in) as directed Secondary Dressing: (BORDER) Zetuvit Plus SILICONE BORDER Dressing 4x4 (in/in) 3 x Per Week/30 Days Discharge Instructions: Please do not put silicone bordered dressings under wraps. Use non-bordered dressing only. Electronic Signature(s) Signed: 07/21/2023 4:31:22 PM By: Allen Derry PA-C Signed: 07/22/2023 1:15:19 PM By: [name] Entered By: Yevonne Pax on 07/19/2023 09:30:02 -------------------------------------------------------------------------------- Problem List Details Patient Name: Date of Service: [name], [name]. 07/19/2023 8:45 A M Medical Record Number: [id number] Patient Account Number: [id number] Date of Birth/Sex: Treating RN: 1946-05-21 (77 y.o. [name] Primary [name] Provider: [name] Other Clinician: Referring Provider: Treating Provider/Extender: [name] in  Treatment: [address] [place] ([id number]) [id number] Page 4 of 7 Active Problems ICD-10 Encounter Code Description Active Date MDM Diagnosis I87.332 Chronic venous hypertension (idiopathic) with ulcer and inflammation of left 04/22/2023 No Yes lower extremity E11.622 Type 2 diabetes mellitus with other skin ulcer 04/22/2023 No Yes I89.0 Lymphedema, not elsewhere classified 04/22/2023 No Yes L97.822 Non-pressure chronic ulcer of other part of left lower leg with fat layer exposed6/14/2024 No Yes I10 Essential (primary) hypertension 04/22/2023 No Yes Inactive Problems Resolved Problems Electronic Signature(s) Signed: 07/19/2023 10:01:34 AM By: Allen Derry PA-C Entered By: Allen Derry on 07/19/2023 10:01:34 -------------------------------------------------------------------------------- Progress Note Details Patient Name: Date of Service: [name] 07/19/2023 8:45 A M Medical Record Number: [id number] Patient Account Number: [id number] Date of Birth/Sex: Treating RN: 10-06-1946 (77 y.o. [name] Primary [name] Provider: [name] Other Clinician: Referring Provider: Treating Provider/Extender: [name] in Treatment: 12 Subjective Chief Complaint Information obtained from Patient Left LE Ulcer History of Present Illness (HPI) 04-22-2023 upon evaluation patient appears to be doing somewhat poorly in regard to a wound on the left distal/lateral lower extremity and about the ankle region. She tells me this occurred on February 07, 2023 as a result of trying to get a very tight and new compression sock off. It was getting stuck and she somewhat twisted trying to get it off and pulled skin off. Since that time she has been having a hard time getting this to heal. Fortunately there does not appear to be any signs of active infection which is great news. She did have an ABI of 0.75 today. With that being said I am  actually decently pleased with how the wound looks it does have some necrotic tissue but there is going to need to be some sharp debridement to clear away some of the dead tissue as well today.  here in the clinic. If anything worsens or changes patient will contact our office for additional recommendations. Electronic Signature(s) Signed: 07/19/2023  10:04:02 AM By: Allen Derry PA-C Entered By: Allen Derry on 07/19/2023 10:04:02 -------------------------------------------------------------------------------- SuperBill Details Patient Name: Date of Service: [name], [name] 07/19/2023 Medical Record Number: [id number] Patient Account Number: [id number] Date of Birth/Sex: Treating RN: [birth year] (77 y.o. [name] Primary [name] Provider: [name] Other Clinician: Referring Provider: Treating Provider/Extender: [name] in Treatment: 12 Diagnosis Coding ICD-10 Codes Code Description [phone] Chronic venous hypertension (idiopathic) with ulcer and inflammation of left lower extremity E11.622 Type 2 diabetes mellitus with other skin ulcer I89.0 Lymphedema, not elsewhere classified L97.822 Non-pressure chronic ulcer of other part of left lower leg with fat layer exposed I10 Essential (primary) hypertension Facility Procedures : CPT4 Code: 32440102 Description: 72536 - WOUND [name] VISIT-LEV 2 EST PT Modifier: Quantity: 1 Physician Procedures : CPT4 Code Description Modifier [id number] 99213 - WC PHYS LEVEL 3 - EST PT ICD-10 Diagnosis Description SARAIAH, BRAYE ([id number]) [id number] Page [phone] Chronic venous hypertension (idiopathic) with ulcer and inflammation  of left lower extremity E11.622 Type 2 diabetes mellitus with other skin ulcer I89.0 Lymphedema, not elsewhere classified L97.822 Non-pressure chronic ulcer of other part of left lower leg with fat layer exposed Quantity: 1 7 of 7 Electronic Signature(s) Signed: 07/19/2023 10:04:15 AM By: Allen Derry PA-C Entered By: Allen Derry on 07/19/2023 10:04:15  here in the clinic. If anything worsens or changes patient will contact our office for additional recommendations. Electronic Signature(s) Signed: 07/19/2023  10:04:02 AM By: Allen Derry PA-C Entered By: Allen Derry on 07/19/2023 10:04:02 -------------------------------------------------------------------------------- SuperBill Details Patient Name: Date of Service: [name], [name] 07/19/2023 Medical Record Number: [id number] Patient Account Number: [id number] Date of Birth/Sex: Treating RN: [birth year] (77 y.o. [name] Primary [name] Provider: [name] Other Clinician: Referring Provider: Treating Provider/Extender: [name] in Treatment: 12 Diagnosis Coding ICD-10 Codes Code Description [phone] Chronic venous hypertension (idiopathic) with ulcer and inflammation of left lower extremity E11.622 Type 2 diabetes mellitus with other skin ulcer I89.0 Lymphedema, not elsewhere classified L97.822 Non-pressure chronic ulcer of other part of left lower leg with fat layer exposed I10 Essential (primary) hypertension Facility Procedures : CPT4 Code: 32440102 Description: 72536 - WOUND [name] VISIT-LEV 2 EST PT Modifier: Quantity: 1 Physician Procedures : CPT4 Code Description Modifier [id number] 99213 - WC PHYS LEVEL 3 - EST PT ICD-10 Diagnosis Description SARAIAH, BRAYE ([id number]) [id number] Page [phone] Chronic venous hypertension (idiopathic) with ulcer and inflammation  of left lower extremity E11.622 Type 2 diabetes mellitus with other skin ulcer I89.0 Lymphedema, not elsewhere classified L97.822 Non-pressure chronic ulcer of other part of left lower leg with fat layer exposed Quantity: 1 7 of 7 Electronic Signature(s) Signed: 07/19/2023 10:04:15 AM By: Allen Derry PA-C Entered By: Allen Derry on 07/19/2023 10:04:15  here in the clinic. If anything worsens or changes patient will contact our office for additional recommendations. Electronic Signature(s) Signed: 07/19/2023  10:04:02 AM By: Allen Derry PA-C Entered By: Allen Derry on 07/19/2023 10:04:02 -------------------------------------------------------------------------------- SuperBill Details Patient Name: Date of Service: [name], [name] 07/19/2023 Medical Record Number: [id number] Patient Account Number: [id number] Date of Birth/Sex: Treating RN: [birth year] (77 y.o. [name] Primary [name] Provider: [name] Other Clinician: Referring Provider: Treating Provider/Extender: [name] in Treatment: 12 Diagnosis Coding ICD-10 Codes Code Description [phone] Chronic venous hypertension (idiopathic) with ulcer and inflammation of left lower extremity E11.622 Type 2 diabetes mellitus with other skin ulcer I89.0 Lymphedema, not elsewhere classified L97.822 Non-pressure chronic ulcer of other part of left lower leg with fat layer exposed I10 Essential (primary) hypertension Facility Procedures : CPT4 Code: 32440102 Description: 72536 - WOUND [name] VISIT-LEV 2 EST PT Modifier: Quantity: 1 Physician Procedures : CPT4 Code Description Modifier [id number] 99213 - WC PHYS LEVEL 3 - EST PT ICD-10 Diagnosis Description SARAIAH, BRAYE ([id number]) [id number] Page [phone] Chronic venous hypertension (idiopathic) with ulcer and inflammation  of left lower extremity E11.622 Type 2 diabetes mellitus with other skin ulcer I89.0 Lymphedema, not elsewhere classified L97.822 Non-pressure chronic ulcer of other part of left lower leg with fat layer exposed Quantity: 1 7 of 7 Electronic Signature(s) Signed: 07/19/2023 10:04:15 AM By: Allen Derry PA-C Entered By: Allen Derry on 07/19/2023 10:04:15

## 2023-07-22 NOTE — Progress Notes (Signed)
Cleansing - one wound 1 5 []  - 0 Complex Wound Cleansing - multiple wounds X- 1 5 Wound Imaging (photographs - any number of wounds) []  - 0 Wound Tracing (instead of photographs) X- 1 5 Simple Wound Measurement - one wound []  - 0 Complex Wound Measurement - multiple wounds INTERVENTIONS - Wound Dressings X - Small Wound Dressing one or multiple wounds 1 10 []  - 0 Medium Wound Dressing one or multiple wounds []  - 0 Large Wound Dressing one or multiple wounds []  - 0 Application of Medications - topical []  - 0 Application of Medications - injection INTERVENTIONS - Miscellaneous []  - 0 External ear exam BAMA, ZEOLI ([id number]) [id number] Page 3 of 9 []  - 0 Specimen Collection (cultures, biopsies, blood, body fluids, etc.) []  - 0 Specimen(s) / Culture(s) sent or taken to Lab for analysis []  - 0 Patient Transfer (multiple staff / Michiel Sites Lift / Similar devices) []  - 0 Simple Staple / Suture removal (25 or less) []  - 0 Complex Staple / Suture removal (26 or more) []  - 0 Hypo / Hyperglycemic Management (close monitor of Blood Glucose) []  - 0 Ankle / Brachial Index (ABI) - do not check if billed separately X- 1 5 Vital Signs Has the patient been seen at the hospital within the last three years: Yes Total Score: 75 Level Of [name]: New/Established - Level 2 Electronic Signature(s) Signed: 07/22/2023 1:15:19 PM By: [name] Entered By: Yevonne Pax on 07/19/2023 09:30:30 -------------------------------------------------------------------------------- Encounter Discharge Information Details Patient Name: Date of Service: [name], [name] J. 07/19/2023 8:45 A M Medical Record Number: [id number] Patient Account  Number: [id number] Date of Birth/Sex: Treating RN: 1945-11-15 (77 y.o. [name] Primary [name] Terrica Duecker: [name] Other Clinician: Referring Nautia Lem: Treating Mackenize Delgadillo/Extender: [name] in Treatment: 12 Encounter Discharge Information Items Discharge Condition: Stable Ambulatory Status: Ambulatory Discharge Destination: Home Transportation: Private Auto Accompanied By: self Schedule Follow-up Appointment: Yes Clinical Summary of [name]: Electronic Signature(s) Signed: 07/22/2023 1:15:19 PM By: [name] Entered By: Yevonne Pax on 07/19/2023 09:31:11 -------------------------------------------------------------------------------- Lower Extremity Assessment Details Patient Name: Date of Service: BRITTINAY, SHERRICK 07/19/2023 8:45 A BETTYMAE, SCHLAFF ([id number]) [phone].pdf Page 4 of 9 Medical Record Number: [id number] Patient Account Number: [id number] Date of Birth/Sex: Treating RN: 08/15/1946 (77 y.o. [name] Primary [name] [name]: [name] Other Clinician: Referring Jalynn Waddell: Treating [name]/Extender: [name] in Treatment: 12 Edema Assessment Assessed: [name]: No] [name]: No] Edema: [Left: Ye] [Right: s] Calf Left: Right: Point of Measurement: 35 cm From Medial Instep 37 cm Ankle Left: Right: Point of Measurement: 10 cm From Medial Instep 24 cm Vascular Assessment Pulses: Dorsalis Pedis Palpable: [Left:Yes] Extremity colors, hair growth, and conditions: Extremity Color: [Left:Hyperpigmented] Hair Growth on Extremity: [Left:No] Temperature of Extremity: [Left:Warm] Capillary Refill: [Left:< 3 seconds] Dependent Rubor: [Left:No] Blanched when Elevated: [Left:No No] Toe Nail Assessment Left: Right: Thick: No Discolored: No Deformed: No Improper Length and Hygiene: No Electronic Signature(s) Signed: 07/22/2023 1:15:19 PM By: Yevonne Pax  RN Entered By: Yevonne Pax on 07/19/2023 09:05:44 -------------------------------------------------------------------------------- Multi Wound Chart Details Patient Name: Date of Service: [name], [name] J. 07/19/2023 8:45 A M Medical Record Number: [id number] Patient Account Number: [id number] Date of Birth/Sex: Treating RN: May 30, 1946 (77 y.o. [name] Primary [name] Anamari Galeas: [name] Other Clinician: Referring Chardai Gangemi: Treating Sera Hitsman/Extender: [name] in Treatment: 12 Vital Signs Height(in): 68 Pulse(bpm): 74 Weight(lbs): 200 Blood Pressure(mmHg): 177/74 Body Mass Index(BMI): 30.4  Your Ulcer Methods: Explain/Verbal Responses: State content correctly Electronic Signature(s) Signed: 07/22/2023 1:15:19 PM By: [name] Entered By: Yevonne Pax on 07/19/2023 09:06:19 -------------------------------------------------------------------------------- Wound Assessment Details Patient Name: Date of Service: Christine, [name] 07/19/2023 8:45 A M Medical Record Number: [id number] Patient Account Number: [id number] Date of  Birth/Sex: Treating RN: 01-Mar-1946 (77 y.o. [name] Primary [name] Audelia Knape: [name] Other Clinician: Referring Jabar Krysiak: Treating Dulcey Riederer/Extender: [name] in Treatment: [address] Christine, [name] ([id number]) [id number] Page 8 of 9 Wound Number: 1 Primary Etiology: Diabetic Wound/Ulcer of the Lower Extremity Wound Location: Left, Distal, Lateral Lower Leg Wound Status: Open Wounding Event: Trauma Comorbid History: Lymphedema, Hypertension, Type II Diabetes Date Acquired: 02/07/2023 Weeks Of Treatment: 12 Clustered Wound: No Photos Wound Measurements Length: (cm) 0.4 Width: (cm) 0.5 Depth: (cm) 0.2 Area: (cm) 0.157 Volume: (cm) 0.031 % Reduction in Area: 55.5% % Reduction in Volume: 70.8% Epithelialization: None Tunneling: No Undermining: No Wound Description Classification: Grade 1 Exudate Amount: Medium Exudate Type: Serosanguineous Exudate Color: red, brown Foul Odor After Cleansing: No Slough/Fibrino Yes Wound Bed Granulation Amount: Large (67-100%) Exposed Structure Granulation Quality: Red Fascia Exposed: No Necrotic Amount: Small (1-33%) Fat Layer (Subcutaneous Tissue) Exposed: Yes Necrotic Quality: Adherent Slough Tendon Exposed: No Muscle Exposed: No Joint Exposed: No Bone Exposed: No Treatment Notes Wound #1 (Lower Leg) Wound Laterality: Left, Lateral, Distal Cleanser [place] Ancillary Kit - 15 Day Supply Discharge Instruction: Use supplies as instructed; Kit contains: (15) Saline Bullets; (15) 3x3 Gauze; 15 pr Gloves Peri-Wound [name] Topical Primary Dressing Xeroform-HBD 2x2 (in/in) Discharge Instruction: Apply Xeroform-HBD 2x2 (in/in) as directed Secondary Dressing (BORDER) Zetuvit Plus SILICONE BORDER Dressing 4x4 (in/in) Discharge Instruction: Please do not put silicone bordered dressings under wraps. Use non-bordered dressing only. Secured With Compression  Wrap Compression Stockings Facilities manager) Signed: 07/22/2023 1:15:19 PM By: [name] Entered By: Yevonne Pax on 07/19/2023 09:04:52 Gayla Doss ([id number]) [id number] Page 9 of 9 -------------------------------------------------------------------------------- Vitals Details Patient Name: Date of Service: JAQUAVIA, PINGEL 07/19/2023 8:45 A M Medical Record Number: [id number] Patient Account Number: [id number] Date of Birth/Sex: Treating RN: 06/27/1946 (77 y.o. [name] Primary [name] Gaylon Melchor: [name] Other Clinician: Referring Carmelo Reidel: Treating Emme Rosenau/Extender: [name] in Treatment: 12 Vital Signs Time Taken: 09:01 Temperature (F): 97.6 Height (in): 68 Pulse (bpm): 74 Weight (lbs): 200 Respiratory Rate (breaths/min): 18 Body Mass Index (BMI): 30.4 Blood Pressure (mmHg): 177/74 Reference Range: 80 - 120 mg / dl Electronic Signature(s) Signed: 07/22/2023 1:15:19 PM By: [name] Entered By: Yevonne Pax on 07/19/2023 09:01:29  Cleansing - one wound 1 5 []  - 0 Complex Wound Cleansing - multiple wounds X- 1 5 Wound Imaging (photographs - any number of wounds) []  - 0 Wound Tracing (instead of photographs) X- 1 5 Simple Wound Measurement - one wound []  - 0 Complex Wound Measurement - multiple wounds INTERVENTIONS - Wound Dressings X - Small Wound Dressing one or multiple wounds 1 10 []  - 0 Medium Wound Dressing one or multiple wounds []  - 0 Large Wound Dressing one or multiple wounds []  - 0 Application of Medications - topical []  - 0 Application of Medications - injection INTERVENTIONS - Miscellaneous []  - 0 External ear exam BAMA, ZEOLI ([id number]) [id number] Page 3 of 9 []  - 0 Specimen Collection (cultures, biopsies, blood, body fluids, etc.) []  - 0 Specimen(s) / Culture(s) sent or taken to Lab for analysis []  - 0 Patient Transfer (multiple staff / Michiel Sites Lift / Similar devices) []  - 0 Simple Staple / Suture removal (25 or less) []  - 0 Complex Staple / Suture removal (26 or more) []  - 0 Hypo / Hyperglycemic Management (close monitor of Blood Glucose) []  - 0 Ankle / Brachial Index (ABI) - do not check if billed separately X- 1 5 Vital Signs Has the patient been seen at the hospital within the last three years: Yes Total Score: 75 Level Of [name]: New/Established - Level 2 Electronic Signature(s) Signed: 07/22/2023 1:15:19 PM By: [name] Entered By: Yevonne Pax on 07/19/2023 09:30:30 -------------------------------------------------------------------------------- Encounter Discharge Information Details Patient Name: Date of Service: [name], [name] J. 07/19/2023 8:45 A M Medical Record Number: [id number] Patient Account  Number: [id number] Date of Birth/Sex: Treating RN: 1945-11-15 (77 y.o. [name] Primary [name] Terrica Duecker: [name] Other Clinician: Referring Nautia Lem: Treating Mackenize Delgadillo/Extender: [name] in Treatment: 12 Encounter Discharge Information Items Discharge Condition: Stable Ambulatory Status: Ambulatory Discharge Destination: Home Transportation: Private Auto Accompanied By: self Schedule Follow-up Appointment: Yes Clinical Summary of [name]: Electronic Signature(s) Signed: 07/22/2023 1:15:19 PM By: [name] Entered By: Yevonne Pax on 07/19/2023 09:31:11 -------------------------------------------------------------------------------- Lower Extremity Assessment Details Patient Name: Date of Service: BRITTINAY, SHERRICK 07/19/2023 8:45 A BETTYMAE, SCHLAFF ([id number]) [phone].pdf Page 4 of 9 Medical Record Number: [id number] Patient Account Number: [id number] Date of Birth/Sex: Treating RN: 08/15/1946 (77 y.o. [name] Primary [name] [name]: [name] Other Clinician: Referring Jalynn Waddell: Treating [name]/Extender: [name] in Treatment: 12 Edema Assessment Assessed: [name]: No] [name]: No] Edema: [Left: Ye] [Right: s] Calf Left: Right: Point of Measurement: 35 cm From Medial Instep 37 cm Ankle Left: Right: Point of Measurement: 10 cm From Medial Instep 24 cm Vascular Assessment Pulses: Dorsalis Pedis Palpable: [Left:Yes] Extremity colors, hair growth, and conditions: Extremity Color: [Left:Hyperpigmented] Hair Growth on Extremity: [Left:No] Temperature of Extremity: [Left:Warm] Capillary Refill: [Left:< 3 seconds] Dependent Rubor: [Left:No] Blanched when Elevated: [Left:No No] Toe Nail Assessment Left: Right: Thick: No Discolored: No Deformed: No Improper Length and Hygiene: No Electronic Signature(s) Signed: 07/22/2023 1:15:19 PM By: Yevonne Pax  RN Entered By: Yevonne Pax on 07/19/2023 09:05:44 -------------------------------------------------------------------------------- Multi Wound Chart Details Patient Name: Date of Service: [name], [name] J. 07/19/2023 8:45 A M Medical Record Number: [id number] Patient Account Number: [id number] Date of Birth/Sex: Treating RN: May 30, 1946 (77 y.o. [name] Primary [name] Anamari Galeas: [name] Other Clinician: Referring Chardai Gangemi: Treating Sera Hitsman/Extender: [name] in Treatment: 12 Vital Signs Height(in): 68 Pulse(bpm): 74 Weight(lbs): 200 Blood Pressure(mmHg): 177/74 Body Mass Index(BMI): 30.4  CORALINE, BEECHLER ([id number]) [id number] Page 1 of 9 Visit Report for 07/19/2023 Arrival Information Details Patient Name: Date of Service: ARISBEL, KEITA 07/19/2023 8:45 A M Medical Record Number: [id number] Patient Account Number: [id number] Date of Birth/Sex: Treating RN: [birth year] (77 y.o. [name] Primary [name] Joshu Furukawa: [name] Other Clinician: Referring Ricca Melgarejo: Treating Kadee Philyaw/Extender: [name] in Treatment: 12 Visit Information History Since Last Visit Added or deleted any medications: No Patient Arrived: Ambulatory Any new allergies or adverse reactions: No Arrival Time: 09:00 Had a fall or experienced change in No Accompanied By: self activities of daily living that may affect Transfer Assistance: None risk of falls: Patient Identification Verified: Yes Signs or symptoms of abuse/neglect since last visito No Secondary Verification Process Completed: Yes Hospitalized since last visit: No Patient Requires Transmission-Based Precautions: No Implantable device outside of the clinic excluding No Patient Has Alerts: No cellular tissue based products placed in the center since last visit: Has Compression in Place as Prescribed: Yes Pain Present Now: No Electronic Signature(s) Signed: 07/22/2023 1:15:19 PM By: [name] Entered By: Yevonne Pax on 07/19/2023 09:01:00 -------------------------------------------------------------------------------- Clinic Level of [name] Assessment Details Patient Name: Date of Service: ADELAIDE, ROBYN 07/19/2023 8:45 A M Medical Record Number: [id number] Patient Account Number: [id number] Date of Birth/Sex: Treating RN: April 29, 1946 (77 y.o. [name] Primary [name] Verba Ainley: [name] Other Clinician: Referring Zakkery Dorian: Treating Ahaan Zobrist/Extender: [name] in Treatment: 12 Clinic Level of [name] Assessment  Items TOOL 4 Quantity Score X- 1 0 Use when only an EandM is performed on FOLLOW-UP visit ASSESSMENTS - Nursing Assessment / Reassessment X- 1 10 Reassessment of Co-morbidities (includes updates in patient status) X- 1 5 Reassessment of Adherence to Treatment Plan LESLIEANNE, VOLNER ([id number]) [id number] Page 2 of 9 ASSESSMENTS - Wound and Skin A ssessment / Reassessment X - Simple Wound Assessment / Reassessment - one wound 1 5 []  - 0 Complex Wound Assessment / Reassessment - multiple wounds []  - 0 Dermatologic / Skin Assessment (not related to wound area) ASSESSMENTS - Focused Assessment []  - 0 Circumferential Edema Measurements - multi extremities []  - 0 Nutritional Assessment / Counseling / Intervention []  - 0 Lower Extremity Assessment (monofilament, tuning fork, pulses) []  - 0 Peripheral Arterial Disease Assessment (using hand held doppler) ASSESSMENTS - Ostomy and/or Continence Assessment and [name] []  - 0 Incontinence Assessment and Management []  - 0 Ostomy [name] Assessment and Management (repouching, etc.) PROCESS - Coordination of [name] X - Simple Patient / Family Education for ongoing [name] 1 15 []  - 0 Complex (extensive) Patient / Family Education for ongoing [name] []  - 0 Staff obtains Chiropractor, Records, T Results / Process Orders est []  - 0 Staff telephones HHA, Nursing Homes / Clarify orders / etc []  - 0 Routine Transfer to another Facility (non-emergent condition) []  - 0 Routine Hospital Admission (non-emergent condition) []  - 0 New Admissions / Manufacturing engineer / Ordering NPWT Apligraf, etc. , []  - 0 Emergency Hospital Admission (emergent condition) X- 1 10 Simple Discharge Coordination []  - 0 Complex (extensive) Discharge Coordination PROCESS - Special Needs []  - 0 Pediatric / Minor Patient Management []  - 0 Isolation Patient Management []  - 0 Hearing / Language / Visual special needs []  - 0 Assessment of  Community assistance (transportation, D/C planning, etc.) []  - 0 Additional assistance / Altered mentation []  - 0 Support Surface(s) Assessment (bed, cushion, seat, etc.) INTERVENTIONS - Wound Cleansing / Measurement X - Simple Wound

## 2023-07-25 ENCOUNTER — Encounter: Payer: Federal, State, Local not specified - PPO | Admitting: Physician Assistant

## 2023-07-25 DIAGNOSIS — E11622 Type 2 diabetes mellitus with other skin ulcer: Secondary | ICD-10-CM | POA: Diagnosis not present

## 2023-07-25 NOTE — Progress Notes (Addendum)
very irritated I think the Vashe may have irritated her based on what she is telling me. Fortunately I do not see any evidence of  active infection locally nor systemically at this time which is great news. No fevers, chills, nausea, vomiting, or diarrhea. 7/22; wound on the left lateral lower leg in the setting of significant chronic venous hypertension. We have been using Prisma. She would not allow a compression wrap although she is using a support stocking. 06-06-2023 upon evaluation today patient's wound actually showing signs of significant improvement with regard to her wound. She has been tolerating the dressing changes without complication. Fortunately there does not appear to be any signs of active infection locally nor systemically at this time which is great news. No fevers, chills, nausea, vomiting, or diarrhea. 06-20-2023 upon evaluation today patient appears to be doing well currently in regard to her wound which is actually showing signs of significant improvement. Fortunately I do not see any evidence of worsening infection I do believe that the patient is moving in the right direction which is great news and in general I think that we are on the right track here. 06-27-2023 upon evaluation today patient appears to be doing well currently in regard to her wound. She has been tolerating the dressing changes without complication and in general I do feel like there were making really good headway towards complete closure. I do not see any signs of active infection locally or systemically at this time. 07-14-2023 upon evaluation today patient appears to be doing well currently in regard to her wound. She has been tolerating the dressing changes without complication. Fortunately I do not see any signs of active infection at this time which is great news. No fevers, chills, nausea, vomiting, or diarrhea. 07-19-2023 upon evaluation today patient appears to be doing well currently in regard to her wound although is not significantly smaller. I do think making a change in shift in the dressing would be a good idea here. My  suggestion is gena be that we go ahead and see about doing a Xeroform gauze dressing to see how this will do she is in agreement with that plan. 07-25-2023 upon evaluation today patient appears to be doing well currently in regard to her wound although she tells me the medicine burns pretty much all week. The visual appearance of the wound is significantly improved compared to what it was previous which is good news. Electronic Signature(s) Signed: 07/25/2023 8:36:28 AM By: Allen Derry PA-C Entered By: Allen Derry on 07/25/2023 05:36:28 -------------------------------------------------------------------------------- Physical Exam Details Patient Name: Date of Service: DENA, ESSNER 07/25/2023 7:30 A M Medical Record Number: [id number] Patient Account Number: [id number] Date of Birth/Sex: Treating RN: 06/17/1946 (77 y.o. [name] Primary Care Provider: [name] Other Clinician: Referring Provider: Treating Provider/Extender: [name] in Treatment: 13 Constitutional Well-nourished and well-hydrated in no acute distress. Respiratory normal breathing without difficulty. Psychiatric this patient is able to make decisions and demonstrates good insight into disease process. Alert and Oriented x 3. pleasant and cooperative. Notes Upon inspection patient's wound bed actually showed signs of good granulation and epithelization at this time. I actually see some improvements compared to last week. With that being said unfortunately she is just having a lot of issues with the burning I think we will switch away from the Xeroform that she is in a standard petroleum gauze she is in agreement with that plan. Electronic Signature(s) Signed: 07/25/2023 8:36:55 AM By: Larina Bras,  often as possible Elevate leg(s) parallel to the floor when sitting. WOUND #1: - Lower Leg Wound Laterality: Left, Lateral,  Distal Cleanser: Byram Ancillary Kit - 15 Day Supply (Generic) 3 x Per Week/30 Days Discharge Instructions: Use supplies as instructed; Kit contains: (15) Saline Bullets; (15) 3x3 Gauze; 15 pr Gloves Prim Dressing: Vaseline Impregnated Gauze Dressing, 3x9 (in/in) 3 x Per Week/30 Days ary Secondary Dressing: (BORDER) Zetuvit Plus SILICONE BORDER Dressing 4x4 (in/in) 3 x Per Week/30 Days Discharge Instructions: Please do not put silicone bordered dressings under wraps. Use non-bordered dressing only. 1. Based on what I am seeing I do believe that the patient would benefit from switching over to petroleum gauze we will see if this will prevent a burning I know she had some discomfort there last week though she still use the medicine and it did seem to help. 2. I am going to recommend as well the patient should continue with the Zetuvit to cover. With 3 she should change this 3 times per week or as needed if soiled. We will see patient back for reevaluation in 1 week here in the clinic. If anything worsens or changes patient will contact our office for additional recommendations. Electronic Signature(s) Signed: 07/25/2023 8:37:22 AM By: Allen Derry PA-C Entered By: Allen Derry on 07/25/2023 05:37:22 -------------------------------------------------------------------------------- SuperBill Details Patient Name: Date of Service: [name], Nestor Ramp 07/25/2023 Medical Record Number: [id number] Patient Account Number: [id number] Date of Birth/Sex: Treating RN: 03/17/1946 (77 y.o. [name] Primary Care Provider: [name] Other Clinician: Referring Provider: Treating Provider/Extender: [name] in Treatment: [address] [place] J ([id number]) [id number] Page 7 of 7 Diagnosis Coding ICD-10 Codes Code Description [phone] Chronic venous hypertension (idiopathic) with ulcer and inflammation of left lower extremity E11.622 Type 2  diabetes mellitus with other skin ulcer I89.0 Lymphedema, not elsewhere classified L97.822 Non-pressure chronic ulcer of other part of left lower leg with fat layer exposed I10 Essential (primary) hypertension Facility Procedures : CPT4 Code: 28413244 Description: 99213 - WOUND CARE VISIT-LEV 3 EST PT Modifier: Quantity: 1 Physician Procedures : CPT4 Code Description Modifier [id number] 99213 - WC PHYS LEVEL 3 - EST PT ICD-10 Diagnosis Description I87.332 Chronic venous hypertension (idiopathic) with ulcer and inflammation of left lower extremity E11.622 Type 2 diabetes mellitus with other skin  ulcer I89.0 Lymphedema, not elsewhere classified L97.822 Non-pressure chronic ulcer of other part of left lower leg with fat layer exposed Quantity: 1 Electronic Signature(s) Signed: 07/25/2023 8:38:32 AM By: Allen Derry PA-C Entered By: Allen Derry on 07/25/2023 05:38:32  Jennise Both PA-C Entered By: Allen Derry on 07/25/2023 05:36:54 Gayla Doss ([id number]) [id number] Page 3 of  7 -------------------------------------------------------------------------------- Physician Orders Details Patient Name: Date of Service: NUBIA, STMARIE 07/25/2023 7:30 A M Medical Record Number: [id number] Patient Account Number: [id number] Date of Birth/Sex: Treating RN: 1946/01/19 (77 y.o. [name] Primary Care Provider: [name] Other Clinician: Referring Provider: Treating Provider/Extender: [name] in Treatment: 13 Verbal / Phone Orders: No Diagnosis Coding ICD-10 Coding Code Description [phone] Chronic venous hypertension (idiopathic) with ulcer and inflammation of left lower extremity E11.622 Type 2 diabetes mellitus with other skin ulcer I89.0 Lymphedema, not elsewhere classified L97.822 Non-pressure chronic ulcer of other part of left lower leg with fat layer exposed I10 Essential (primary) hypertension Follow-up Appointments Return Appointment in 1 week. Bathing/ Shower/ Hygiene May shower; gently cleanse wound with antibacterial soap, rinse and pat dry prior to dressing wounds Edema Control - Lymphedema / Segmental Compressive Device / Other Patient to wear own compression stockings. Remove compression stockings every night before going to bed and put on every morning when getting up. - bi lat Elevate, Exercise Daily and A void Standing for Long Periods of Time. Elevate legs to the level of the heart and pump ankles as often as possible Elevate leg(s) parallel to the floor when sitting. Wound Treatment Wound #1 - Lower Leg Wound Laterality: Left, Lateral, Distal Cleanser: Byram Ancillary Kit - 15 Day Supply (Generic) 3 x Per Week/30 Days Discharge Instructions: Use supplies as instructed; Kit contains: (15) Saline Bullets; (15) 3x3 Gauze; 15 pr Gloves Prim Dressing: Vaseline Impregnated Gauze Dressing, 3x9 (in/in) ary 3 x Per Week/30 Days Secondary Dressing: (BORDER) Zetuvit Plus SILICONE BORDER Dressing 4x4 (in/in)  3 x Per Week/30 Days Discharge Instructions: Please do not put silicone bordered dressings under wraps. Use non-bordered dressing only. Electronic Signature(s) Signed: 07/25/2023 12:32:57 PM By: Allen Derry PA-C Signed: 07/25/2023 3:37:18 PM By: Angelina Pih Entered By: Angelina Pih on 07/25/2023 05:11:36 Ethelle Lyon, Nestor Ramp ([id number]) [id number] Page 4 of 7 -------------------------------------------------------------------------------- Problem List Details Patient Name: Date of Service: BLINDA, BAUMHARDT 07/25/2023 7:30 A M Medical Record Number: [id number] Patient Account Number: [id number] Date of Birth/Sex: Treating RN: 1946/06/23 (77 y.o. [name] Primary Care Provider: [name] Other Clinician: Referring Provider: Treating Provider/Extender: [name] in Treatment: 13 Active Problems ICD-10 Encounter Code Description Active Date MDM Diagnosis I87.332 Chronic venous hypertension (idiopathic) with ulcer and inflammation of left 04/22/2023 No Yes lower extremity E11.622 Type 2 diabetes mellitus with other skin ulcer 04/22/2023 No Yes I89.0 Lymphedema, not elsewhere classified 04/22/2023 No Yes L97.822 Non-pressure chronic ulcer of other part of left lower leg with fat layer exposed6/14/2024 No Yes I10 Essential (primary) hypertension 04/22/2023 No Yes Inactive Problems Resolved Problems Electronic Signature(s) Signed: 07/25/2023 7:59:57 AM By: Allen Derry PA-C Entered By: Allen Derry on 07/25/2023 04:59:57 -------------------------------------------------------------------------------- Progress Note Details Patient Name: Date of Service: [name] 07/25/2023 7:30 A M Medical Record Number: [id number] Patient Account Number: [id number] Date of Birth/Sex: Treating RN: January 21, 1946 (77 y.o. [name] [place], [place] J ([id number]) [phone].pdf Page 5 of  7 Primary Care Provider: [name] Other Clinician: Referring Provider: Treating Provider/Extender: [name] in Treatment: 13 Subjective Chief Complaint Information obtained from Patient Left LE Ulcer History of Present Illness (HPI) 04-22-2023 upon evaluation patient appears to be doing somewhat poorly in regard to a wound on the left distal/lateral lower extremity and about the ankle  often as possible Elevate leg(s) parallel to the floor when sitting. WOUND #1: - Lower Leg Wound Laterality: Left, Lateral,  Distal Cleanser: Byram Ancillary Kit - 15 Day Supply (Generic) 3 x Per Week/30 Days Discharge Instructions: Use supplies as instructed; Kit contains: (15) Saline Bullets; (15) 3x3 Gauze; 15 pr Gloves Prim Dressing: Vaseline Impregnated Gauze Dressing, 3x9 (in/in) 3 x Per Week/30 Days ary Secondary Dressing: (BORDER) Zetuvit Plus SILICONE BORDER Dressing 4x4 (in/in) 3 x Per Week/30 Days Discharge Instructions: Please do not put silicone bordered dressings under wraps. Use non-bordered dressing only. 1. Based on what I am seeing I do believe that the patient would benefit from switching over to petroleum gauze we will see if this will prevent a burning I know she had some discomfort there last week though she still use the medicine and it did seem to help. 2. I am going to recommend as well the patient should continue with the Zetuvit to cover. With 3 she should change this 3 times per week or as needed if soiled. We will see patient back for reevaluation in 1 week here in the clinic. If anything worsens or changes patient will contact our office for additional recommendations. Electronic Signature(s) Signed: 07/25/2023 8:37:22 AM By: Allen Derry PA-C Entered By: Allen Derry on 07/25/2023 05:37:22 -------------------------------------------------------------------------------- SuperBill Details Patient Name: Date of Service: [name], Nestor Ramp 07/25/2023 Medical Record Number: [id number] Patient Account Number: [id number] Date of Birth/Sex: Treating RN: 03/17/1946 (77 y.o. [name] Primary Care Provider: [name] Other Clinician: Referring Provider: Treating Provider/Extender: [name] in Treatment: [address] [place] J ([id number]) [id number] Page 7 of 7 Diagnosis Coding ICD-10 Codes Code Description [phone] Chronic venous hypertension (idiopathic) with ulcer and inflammation of left lower extremity E11.622 Type 2  diabetes mellitus with other skin ulcer I89.0 Lymphedema, not elsewhere classified L97.822 Non-pressure chronic ulcer of other part of left lower leg with fat layer exposed I10 Essential (primary) hypertension Facility Procedures : CPT4 Code: [id number] Description: 99213 - WOUND CARE VISIT-LEV 3 EST PT Modifier: Quantity: 1 Physician Procedures : CPT4 Code Description Modifier [id number] 99213 - WC PHYS LEVEL 3 - EST PT ICD-10 Diagnosis Description I87.332 Chronic venous hypertension (idiopathic) with ulcer and inflammation of left lower extremity E11.622 Type 2 diabetes mellitus with other skin  ulcer I89.0 Lymphedema, not elsewhere classified L97.822 Non-pressure chronic ulcer of other part of left lower leg with fat layer exposed Quantity: 1 Electronic Signature(s) Signed: 07/25/2023 8:38:32 AM By: Allen Derry PA-C Entered By: Allen Derry on 07/25/2023 05:38:32  Jennise Both PA-C Entered By: Allen Derry on 07/25/2023 05:36:54 Gayla Doss ([id number]) [id number] Page 3 of  7 -------------------------------------------------------------------------------- Physician Orders Details Patient Name: Date of Service: NUBIA, STMARIE 07/25/2023 7:30 A M Medical Record Number: [id number] Patient Account Number: [id number] Date of Birth/Sex: Treating RN: 1946/01/19 (77 y.o. [name] Primary Care Provider: [name] Other Clinician: Referring Provider: Treating Provider/Extender: [name] in Treatment: 13 Verbal / Phone Orders: No Diagnosis Coding ICD-10 Coding Code Description [phone] Chronic venous hypertension (idiopathic) with ulcer and inflammation of left lower extremity E11.622 Type 2 diabetes mellitus with other skin ulcer I89.0 Lymphedema, not elsewhere classified L97.822 Non-pressure chronic ulcer of other part of left lower leg with fat layer exposed I10 Essential (primary) hypertension Follow-up Appointments Return Appointment in 1 week. Bathing/ Shower/ Hygiene May shower; gently cleanse wound with antibacterial soap, rinse and pat dry prior to dressing wounds Edema Control - Lymphedema / Segmental Compressive Device / Other Patient to wear own compression stockings. Remove compression stockings every night before going to bed and put on every morning when getting up. - bi lat Elevate, Exercise Daily and A void Standing for Long Periods of Time. Elevate legs to the level of the heart and pump ankles as often as possible Elevate leg(s) parallel to the floor when sitting. Wound Treatment Wound #1 - Lower Leg Wound Laterality: Left, Lateral, Distal Cleanser: Byram Ancillary Kit - 15 Day Supply (Generic) 3 x Per Week/30 Days Discharge Instructions: Use supplies as instructed; Kit contains: (15) Saline Bullets; (15) 3x3 Gauze; 15 pr Gloves Prim Dressing: Vaseline Impregnated Gauze Dressing, 3x9 (in/in) ary 3 x Per Week/30 Days Secondary Dressing: (BORDER) Zetuvit Plus SILICONE BORDER Dressing 4x4 (in/in)  3 x Per Week/30 Days Discharge Instructions: Please do not put silicone bordered dressings under wraps. Use non-bordered dressing only. Electronic Signature(s) Signed: 07/25/2023 12:32:57 PM By: Allen Derry PA-C Signed: 07/25/2023 3:37:18 PM By: Angelina Pih Entered By: Angelina Pih on 07/25/2023 05:11:36 Ethelle Lyon, Nestor Ramp ([id number]) [id number] Page 4 of 7 -------------------------------------------------------------------------------- Problem List Details Patient Name: Date of Service: BLINDA, BAUMHARDT 07/25/2023 7:30 A M Medical Record Number: [id number] Patient Account Number: [id number] Date of Birth/Sex: Treating RN: 1946/06/23 (77 y.o. [name] Primary Care Provider: [name] Other Clinician: Referring Provider: Treating Provider/Extender: [name] in Treatment: 13 Active Problems ICD-10 Encounter Code Description Active Date MDM Diagnosis I87.332 Chronic venous hypertension (idiopathic) with ulcer and inflammation of left 04/22/2023 No Yes lower extremity E11.622 Type 2 diabetes mellitus with other skin ulcer 04/22/2023 No Yes I89.0 Lymphedema, not elsewhere classified 04/22/2023 No Yes L97.822 Non-pressure chronic ulcer of other part of left lower leg with fat layer exposed6/14/2024 No Yes I10 Essential (primary) hypertension 04/22/2023 No Yes Inactive Problems Resolved Problems Electronic Signature(s) Signed: 07/25/2023 7:59:57 AM By: Allen Derry PA-C Entered By: Allen Derry on 07/25/2023 04:59:57 -------------------------------------------------------------------------------- Progress Note Details Patient Name: Date of Service: [name] 07/25/2023 7:30 A M Medical Record Number: [id number] Patient Account Number: [id number] Date of Birth/Sex: Treating RN: January 21, 1946 (77 y.o. [name] [place], [place] J ([id number]) [phone].pdf Page 5 of  7 Primary Care Provider: [name] Other Clinician: Referring Provider: Treating Provider/Extender: [name] in Treatment: 13 Subjective Chief Complaint Information obtained from Patient Left LE Ulcer History of Present Illness (HPI) 04-22-2023 upon evaluation patient appears to be doing somewhat poorly in regard to a wound on the left distal/lateral lower extremity and about the ankle  Jennise Both PA-C Entered By: Allen Derry on 07/25/2023 05:36:54 Gayla Doss ([id number]) [id number] Page 3 of  7 -------------------------------------------------------------------------------- Physician Orders Details Patient Name: Date of Service: NUBIA, STMARIE 07/25/2023 7:30 A M Medical Record Number: [id number] Patient Account Number: [id number] Date of Birth/Sex: Treating RN: 1946/01/19 (77 y.o. [name] Primary Care Provider: [name] Other Clinician: Referring Provider: Treating Provider/Extender: [name] in Treatment: 13 Verbal / Phone Orders: No Diagnosis Coding ICD-10 Coding Code Description [phone] Chronic venous hypertension (idiopathic) with ulcer and inflammation of left lower extremity E11.622 Type 2 diabetes mellitus with other skin ulcer I89.0 Lymphedema, not elsewhere classified L97.822 Non-pressure chronic ulcer of other part of left lower leg with fat layer exposed I10 Essential (primary) hypertension Follow-up Appointments Return Appointment in 1 week. Bathing/ Shower/ Hygiene May shower; gently cleanse wound with antibacterial soap, rinse and pat dry prior to dressing wounds Edema Control - Lymphedema / Segmental Compressive Device / Other Patient to wear own compression stockings. Remove compression stockings every night before going to bed and put on every morning when getting up. - bi lat Elevate, Exercise Daily and A void Standing for Long Periods of Time. Elevate legs to the level of the heart and pump ankles as often as possible Elevate leg(s) parallel to the floor when sitting. Wound Treatment Wound #1 - Lower Leg Wound Laterality: Left, Lateral, Distal Cleanser: Byram Ancillary Kit - 15 Day Supply (Generic) 3 x Per Week/30 Days Discharge Instructions: Use supplies as instructed; Kit contains: (15) Saline Bullets; (15) 3x3 Gauze; 15 pr Gloves Prim Dressing: Vaseline Impregnated Gauze Dressing, 3x9 (in/in) ary 3 x Per Week/30 Days Secondary Dressing: (BORDER) Zetuvit Plus SILICONE BORDER Dressing 4x4 (in/in)  3 x Per Week/30 Days Discharge Instructions: Please do not put silicone bordered dressings under wraps. Use non-bordered dressing only. Electronic Signature(s) Signed: 07/25/2023 12:32:57 PM By: Allen Derry PA-C Signed: 07/25/2023 3:37:18 PM By: Angelina Pih Entered By: Angelina Pih on 07/25/2023 05:11:36 Ethelle Lyon, Nestor Ramp ([id number]) [id number] Page 4 of 7 -------------------------------------------------------------------------------- Problem List Details Patient Name: Date of Service: BLINDA, BAUMHARDT 07/25/2023 7:30 A M Medical Record Number: [id number] Patient Account Number: [id number] Date of Birth/Sex: Treating RN: 1946/06/23 (77 y.o. [name] Primary Care Provider: [name] Other Clinician: Referring Provider: Treating Provider/Extender: [name] in Treatment: 13 Active Problems ICD-10 Encounter Code Description Active Date MDM Diagnosis I87.332 Chronic venous hypertension (idiopathic) with ulcer and inflammation of left 04/22/2023 No Yes lower extremity E11.622 Type 2 diabetes mellitus with other skin ulcer 04/22/2023 No Yes I89.0 Lymphedema, not elsewhere classified 04/22/2023 No Yes L97.822 Non-pressure chronic ulcer of other part of left lower leg with fat layer exposed6/14/2024 No Yes I10 Essential (primary) hypertension 04/22/2023 No Yes Inactive Problems Resolved Problems Electronic Signature(s) Signed: 07/25/2023 7:59:57 AM By: Allen Derry PA-C Entered By: Allen Derry on 07/25/2023 04:59:57 -------------------------------------------------------------------------------- Progress Note Details Patient Name: Date of Service: [name] 07/25/2023 7:30 A M Medical Record Number: [id number] Patient Account Number: [id number] Date of Birth/Sex: Treating RN: January 21, 1946 (77 y.o. [name] [place], [place] J ([id number]) [phone].pdf Page 5 of  7 Primary Care Provider: [name] Other Clinician: Referring Provider: Treating Provider/Extender: [name] in Treatment: 13 Subjective Chief Complaint Information obtained from Patient Left LE Ulcer History of Present Illness (HPI) 04-22-2023 upon evaluation patient appears to be doing somewhat poorly in regard to a wound on the left distal/lateral lower extremity and about the ankle

## 2023-07-25 NOTE — Progress Notes (Signed)
Wound Tracing (instead of photographs) X- 1 5 Simple Wound Measurement - one wound []  - 0 Complex Wound Measurement - multiple wounds INTERVENTIONS - Wound Dressings X - Small Wound Dressing one or multiple wounds 1 10 []  - 0 Medium Wound Dressing one or multiple wounds []  - 0 Large Wound Dressing one or multiple wounds []  - 0 Application of Medications - topical []  - 0 Application of Medications - injection INTERVENTIONS - Miscellaneous []  - 0 External ear exam []  - 0 Specimen Collection (cultures, biopsies, blood, body fluids, etc.) []  - 0 Specimen(s) / Culture(s) sent or taken to Lab for analysis [name], [name] ([id number]) [id number] Page 3 of 9 []  - 0 Patient Transfer (multiple staff / Nurse, adult / Similar devices) []  - 0 Simple Staple / Suture removal (25 or less) []  - 0 Complex Staple / Suture removal (26 or more) []  - 0 Hypo / Hyperglycemic Management (close monitor of Blood Glucose) []  - 0 Ankle / Brachial Index (ABI) - do not check if billed separately X- 1 5 Vital Signs Has the patient been seen at the hospital within the last three years: Yes Total Score: 85 Level Of [name]: New/Established - Level 3 Electronic Signature(s) Signed: 07/25/2023 3:37:18 PM By: Angelina Pih Entered By: Angelina Pih on 07/25/2023 05:08:23 -------------------------------------------------------------------------------- Encounter Discharge Information Details Patient Name: Date of Service: Christine Saxon J. 07/25/2023 7:30 A M Medical Record Number: [id number] Patient Account Number: [id number] Date of Birth/Sex: Treating RN: [birth year] (77 y.o. Esmeralda Links Primary [name] Caleb Decock: [name] Other Clinician: Referring  Talyn Eddie: Treating Barry Faircloth/Extender: [name] in Treatment: 13 Encounter Discharge Information Items Discharge Condition: Stable Ambulatory Status: Ambulatory Discharge Destination: Home Transportation: Private Auto Accompanied By: self Schedule Follow-up Appointment: Yes Clinical Summary of [name]: Electronic Signature(s) Signed: 07/25/2023 3:37:18 PM By: Angelina Pih Entered By: Angelina Pih on 07/25/2023 05:11:00 -------------------------------------------------------------------------------- Lower Extremity Assessment Details Patient Name: Date of Service: [name], [name] 07/25/2023 7:30 A M Medical Record Number: [id number] Patient Account Number: [id number] Date of Birth/Sex: Treating RN: [birth year] (77 y.o. Esmeralda Links Primary [name] Angelene Rome: [name] Other Clinician: Referring Nazaiah Navarrete: Treating Ewel Lona/Extender: Kenady, Paulino, Nestor Ramp ([id number]) [id number] Page 4 of 9 Weeks in Treatment: 13 Edema Assessment Assessed: [Left: No] [Right: No] Edema: [Left: Ye] [Right: s] Calf Left: Right: Point of Measurement: 34 cm From Medial Instep 39.5 cm Ankle Left: Right: Point of Measurement: 12 cm From Medial Instep 24 cm Vascular Assessment Pulses: Dorsalis Pedis Palpable: [Left:Yes] Posterior Tibial Palpable: [Left:Yes] Extremity colors, hair growth, and conditions: Extremity Color: [Left:Hyperpigmented] Hair Growth on Extremity: [Left:Yes] Temperature of Extremity: [Left:Warm < 3 seconds] Toe Nail Assessment Left: Right: Thick: No Discolored: No Deformed: No Improper Length and Hygiene: No Electronic Signature(s) Signed: 07/25/2023 3:37:18 PM By: Angelina Pih Entered By: Angelina Pih on 07/25/2023 04:42:18 -------------------------------------------------------------------------------- Multi Wound Chart Details Patient Name: Date of Service: [name],  [name] J. 07/25/2023 7:30 A M Medical Record Number: [id number] Patient Account Number: [id number] Date of Birth/Sex: Treating RN: 05-26-1946 (77 y.o. 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, Hypertension, Type II N/A N/A Comorbid  Wound Tracing (instead of photographs) X- 1 5 Simple Wound Measurement - one wound []  - 0 Complex Wound Measurement - multiple wounds INTERVENTIONS - Wound Dressings X - Small Wound Dressing one or multiple wounds 1 10 []  - 0 Medium Wound Dressing one or multiple wounds []  - 0 Large Wound Dressing one or multiple wounds []  - 0 Application of Medications - topical []  - 0 Application of Medications - injection INTERVENTIONS - Miscellaneous []  - 0 External ear exam []  - 0 Specimen Collection (cultures, biopsies, blood, body fluids, etc.) []  - 0 Specimen(s) / Culture(s) sent or taken to Lab for analysis [name], [name] ([id number]) [id number] Page 3 of 9 []  - 0 Patient Transfer (multiple staff / Nurse, adult / Similar devices) []  - 0 Simple Staple / Suture removal (25 or less) []  - 0 Complex Staple / Suture removal (26 or more) []  - 0 Hypo / Hyperglycemic Management (close monitor of Blood Glucose) []  - 0 Ankle / Brachial Index (ABI) - do not check if billed separately X- 1 5 Vital Signs Has the patient been seen at the hospital within the last three years: Yes Total Score: 85 Level Of [name]: New/Established - Level 3 Electronic Signature(s) Signed: 07/25/2023 3:37:18 PM By: Angelina Pih Entered By: Angelina Pih on 07/25/2023 05:08:23 -------------------------------------------------------------------------------- Encounter Discharge Information Details Patient Name: Date of Service: Christine Saxon J. 07/25/2023 7:30 A M Medical Record Number: [id number] Patient Account Number: [id number] Date of Birth/Sex: Treating RN: [birth year] (77 y.o. Esmeralda Links Primary [name] Caleb Decock: [name] Other Clinician: Referring  Talyn Eddie: Treating Barry Faircloth/Extender: [name] in Treatment: 13 Encounter Discharge Information Items Discharge Condition: Stable Ambulatory Status: Ambulatory Discharge Destination: Home Transportation: Private Auto Accompanied By: self Schedule Follow-up Appointment: Yes Clinical Summary of [name]: Electronic Signature(s) Signed: 07/25/2023 3:37:18 PM By: Angelina Pih Entered By: Angelina Pih on 07/25/2023 05:11:00 -------------------------------------------------------------------------------- Lower Extremity Assessment Details Patient Name: Date of Service: [name], [name] 07/25/2023 7:30 A M Medical Record Number: [id number] Patient Account Number: [id number] Date of Birth/Sex: Treating RN: [birth year] (77 y.o. Esmeralda Links Primary [name] Angelene Rome: [name] Other Clinician: Referring Nazaiah Navarrete: Treating Ewel Lona/Extender: Kenady, Paulino, Nestor Ramp ([id number]) [id number] Page 4 of 9 Weeks in Treatment: 13 Edema Assessment Assessed: [Left: No] [Right: No] Edema: [Left: Ye] [Right: s] Calf Left: Right: Point of Measurement: 34 cm From Medial Instep 39.5 cm Ankle Left: Right: Point of Measurement: 12 cm From Medial Instep 24 cm Vascular Assessment Pulses: Dorsalis Pedis Palpable: [Left:Yes] Posterior Tibial Palpable: [Left:Yes] Extremity colors, hair growth, and conditions: Extremity Color: [Left:Hyperpigmented] Hair Growth on Extremity: [Left:Yes] Temperature of Extremity: [Left:Warm < 3 seconds] Toe Nail Assessment Left: Right: Thick: No Discolored: No Deformed: No Improper Length and Hygiene: No Electronic Signature(s) Signed: 07/25/2023 3:37:18 PM By: Angelina Pih Entered By: Angelina Pih on 07/25/2023 04:42:18 -------------------------------------------------------------------------------- Multi Wound Chart Details Patient Name: Date of Service: [name],  [name] J. 07/25/2023 7:30 A M Medical Record Number: [id number] Patient Account Number: [id number] Date of Birth/Sex: Treating RN: 05-26-1946 (77 y.o. 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, Hypertension, Type II N/A N/A Comorbid  Wound Tracing (instead of photographs) X- 1 5 Simple Wound Measurement - one wound []  - 0 Complex Wound Measurement - multiple wounds INTERVENTIONS - Wound Dressings X - Small Wound Dressing one or multiple wounds 1 10 []  - 0 Medium Wound Dressing one or multiple wounds []  - 0 Large Wound Dressing one or multiple wounds []  - 0 Application of Medications - topical []  - 0 Application of Medications - injection INTERVENTIONS - Miscellaneous []  - 0 External ear exam []  - 0 Specimen Collection (cultures, biopsies, blood, body fluids, etc.) []  - 0 Specimen(s) / Culture(s) sent or taken to Lab for analysis [name], [name] ([id number]) [id number] Page 3 of 9 []  - 0 Patient Transfer (multiple staff / Nurse, adult / Similar devices) []  - 0 Simple Staple / Suture removal (25 or less) []  - 0 Complex Staple / Suture removal (26 or more) []  - 0 Hypo / Hyperglycemic Management (close monitor of Blood Glucose) []  - 0 Ankle / Brachial Index (ABI) - do not check if billed separately X- 1 5 Vital Signs Has the patient been seen at the hospital within the last three years: Yes Total Score: 85 Level Of [name]: New/Established - Level 3 Electronic Signature(s) Signed: 07/25/2023 3:37:18 PM By: Angelina Pih Entered By: Angelina Pih on 07/25/2023 05:08:23 -------------------------------------------------------------------------------- Encounter Discharge Information Details Patient Name: Date of Service: Christine Saxon J. 07/25/2023 7:30 A M Medical Record Number: [id number] Patient Account Number: [id number] Date of Birth/Sex: Treating RN: [birth year] (77 y.o. Esmeralda Links Primary [name] Caleb Decock: [name] Other Clinician: Referring  Talyn Eddie: Treating Barry Faircloth/Extender: [name] in Treatment: 13 Encounter Discharge Information Items Discharge Condition: Stable Ambulatory Status: Ambulatory Discharge Destination: Home Transportation: Private Auto Accompanied By: self Schedule Follow-up Appointment: Yes Clinical Summary of [name]: Electronic Signature(s) Signed: 07/25/2023 3:37:18 PM By: Angelina Pih Entered By: Angelina Pih on 07/25/2023 05:11:00 -------------------------------------------------------------------------------- Lower Extremity Assessment Details Patient Name: Date of Service: [name], [name] 07/25/2023 7:30 A M Medical Record Number: [id number] Patient Account Number: [id number] Date of Birth/Sex: Treating RN: [birth year] (77 y.o. Esmeralda Links Primary [name] Angelene Rome: [name] Other Clinician: Referring Nazaiah Navarrete: Treating Ewel Lona/Extender: Kenady, Paulino, Nestor Ramp ([id number]) [id number] Page 4 of 9 Weeks in Treatment: 13 Edema Assessment Assessed: [Left: No] [Right: No] Edema: [Left: Ye] [Right: s] Calf Left: Right: Point of Measurement: 34 cm From Medial Instep 39.5 cm Ankle Left: Right: Point of Measurement: 12 cm From Medial Instep 24 cm Vascular Assessment Pulses: Dorsalis Pedis Palpable: [Left:Yes] Posterior Tibial Palpable: [Left:Yes] Extremity colors, hair growth, and conditions: Extremity Color: [Left:Hyperpigmented] Hair Growth on Extremity: [Left:Yes] Temperature of Extremity: [Left:Warm < 3 seconds] Toe Nail Assessment Left: Right: Thick: No Discolored: No Deformed: No Improper Length and Hygiene: No Electronic Signature(s) Signed: 07/25/2023 3:37:18 PM By: Angelina Pih Entered By: Angelina Pih on 07/25/2023 04:42:18 -------------------------------------------------------------------------------- Multi Wound Chart Details Patient Name: Date of Service: [name],  [name] J. 07/25/2023 7:30 A M Medical Record Number: [id number] Patient Account Number: [id number] Date of Birth/Sex: Treating RN: 05-26-1946 (77 y.o. 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 Esmeralda Links Primary [name] Raianna Slight: [name] Other Clinician: Referring Arvo Ealy: Treating Linken Mcglothen/Extender: [name] in Treatment: 13 Visit Information History Since Last Visit Added or deleted any medications: No Patient Arrived: Ambulatory Any new allergies or adverse reactions: No Arrival Time: 07:32 Had a fall or experienced change in No Accompanied By: self activities of daily living that may affect Transfer Assistance: None risk of falls: Patient Identification Verified: Yes Hospitalized since last visit: No Secondary Verification Process Completed: Yes Has Dressing in Place as Prescribed: Yes Patient Requires Transmission-Based Precautions: No Pain Present Now: Yes Patient Has Alerts: No Electronic Signature(s) Signed: 07/25/2023 3:37:18 PM By: Angelina Pih Entered By: Angelina Pih on 07/25/2023 04:33:18 -------------------------------------------------------------------------------- Clinic Level of [name] Assessment Details Patient Name: Date of Service: [name], [name] 07/25/2023 7:30 A M Medical Record Number: [id number] Patient Account Number: [id number] Date of Birth/Sex: Treating RN: 08/31/1946 (77 y.o. Esmeralda Links Primary [name] Broderick Fonseca: [name] Other Clinician: Referring Ardena Gangl: Treating Raynold Blankenbaker/Extender: [name] in Treatment: 13 Clinic Level of [name] Assessment Items TOOL 4 Quantity Score []  - 0 Use when only an EandM is performed on FOLLOW-UP visit ASSESSMENTS - Nursing Assessment / Reassessment X- 1 10 Reassessment of Co-morbidities  (includes updates in patient status) X- 1 5 Reassessment of Adherence to Treatment Plan ASSESSMENTS - Wound and Skin A ssessment / Reassessment X - Simple Wound Assessment / Reassessment - one wound 1 5 []  - 0 Complex Wound Assessment / Reassessment - multiple wounds [name], [name] ([id number]) [id number] Page 2 of 9 []  - 0 Dermatologic / Skin Assessment (not related to wound area) ASSESSMENTS - Focused Assessment []  - 0 Circumferential Edema Measurements - multi extremities []  - 0 Nutritional Assessment / Counseling / Intervention []  - 0 Lower Extremity Assessment (monofilament, tuning fork, pulses) []  - 0 Peripheral Arterial Disease Assessment (using hand held doppler) ASSESSMENTS - Ostomy and/or Continence Assessment and [name] []  - 0 Incontinence Assessment and Management []  - 0 Ostomy [name] Assessment and Management (repouching, etc.) PROCESS - Coordination of [name] X - Simple Patient / Family Education for ongoing [name] 1 15 []  - 0 Complex (extensive) Patient / Family Education for ongoing [name] X- 1 10 Staff obtains Chiropractor, Records, T Results / Process Orders est []  - 0 Staff telephones HHA, Nursing Homes / Clarify orders / etc []  - 0 Routine Transfer to another Facility (non-emergent condition) []  - 0 Routine Hospital Admission (non-emergent condition) []  - 0 New Admissions / Manufacturing engineer / Ordering NPWT Apligraf, etc. , []  - 0 Emergency Hospital Admission (emergent condition) X- 1 10 Simple Discharge Coordination []  - 0 Complex (extensive) Discharge Coordination PROCESS - Special Needs []  - 0 Pediatric / Minor Patient Management []  - 0 Isolation Patient Management []  - 0 Hearing / Language / Visual special needs []  - 0 Assessment of Community assistance (transportation, D/C planning, etc.) []  - 0 Additional assistance / Altered mentation []  - 0 Support Surface(s) Assessment (bed, cushion, seat,  etc.) INTERVENTIONS - Wound Cleansing / Measurement X - Simple Wound Cleansing - one wound 1 5 []  - 0 Complex Wound Cleansing - multiple wounds X- 1 5 Wound Imaging (photographs - any number of wounds) []  - 0

## 2023-08-01 ENCOUNTER — Encounter: Payer: Federal, State, Local not specified - PPO | Admitting: Physician Assistant

## 2023-08-01 DIAGNOSIS — E11622 Type 2 diabetes mellitus with other skin ulcer: Secondary | ICD-10-CM | POA: Diagnosis not present

## 2023-08-01 NOTE — Progress Notes (Signed)
Lower N/A N/A Primary Etiology: Extremity Lymphedema, Hypertension, Type II N/A N/A Comorbid History: Diabetes 02/07/2023 N/A N/A Date Acquired: 14 N/A N/A Weeks of Treatment: Open N/A N/A Wound Status: No N/A N/A Wound Recurrence: 0.3x0.05x0.1 N/A N/A Measurements L x W x D (cm) 0.012 N/A N/A A (cm) : rea 0.001 N/A N/A Volume (cm) : 96.60% N/A N/A % Reduction in A rea: 99.10% N/A N/A % Reduction in Volume: Grade 1 N/A N/A Classification: Medium N/A N/A Exudate A mount: Serosanguineous N/A N/A Exudate Type: red, brown N/A N/A Exudate Color: None Present (0%) N/A N/A Granulation A mount: Large (67-100%) N/A N/A Necrotic A mount: Fat Layer (Subcutaneous Tissue): Yes N/A N/A Exposed Structures: Fascia: No Tendon: No Muscle: No Joint: No Bone: No Small (1-33%) N/A N/A Epithelialization: Treatment Notes Electronic Signature(s) Signed: 08/01/2023 4:51:45 PM By: Angelina Pih Entered By: Angelina Pih on 08/01/2023 05:36:40 -------------------------------------------------------------------------------- Multi-Disciplinary [name] Plan Details Patient Name: Date of Service: [name], [name] J. 08/01/2023 8:30 A M Medical Record Number:  [id number] Patient Account Number: [id number] Date of Birth/Sex: Treating RN: 1945-11-16 (77 y.o. [name] Primary [name] Lore Polka: [name] Other Clinician: Referring Jeneane Pieczynski: Treating Dejay Kronk/Extender: [name] in Treatment: 14 Active Inactive Wound/Skin Impairment Nursing Diagnoses: Knowledge deficit related to ulceration/compromised skin integrity Goals: ICESS, LEATON ([id number]) [phone].pdf Page 6 of 9 Patient/caregiver will verbalize understanding of skin [name] regimen Date Initiated: 04/22/2023 Target Resolution Date: 08/22/2023 Goal Status: Active Ulcer/skin breakdown will have a volume reduction of 30% by week 4 Date Initiated: 04/22/2023 Date Inactivated: 06/27/2023 Target Resolution Date: 05/22/2023 Goal Status: Unmet Unmet Reason: comorbidities Ulcer/skin breakdown will have a volume reduction of 50% by week 8 Date Initiated: 04/22/2023 Date Inactivated: 06/27/2023 Target Resolution Date: 06/22/2023 Goal Status: Unmet Unmet Reason: comorbidities Ulcer/skin breakdown will have a volume reduction of 80% by week 12 Date Initiated: 04/22/2023 Date Inactivated: 07/25/2023 Target Resolution Date: 07/23/2023 Goal Status: Unmet Unmet Reason: comorbidities Ulcer/skin breakdown will heal within 14 weeks Date Initiated: 04/22/2023 Target Resolution Date: 08/22/2023 Goal Status: Active Interventions: Assess patient/caregiver ability to obtain necessary supplies Assess patient/caregiver ability to perform ulcer/skin [name] regimen upon admission and as needed Assess ulceration(s) every visit Notes: Electronic Signature(s) Signed: 08/01/2023 4:51:45 PM By: Angelina Pih Entered By: Angelina Pih on 08/01/2023 05:52:51 -------------------------------------------------------------------------------- Pain Assessment Details Patient Name: Date of Service: [name], [name] 08/01/2023 8:30 A M Medical Record  Number: [id number] Patient Account Number: [id number] Date of Birth/Sex: Treating RN: 08/24/1946 (77 y.o. [name] Primary [name] Nickey Canedo: [name] Other Clinician: Referring Jalina Blowers: Treating Easton Fetty/Extender: [name] in Treatment: 14 Active Problems Location of Pain Severity and Description of Pain Patient Has Paino No Site Locations Rate the pain. Current Pain Level: 0 Pain Management and Medication Current Pain Management: JUDEEN, CASABLANCA ([id number]) [phone].pdf Page 7 of 9 Electronic Signature(s) Signed: 08/01/2023 4:51:45 PM By: Angelina Pih Entered By: Angelina Pih on 08/01/2023 05:30:57 -------------------------------------------------------------------------------- Patient/Caregiver Education Details Patient Name: Date of Service: [name] 9/23/2024andnbsp8:30 A M Medical Record Number: [id number] Patient Account Number: [id number] Date of Birth/Gender: Treating RN: Nov 24, 1945 (77 y.o. [name] Primary [name] Physician: [name] Other Clinician: Referring Physician: Treating Physician/Extender: [name] in Treatment: 14 Education Assessment Education Provided To: Patient Education Topics Provided Wound/Skin Impairment: Handouts: Caring for Your Ulcer Methods: Explain/Verbal Responses: State content correctly Electronic Signature(s) Signed: 08/01/2023 4:51:45 PM By: Angelina Pih Entered By: Angelina Pih on 08/01/2023 05:53:12 -------------------------------------------------------------------------------- Wound Assessment  Christine, [name] ([id number]) [id number] Page 1 of 9 Visit Report for 08/01/2023 Arrival Information Details Patient Name: Date of Service: Christine, [name] 08/01/2023 8:30 A M Medical Record Number: [id number] Patient Account Number: [id number] Date of Birth/Sex: Treating RN: 11-05-1946 (77 y.o. [name] Primary [name] Alencia Gordon: [name] Other Clinician: Referring Jullia Mulligan: Treating Almir Botts/Extender: [name] in Treatment: 14 Visit Information History Since Last Visit Added or deleted any medications: No Patient Arrived: Ambulatory Any new allergies or adverse reactions: No Arrival Time: 08:28 Had a fall or experienced change in No Accompanied By: self activities of daily living that may affect Transfer Assistance: None risk of falls: Patient Identification Verified: Yes Hospitalized since last visit: No Secondary Verification Process Completed: Yes Has Dressing in Place as Prescribed: Yes Patient Requires Transmission-Based Precautions: No Has Compression in Place as Prescribed: No Patient Has Alerts: No Pain Present Now: No Electronic Signature(s) Signed: 08/01/2023 4:51:45 PM By: Angelina Pih Entered By: Angelina Pih on 08/01/2023 05:29:02 -------------------------------------------------------------------------------- Clinic Level of [name] Assessment Details Patient Name: Date of Service: Christine, [name] 08/01/2023 8:30 A M Medical Record Number: [id number] Patient Account Number: [id number] Date of Birth/Sex: Treating RN: 09-21-1946 (77 y.o. [name] Primary [name] Keymoni Mccaster: [name] Other Clinician: Referring Waunita Sandstrom: Treating Graig Hessling/Extender: [name] in Treatment: 14 Clinic Level of [name] Assessment Items TOOL 4 Quantity Score []  - 0 Use when only an EandM is performed on FOLLOW-UP visit ASSESSMENTS - Nursing Assessment /  Reassessment X- 1 10 Reassessment of Co-morbidities (includes updates in patient status) X- 1 5 Reassessment of Adherence to Treatment Plan ASSESSMENTS - Wound and Skin A ssessment / Reassessment X - Simple Wound Assessment / Reassessment - one wound 1 5 Monestime, Danelia J ([id number]) [id number] Page 2 of 9 []  - 0 Complex Wound Assessment / Reassessment - multiple wounds []  - 0 Dermatologic / Skin Assessment (not related to wound area) ASSESSMENTS - Focused Assessment []  - 0 Circumferential Edema Measurements - multi extremities []  - 0 Nutritional Assessment / Counseling / Intervention []  - 0 Lower Extremity Assessment (monofilament, tuning fork, pulses) []  - 0 Peripheral Arterial Disease Assessment (using hand held doppler) ASSESSMENTS - Ostomy and/or Continence Assessment and [name] []  - 0 Incontinence Assessment and Management []  - 0 Ostomy [name] Assessment and Management (repouching, etc.) PROCESS - Coordination of [name] X - Simple Patient / Family Education for ongoing [name] 1 15 []  - 0 Complex (extensive) Patient / Family Education for ongoing [name] X- 1 10 Staff obtains Chiropractor, Records, T Results / Process Orders est []  - 0 Staff telephones HHA, Nursing Homes / Clarify orders / etc []  - 0 Routine Transfer to another Facility (non-emergent condition) []  - 0 Routine Hospital Admission (non-emergent condition) []  - 0 New Admissions / Manufacturing engineer / Ordering NPWT Apligraf, etc. , []  - 0 Emergency Hospital Admission (emergent condition) X- 1 10 Simple Discharge Coordination []  - 0 Complex (extensive) Discharge Coordination PROCESS - Special Needs []  - 0 Pediatric / Minor Patient Management []  - 0 Isolation Patient Management []  - 0 Hearing / Language / Visual special needs []  - 0 Assessment of Community assistance (transportation, D/C planning, etc.) []  - 0 Additional assistance / Altered mentation []  - 0 Support  Surface(s) Assessment (bed, cushion, seat, etc.) INTERVENTIONS - Wound Cleansing / Measurement X - Simple Wound Cleansing - one wound 1 5 []  - 0 Complex Wound Cleansing - multiple wounds X- 1 5 Wound Imaging (photographs -  Lower N/A N/A Primary Etiology: Extremity Lymphedema, Hypertension, Type II N/A N/A Comorbid History: Diabetes 02/07/2023 N/A N/A Date Acquired: 14 N/A N/A Weeks of Treatment: Open N/A N/A Wound Status: No N/A N/A Wound Recurrence: 0.3x0.05x0.1 N/A N/A Measurements L x W x D (cm) 0.012 N/A N/A A (cm) : rea 0.001 N/A N/A Volume (cm) : 96.60% N/A N/A % Reduction in A rea: 99.10% N/A N/A % Reduction in Volume: Grade 1 N/A N/A Classification: Medium N/A N/A Exudate A mount: Serosanguineous N/A N/A Exudate Type: red, brown N/A N/A Exudate Color: None Present (0%) N/A N/A Granulation A mount: Large (67-100%) N/A N/A Necrotic A mount: Fat Layer (Subcutaneous Tissue): Yes N/A N/A Exposed Structures: Fascia: No Tendon: No Muscle: No Joint: No Bone: No Small (1-33%) N/A N/A Epithelialization: Treatment Notes Electronic Signature(s) Signed: 08/01/2023 4:51:45 PM By: Angelina Pih Entered By: Angelina Pih on 08/01/2023 05:36:40 -------------------------------------------------------------------------------- Multi-Disciplinary [name] Plan Details Patient Name: Date of Service: [name], [name] J. 08/01/2023 8:30 A M Medical Record Number:  [id number] Patient Account Number: [id number] Date of Birth/Sex: Treating RN: 1945-11-16 (77 y.o. [name] Primary [name] Lore Polka: [name] Other Clinician: Referring Jeneane Pieczynski: Treating Dejay Kronk/Extender: [name] in Treatment: 14 Active Inactive Wound/Skin Impairment Nursing Diagnoses: Knowledge deficit related to ulceration/compromised skin integrity Goals: ICESS, LEATON ([id number]) [phone].pdf Page 6 of 9 Patient/caregiver will verbalize understanding of skin [name] regimen Date Initiated: 04/22/2023 Target Resolution Date: 08/22/2023 Goal Status: Active Ulcer/skin breakdown will have a volume reduction of 30% by week 4 Date Initiated: 04/22/2023 Date Inactivated: 06/27/2023 Target Resolution Date: 05/22/2023 Goal Status: Unmet Unmet Reason: comorbidities Ulcer/skin breakdown will have a volume reduction of 50% by week 8 Date Initiated: 04/22/2023 Date Inactivated: 06/27/2023 Target Resolution Date: 06/22/2023 Goal Status: Unmet Unmet Reason: comorbidities Ulcer/skin breakdown will have a volume reduction of 80% by week 12 Date Initiated: 04/22/2023 Date Inactivated: 07/25/2023 Target Resolution Date: 07/23/2023 Goal Status: Unmet Unmet Reason: comorbidities Ulcer/skin breakdown will heal within 14 weeks Date Initiated: 04/22/2023 Target Resolution Date: 08/22/2023 Goal Status: Active Interventions: Assess patient/caregiver ability to obtain necessary supplies Assess patient/caregiver ability to perform ulcer/skin [name] regimen upon admission and as needed Assess ulceration(s) every visit Notes: Electronic Signature(s) Signed: 08/01/2023 4:51:45 PM By: Angelina Pih Entered By: Angelina Pih on 08/01/2023 05:52:51 -------------------------------------------------------------------------------- Pain Assessment Details Patient Name: Date of Service: [name], [name] 08/01/2023 8:30 A M Medical Record  Number: [id number] Patient Account Number: [id number] Date of Birth/Sex: Treating RN: 08/24/1946 (77 y.o. [name] Primary [name] Nickey Canedo: [name] Other Clinician: Referring Jalina Blowers: Treating Easton Fetty/Extender: [name] in Treatment: 14 Active Problems Location of Pain Severity and Description of Pain Patient Has Paino No Site Locations Rate the pain. Current Pain Level: 0 Pain Management and Medication Current Pain Management: JUDEEN, CASABLANCA ([id number]) [phone].pdf Page 7 of 9 Electronic Signature(s) Signed: 08/01/2023 4:51:45 PM By: Angelina Pih Entered By: Angelina Pih on 08/01/2023 05:30:57 -------------------------------------------------------------------------------- Patient/Caregiver Education Details Patient Name: Date of Service: [name] 9/23/2024andnbsp8:30 A M Medical Record Number: [id number] Patient Account Number: [id number] Date of Birth/Gender: Treating RN: Nov 24, 1945 (77 y.o. [name] Primary [name] Physician: [name] Other Clinician: Referring Physician: Treating Physician/Extender: [name] in Treatment: 14 Education Assessment Education Provided To: Patient Education Topics Provided Wound/Skin Impairment: Handouts: Caring for Your Ulcer Methods: Explain/Verbal Responses: State content correctly Electronic Signature(s) Signed: 08/01/2023 4:51:45 PM By: Angelina Pih Entered By: Angelina Pih on 08/01/2023 05:53:12 -------------------------------------------------------------------------------- Wound Assessment  Lower N/A N/A Primary Etiology: Extremity Lymphedema, Hypertension, Type II N/A N/A Comorbid History: Diabetes 02/07/2023 N/A N/A Date Acquired: 14 N/A N/A Weeks of Treatment: Open N/A N/A Wound Status: No N/A N/A Wound Recurrence: 0.3x0.05x0.1 N/A N/A Measurements L x W x D (cm) 0.012 N/A N/A A (cm) : rea 0.001 N/A N/A Volume (cm) : 96.60% N/A N/A % Reduction in A rea: 99.10% N/A N/A % Reduction in Volume: Grade 1 N/A N/A Classification: Medium N/A N/A Exudate A mount: Serosanguineous N/A N/A Exudate Type: red, brown N/A N/A Exudate Color: None Present (0%) N/A N/A Granulation A mount: Large (67-100%) N/A N/A Necrotic A mount: Fat Layer (Subcutaneous Tissue): Yes N/A N/A Exposed Structures: Fascia: No Tendon: No Muscle: No Joint: No Bone: No Small (1-33%) N/A N/A Epithelialization: Treatment Notes Electronic Signature(s) Signed: 08/01/2023 4:51:45 PM By: Angelina Pih Entered By: Angelina Pih on 08/01/2023 05:36:40 -------------------------------------------------------------------------------- Multi-Disciplinary [name] Plan Details Patient Name: Date of Service: [name], [name] J. 08/01/2023 8:30 A M Medical Record Number:  [id number] Patient Account Number: [id number] Date of Birth/Sex: Treating RN: 1945-11-16 (77 y.o. [name] Primary [name] Lore Polka: [name] Other Clinician: Referring Jeneane Pieczynski: Treating Dejay Kronk/Extender: [name] in Treatment: 14 Active Inactive Wound/Skin Impairment Nursing Diagnoses: Knowledge deficit related to ulceration/compromised skin integrity Goals: ICESS, LEATON ([id number]) [phone].pdf Page 6 of 9 Patient/caregiver will verbalize understanding of skin [name] regimen Date Initiated: 04/22/2023 Target Resolution Date: 08/22/2023 Goal Status: Active Ulcer/skin breakdown will have a volume reduction of 30% by week 4 Date Initiated: 04/22/2023 Date Inactivated: 06/27/2023 Target Resolution Date: 05/22/2023 Goal Status: Unmet Unmet Reason: comorbidities Ulcer/skin breakdown will have a volume reduction of 50% by week 8 Date Initiated: 04/22/2023 Date Inactivated: 06/27/2023 Target Resolution Date: 06/22/2023 Goal Status: Unmet Unmet Reason: comorbidities Ulcer/skin breakdown will have a volume reduction of 80% by week 12 Date Initiated: 04/22/2023 Date Inactivated: 07/25/2023 Target Resolution Date: 07/23/2023 Goal Status: Unmet Unmet Reason: comorbidities Ulcer/skin breakdown will heal within 14 weeks Date Initiated: 04/22/2023 Target Resolution Date: 08/22/2023 Goal Status: Active Interventions: Assess patient/caregiver ability to obtain necessary supplies Assess patient/caregiver ability to perform ulcer/skin [name] regimen upon admission and as needed Assess ulceration(s) every visit Notes: Electronic Signature(s) Signed: 08/01/2023 4:51:45 PM By: Angelina Pih Entered By: Angelina Pih on 08/01/2023 05:52:51 -------------------------------------------------------------------------------- Pain Assessment Details Patient Name: Date of Service: [name], [name] 08/01/2023 8:30 A M Medical Record  Number: [id number] Patient Account Number: [id number] Date of Birth/Sex: Treating RN: 08/24/1946 (77 y.o. [name] Primary [name] Nickey Canedo: [name] Other Clinician: Referring Jalina Blowers: Treating Easton Fetty/Extender: [name] in Treatment: 14 Active Problems Location of Pain Severity and Description of Pain Patient Has Paino No Site Locations Rate the pain. Current Pain Level: 0 Pain Management and Medication Current Pain Management: JUDEEN, CASABLANCA ([id number]) [phone].pdf Page 7 of 9 Electronic Signature(s) Signed: 08/01/2023 4:51:45 PM By: Angelina Pih Entered By: Angelina Pih on 08/01/2023 05:30:57 -------------------------------------------------------------------------------- Patient/Caregiver Education Details Patient Name: Date of Service: [name] 9/23/2024andnbsp8:30 A M Medical Record Number: [id number] Patient Account Number: [id number] Date of Birth/Gender: Treating RN: Nov 24, 1945 (77 y.o. [name] Primary [name] Physician: [name] Other Clinician: Referring Physician: Treating Physician/Extender: [name] in Treatment: 14 Education Assessment Education Provided To: Patient Education Topics Provided Wound/Skin Impairment: Handouts: Caring for Your Ulcer Methods: Explain/Verbal Responses: State content correctly Electronic Signature(s) Signed: 08/01/2023 4:51:45 PM By: Angelina Pih Entered By: Angelina Pih on 08/01/2023 05:53:12 -------------------------------------------------------------------------------- Wound Assessment

## 2023-08-01 NOTE — Progress Notes (Addendum)
recommend as well that the patient should continue to monitor for any evidence of infection or worsening. Obviously if anything changes she knows to let me know otherwise we will get a  plan to see her in 2 weeks at this point as she will be going out of town. We will see patient back for reevaluation in 1 week here in the clinic. If anything worsens or changes patient will contact our office for additional recommendations. Electronic Signature(s) Signed: 08/01/2023 8:50:58 AM By: Allen Derry PA-C Entered By: Allen Derry on 08/01/2023 05:50:57 -------------------------------------------------------------------------------- SuperBill Details Patient Name: Date of Service: [name], [name] 08/01/2023 Medical Record Number: [id number] Patient Account Number: [id number] Date of Birth/Sex: Treating RN: [birth year] (77 y.o. 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.622 Type 2 diabetes mellitus with other skin ulcer I89.0 Lymphedema, not elsewhere classified L97.822 Non-pressure chronic ulcer of other part of left lower leg with fat layer exposed I10 Essential (primary) hypertension [name], [name] ([id number]) [id number] Page 7 of 7 Facility Procedures : CPT4 Code: 10272536 Description: 99213 - WOUND [name] VISIT-LEV 3 EST PT Modifier: Quantity: 1 Physician Procedures : CPT4 Code Description Modifier [id number] 99213 - WC PHYS LEVEL 3 - EST PT ICD-10 Diagnosis Description I87.332 Chronic venous hypertension (idiopathic) with ulcer and inflammation of left lower extremity E11.622 Type 2 diabetes mellitus with other skin  ulcer I89.0 Lymphedema, not elsewhere classified L97.822 Non-pressure chronic ulcer of other part of left lower leg with fat layer exposed Quantity: 1 Electronic Signature(s) Signed: 08/01/2023 4:51:45 PM By: Angelina Pih Signed: 08/01/2023  4:57:39 PM By: Allen Derry PA-C Previous Signature: 08/01/2023 8:51:11 AM Version By: Allen Derry PA-C Entered By: Angelina Pih on 08/01/2023 05:52:45  recommend as well that the patient should continue to monitor for any evidence of infection or worsening. Obviously if anything changes she knows to let me know otherwise we will get a  plan to see her in 2 weeks at this point as she will be going out of town. We will see patient back for reevaluation in 1 week here in the clinic. If anything worsens or changes patient will contact our office for additional recommendations. Electronic Signature(s) Signed: 08/01/2023 8:50:58 AM By: Allen Derry PA-C Entered By: Allen Derry on 08/01/2023 05:50:57 -------------------------------------------------------------------------------- SuperBill Details Patient Name: Date of Service: [name], [name] 08/01/2023 Medical Record Number: [id number] Patient Account Number: [id number] Date of Birth/Sex: Treating RN: [birth year] (77 y.o. 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.622 Type 2 diabetes mellitus with other skin ulcer I89.0 Lymphedema, not elsewhere classified L97.822 Non-pressure chronic ulcer of other part of left lower leg with fat layer exposed I10 Essential (primary) hypertension [name], [name] ([id number]) [id number] Page 7 of 7 Facility Procedures : CPT4 Code: 10272536 Description: 99213 - WOUND [name] VISIT-LEV 3 EST PT Modifier: Quantity: 1 Physician Procedures : CPT4 Code Description Modifier [id number] 99213 - WC PHYS LEVEL 3 - EST PT ICD-10 Diagnosis Description I87.332 Chronic venous hypertension (idiopathic) with ulcer and inflammation of left lower extremity E11.622 Type 2 diabetes mellitus with other skin  ulcer I89.0 Lymphedema, not elsewhere classified L97.822 Non-pressure chronic ulcer of other part of left lower leg with fat layer exposed Quantity: 1 Electronic Signature(s) Signed: 08/01/2023 4:51:45 PM By: Angelina Pih Signed: 08/01/2023  4:57:39 PM By: Allen Derry PA-C Previous Signature: 08/01/2023 8:51:11 AM Version By: Allen Derry PA-C Entered By: Angelina Pih on 08/01/2023 05:52:45  recommend as well that the patient should continue to monitor for any evidence of infection or worsening. Obviously if anything changes she knows to let me know otherwise we will get a  plan to see her in 2 weeks at this point as she will be going out of town. We will see patient back for reevaluation in 1 week here in the clinic. If anything worsens or changes patient will contact our office for additional recommendations. Electronic Signature(s) Signed: 08/01/2023 8:50:58 AM By: Allen Derry PA-C Entered By: Allen Derry on 08/01/2023 05:50:57 -------------------------------------------------------------------------------- SuperBill Details Patient Name: Date of Service: [name], [name] 08/01/2023 Medical Record Number: [id number] Patient Account Number: [id number] Date of Birth/Sex: Treating RN: [birth year] (77 y.o. 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.622 Type 2 diabetes mellitus with other skin ulcer I89.0 Lymphedema, not elsewhere classified L97.822 Non-pressure chronic ulcer of other part of left lower leg with fat layer exposed I10 Essential (primary) hypertension [name], [name] ([id number]) [id number] Page 7 of 7 Facility Procedures : CPT4 Code: 10272536 Description: 99213 - WOUND [name] VISIT-LEV 3 EST PT Modifier: Quantity: 1 Physician Procedures : CPT4 Code Description Modifier [id number] 99213 - WC PHYS LEVEL 3 - EST PT ICD-10 Diagnosis Description I87.332 Chronic venous hypertension (idiopathic) with ulcer and inflammation of left lower extremity E11.622 Type 2 diabetes mellitus with other skin  ulcer I89.0 Lymphedema, not elsewhere classified L97.822 Non-pressure chronic ulcer of other part of left lower leg with fat layer exposed Quantity: 1 Electronic Signature(s) Signed: 08/01/2023 4:51:45 PM By: Angelina Pih Signed: 08/01/2023  4:57:39 PM By: Allen Derry PA-C Previous Signature: 08/01/2023 8:51:11 AM Version By: Allen Derry PA-C Entered By: Angelina Pih on 08/01/2023 05:52:45  recommend as well that the patient should continue to monitor for any evidence of infection or worsening. Obviously if anything changes she knows to let me know otherwise we will get a  plan to see her in 2 weeks at this point as she will be going out of town. We will see patient back for reevaluation in 1 week here in the clinic. If anything worsens or changes patient will contact our office for additional recommendations. Electronic Signature(s) Signed: 08/01/2023 8:50:58 AM By: Allen Derry PA-C Entered By: Allen Derry on 08/01/2023 05:50:57 -------------------------------------------------------------------------------- SuperBill Details Patient Name: Date of Service: [name], [name] 08/01/2023 Medical Record Number: [id number] Patient Account Number: [id number] Date of Birth/Sex: Treating RN: [birth year] (77 y.o. 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.622 Type 2 diabetes mellitus with other skin ulcer I89.0 Lymphedema, not elsewhere classified L97.822 Non-pressure chronic ulcer of other part of left lower leg with fat layer exposed I10 Essential (primary) hypertension [name], [name] ([id number]) [id number] Page 7 of 7 Facility Procedures : CPT4 Code: 10272536 Description: 99213 - WOUND [name] VISIT-LEV 3 EST PT Modifier: Quantity: 1 Physician Procedures : CPT4 Code Description Modifier [id number] 99213 - WC PHYS LEVEL 3 - EST PT ICD-10 Diagnosis Description I87.332 Chronic venous hypertension (idiopathic) with ulcer and inflammation of left lower extremity E11.622 Type 2 diabetes mellitus with other skin  ulcer I89.0 Lymphedema, not elsewhere classified L97.822 Non-pressure chronic ulcer of other part of left lower leg with fat layer exposed Quantity: 1 Electronic Signature(s) Signed: 08/01/2023 4:51:45 PM By: Angelina Pih Signed: 08/01/2023  4:57:39 PM By: Allen Derry PA-C Previous Signature: 08/01/2023 8:51:11 AM Version By: Allen Derry PA-C Entered By: Angelina Pih on 08/01/2023 05:52:45  she is moving in the right direction here. Electronic Signature(s) Signed: 08/01/2023 8:50:20 AM By: Allen Derry PA-C Entered By: Allen Derry on 08/01/2023  05:50:20 Mcmorris, [name] ([id number]) [id number] Page 3 of 7 -------------------------------------------------------------------------------- Physician Orders Details Patient Name: Date of Service: [name], [name] 08/01/2023 8:30 A M Medical Record Number: [id number] Patient Account Number: [id number] Date of Birth/Sex: Treating RN: 1946-04-26 (77 y.o. 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 Bathing/ Shower/ Hygiene May shower; gently cleanse wound with antibacterial soap, rinse and pat dry prior to dressing wounds Edema Control - Lymphedema / Segmental Compressive Device / Other Patient to wear own compression stockings. Remove compression stockings every night before going to bed and put on every morning when getting up. - bi lat Elevate, Exercise Daily and A void Standing for Long Periods of Time. Elevate legs to the level of the heart and pump ankles as often as possible Elevate leg(s) parallel to the floor when sitting. Wound Treatment Wound #1 - Lower Leg Wound Laterality: Left, Lateral, Distal Cleanser: [employer]ram Ancillary Kit - 15 Day Supply (Generic) 3 x Per Week/30 Days Discharge Instructions: Use supplies as instructed; Kit contains: (15) Saline Bullets; (15) 3x3 Gauze; 15 pr Gloves Prim Dressing: Vaseline Impregnated Gauze Dressing, 3x9 (in/in) ary 3 x Per  Week/30 Days Secondary Dressing: (BORDER) Zetuvit Plus SILICONE BORDER Dressing 4x4 (in/in) 3 x Per Week/30 Days Discharge Instructions: Please do not put silicone bordered dressings under wraps. Use non-bordered dressing only. Electronic Signature(s) Signed: 08/01/2023 4:51:45 PM By: Angelina Pih Signed: 08/01/2023 4:57:39 PM By: Allen Derry PA-C Entered By: Angelina Pih on 08/01/2023 05:51:00 [name] ([id number]) [id number] Page 4 of 7 -------------------------------------------------------------------------------- Problem List Details Patient Name: Date of Service: [name], [name] 08/01/2023 8:30 A M Medical Record Number: [id number] Patient Account Number: [id number] Date of Birth/Sex: Treating RN: 1946-11-01 (77 y.o. [name] Primary [name] Provider: [name] Other Clinician: Referring Provider: Treating Provider/Extender: [name] in Treatment: 14 Active Problems ICD-10 Encounter Code Description Active Date MDM Diagnosis I87.332 Chronic venous hypertension (idiopathic) with ulcer and inflammation of left 04/22/2023 No Yes lower extremity E11.622 Type 2 diabetes mellitus with other skin ulcer 04/22/2023 No Yes I89.0 Lymphedema, not elsewhere classified 04/22/2023 No Yes L97.822 Non-pressure chronic ulcer of other part of left lower leg with fat layer exposed6/14/2024 No Yes I10 Essential (primary) hypertension 04/22/2023 No Yes Inactive Problems Resolved Problems Electronic Signature(s) Signed: 08/01/2023 8:43:54 AM By: Allen Derry PA-C Entered By: Allen Derry on 08/01/2023 05:43:53 -------------------------------------------------------------------------------- Progress Note Details Patient Name: Date of Service: Christine Saxon J. 08/01/2023 8:30 A M Medical Record Number: [id number] Patient Account Number: [id number] Date of Birth/Sex: Treating RN: Mar 18, 1946 (77 y.o. [name], [name], Christine J ([id number]) [id number].pdf Page 5 of 7 Primary [name] Provider: [name] Other Clinician: Referring Provider: Treating Provider/Extender: [name] in Treatment: 14 Subjective Chief Complaint Information obtained from Patient Left LE Ulcer History of Present Illness (HPI) 04-22-2023 upon evaluation patient appears to be doing somewhat  recommend as well that the patient should continue to monitor for any evidence of infection or worsening. Obviously if anything changes she knows to let me know otherwise we will get a  plan to see her in 2 weeks at this point as she will be going out of town. We will see patient back for reevaluation in 1 week here in the clinic. If anything worsens or changes patient will contact our office for additional recommendations. Electronic Signature(s) Signed: 08/01/2023 8:50:58 AM By: Allen Derry PA-C Entered By: Allen Derry on 08/01/2023 05:50:57 -------------------------------------------------------------------------------- SuperBill Details Patient Name: Date of Service: [name], [name] 08/01/2023 Medical Record Number: [id number] Patient Account Number: [id number] Date of Birth/Sex: Treating RN: [birth year] (77 y.o. 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.622 Type 2 diabetes mellitus with other skin ulcer I89.0 Lymphedema, not elsewhere classified L97.822 Non-pressure chronic ulcer of other part of left lower leg with fat layer exposed I10 Essential (primary) hypertension [name], [name] ([id number]) [id number] Page 7 of 7 Facility Procedures : CPT4 Code: 10272536 Description: 99213 - WOUND [name] VISIT-LEV 3 EST PT Modifier: Quantity: 1 Physician Procedures : CPT4 Code Description Modifier [id number] 99213 - WC PHYS LEVEL 3 - EST PT ICD-10 Diagnosis Description I87.332 Chronic venous hypertension (idiopathic) with ulcer and inflammation of left lower extremity E11.622 Type 2 diabetes mellitus with other skin  ulcer I89.0 Lymphedema, not elsewhere classified L97.822 Non-pressure chronic ulcer of other part of left lower leg with fat layer exposed Quantity: 1 Electronic Signature(s) Signed: 08/01/2023 4:51:45 PM By: Angelina Pih Signed: 08/01/2023  4:57:39 PM By: Allen Derry PA-C Previous Signature: 08/01/2023 8:51:11 AM Version By: Allen Derry PA-C Entered By: Angelina Pih on 08/01/2023 05:52:45

## 2023-08-15 ENCOUNTER — Encounter: Payer: Federal, State, Local not specified - PPO | Attending: Physician Assistant | Admitting: Physician Assistant

## 2023-08-15 DIAGNOSIS — I890 Lymphedema, not elsewhere classified: Secondary | ICD-10-CM | POA: Insufficient documentation

## 2023-08-15 DIAGNOSIS — E11622 Type 2 diabetes mellitus with other skin ulcer: Secondary | ICD-10-CM | POA: Diagnosis not present

## 2023-08-15 DIAGNOSIS — I872 Venous insufficiency (chronic) (peripheral): Secondary | ICD-10-CM | POA: Insufficient documentation

## 2023-08-15 DIAGNOSIS — I10 Essential (primary) hypertension: Secondary | ICD-10-CM | POA: Insufficient documentation

## 2023-08-15 DIAGNOSIS — E11621 Type 2 diabetes mellitus with foot ulcer: Secondary | ICD-10-CM | POA: Diagnosis present

## 2023-08-15 DIAGNOSIS — L97822 Non-pressure chronic ulcer of other part of left lower leg with fat layer exposed: Secondary | ICD-10-CM | POA: Insufficient documentation

## 2023-08-15 NOTE — Progress Notes (Addendum)
[name] ([id number]) [id number] Page 1 of 9 Visit Report for 08/15/2023 Arrival Information Details Patient Name: Date of Service: [name], [name] 08/15/2023 8:45 A M Medical Record Number: [id number] Patient Account Number: [id number] Date of Birth/Sex: Treating RN: [birth year] (77 y.o. [name] Primary [name] Makinzey Banes: [name] Other Clinician: Referring Chantale Leugers: Treating Naelle Diegel/Extender: [name] in Treatment: 16 Visit Information History Since Last Visit Added or deleted any medications: No Patient Arrived: Ambulatory Any new allergies or adverse reactions: No Arrival Time: 08:46 Had a fall or experienced change in No Accompanied By: self activities of daily living that may affect Transfer Assistance: None risk of falls: Patient Identification Verified: Yes Signs or symptoms of abuse/neglect since last visito No Secondary Verification Process Completed: Yes Hospitalized since last visit: No Patient Requires Transmission-Based Precautions: No Implantable device outside of the clinic excluding No Patient Has Alerts: No cellular tissue based products placed in the center since last visit: Has Dressing in Place as Prescribed: Yes Has Compression in Place as Prescribed: Yes Pain Present Now: No Electronic Signature(s) Signed: 09/05/2023 8:01:26 AM By: [name] Entered By: Yevonne Pax on 08/15/2023 05:47:02 -------------------------------------------------------------------------------- Clinic Level of [name] Assessment Details Patient Name: Date of Service: [name] 08/15/2023 8:45 A M Medical Record Number: [id number] Patient Account Number: [id number] Date of Birth/Sex: Treating RN: 10-Nov-1945 (77 y.o. [name] Primary [name] Zahira Brummond: [name] Other Clinician: Referring Moncia Annas: Treating Ellinore Merced/Extender: [name] in Treatment:  16 Clinic Level of [name] Assessment Items TOOL 4 Quantity Score X- 1 0 Use when only an EandM is performed on FOLLOW-UP visit ASSESSMENTS - Nursing Assessment / Reassessment X- 1 10 Reassessment of Co-morbidities (includes updates in patient status) X- 1 5 Reassessment of Adherence to Treatment Plan [name], [name] ([id number]) [id number] Page 2 of 9 ASSESSMENTS - Wound and Skin A ssessment / Reassessment X - Simple Wound Assessment / Reassessment - one wound 1 5 []  - 0 Complex Wound Assessment / Reassessment - multiple wounds []  - 0 Dermatologic / Skin Assessment (not related to wound area) ASSESSMENTS - Focused Assessment []  - 0 Circumferential Edema Measurements - multi extremities []  - 0 Nutritional Assessment / Counseling / Intervention []  - 0 Lower Extremity Assessment (monofilament, tuning fork, pulses) []  - 0 Peripheral Arterial Disease Assessment (using hand held doppler) ASSESSMENTS - Ostomy and/or Continence Assessment and [name] []  - 0 Incontinence Assessment and Management []  - 0 Ostomy [name] Assessment and Management (repouching, etc.) PROCESS - Coordination of [name] X - Simple Patient / Family Education for ongoing [name] 1 15 []  - 0 Complex (extensive) Patient / Family Education for ongoing [name] []  - 0 Staff obtains Chiropractor, Records, T Results / Process Orders est []  - 0 Staff telephones HHA, Nursing Homes / Clarify orders / etc []  - 0 Routine Transfer to another Facility (non-emergent condition) []  - 0 Routine Hospital Admission (non-emergent condition) []  - 0 New Admissions / Manufacturing engineer / Ordering NPWT Apligraf, etc. , []  - 0 Emergency Hospital Admission (emergent condition) X- 1 10 Simple Discharge Coordination []  - 0 Complex (extensive) Discharge Coordination PROCESS - Special Needs []  - 0 Pediatric / Minor Patient Management []  - 0 Isolation Patient Management []  - 0 Hearing / Language / Visual special  needs []  - 0 Assessment of Community assistance (transportation, D/C planning, etc.) []  - 0 Additional assistance / Altered mentation []  - 0 Support Surface(s) Assessment (bed, cushion, seat, etc.) INTERVENTIONS - Wound  Cleansing / Measurement X - Simple Wound Cleansing - one wound 1 5 []  - 0 Complex Wound Cleansing - multiple wounds X- 1 5 Wound Imaging (photographs - any number of wounds) []  - 0 Wound Tracing (instead of photographs) X- 1 5 Simple Wound Measurement - one wound []  - 0 Complex Wound Measurement - multiple wounds INTERVENTIONS - Wound Dressings X - Small Wound Dressing one or multiple wounds 1 10 []  - 0 Medium Wound Dressing one or multiple wounds []  - 0 Large Wound Dressing one or multiple wounds []  - 0 Application of Medications - topical []  - 0 Application of Medications - injection INTERVENTIONS - Miscellaneous []  - 0 External ear exam [name], [name] ([id number]) [phone].pdf Page 3 of 9 []  - 0 Specimen Collection (cultures, biopsies, blood, body fluids, etc.) []  - 0 Specimen(s) / Culture(s) sent or taken to Lab for analysis []  - 0 Patient Transfer (multiple staff / Michiel Sites Lift / Similar devices) []  - 0 Simple Staple / Suture removal (25 or less) []  - 0 Complex Staple / Suture removal (26 or more) []  - 0 Hypo / Hyperglycemic Management (close monitor of Blood Glucose) []  - 0 Ankle / Brachial Index (ABI) - do not check if billed separately X- 1 5 Vital Signs Has the patient been seen at the hospital within the last three years: Yes Total Score: 75 Level Of [name]: New/Established - Level 2 Electronic Signature(s) Signed: 09/05/2023 8:01:26 AM By: [name] Entered By: Yevonne Pax on 08/15/2023 06:10:47 -------------------------------------------------------------------------------- Encounter Discharge Information Details Patient Name: Date of Service: [name], [name] J. 08/15/2023 8:45 A M Medical Record  Number: [id number] Patient Account Number: [id number] Date of Birth/Sex: Treating RN: 1946-10-04 (77 y.o. [name] Primary [name] Myesha Stillion: [name] Other Clinician: Referring Luceal Hollibaugh: Treating Ruperto Kiernan/Extender: [name] in Treatment: 16 Encounter Discharge Information Items Discharge Condition: Stable Ambulatory Status: Ambulatory Discharge Destination: Home Transportation: Private Auto Accompanied By: self Schedule Follow-up Appointment: Yes Clinical Summary of [name]: Electronic Signature(s) Signed: 08/15/2023 9:14:09 AM By: [name] Entered By: Yevonne Pax on 08/15/2023 06:14:09 Lower Extremity Assessment Details -------------------------------------------------------------------------------- [name] ([id number]) [phone].pdf Page 4 of 9 Patient Name: Date of Service: COBY, BARTOLI 08/15/2023 8:45 A M Medical Record Number: [id number] Patient Account Number: [id number] Date of Birth/Sex: Treating RN: 03/21/1946 (77 y.o. [name] Primary [name] Glennis Montenegro: [name] Other Clinician: Referring Rasheem Figiel: Treating Awanda Wilcock/Extender: [name] in Treatment: 16 Edema Assessment Left: Right: Assessed: No No Edema: Yes Calf Left: Right: Point of Measurement: 34 cm From Medial Instep 39 cm Ankle Left: Right: Point of Measurement: 12 cm From Medial Instep 23 cm Vascular Assessment Left: Right: Pulses: Dorsalis Pedis Palpable: Yes Extremity colors, hair growth, and conditions: Extremity Color: Hyperpigmented Hair Growth on Extremity: No Temperature of Extremity: Warm Capillary Refill: < 3 seconds Dependent Rubor: No Blanched when Elevated: No Lipodermatosclerosis: No Toe Nail Assessment Left: Right: Thick: No Discolored: No Deformed: No Improper Length and Hygiene: No Electronic Signature(s) Signed: 09/05/2023 8:01:26 AM By: Yevonne Pax  RN Entered By: Yevonne Pax on 08/15/2023 05:51:28 -------------------------------------------------------------------------------- Multi Wound Chart Details Patient Name: Date of Service: [name], [name] J. 08/15/2023 8:45 A M Medical Record Number: [id number] Patient Account Number: [id number] Date of Birth/Sex: Treating RN: [birth year] (77 y.o. [name] Primary [name] Alyus Mofield: [name] Other Clinician: Referring Druanne Bosques: Treating Jessyca Sloan/Extender: [name] in Treatment: 16 Vital Signs Height(in): 68 Pulse(bpm): 63 Weight(lbs): 200  Blood Pressure(mmHg): 146/66 Body Mass Index(BMI): 30.4 Temperature(F): 98.7 [place]wingerden, Summer J ([id number]) [phone].pdf Page 5 of 9 Respiratory Rate(breaths/min): 18 [1:Photos:] [N/A:N/A] Left, Distal, Lateral Lower Leg N/A N/A Wound Location: Trauma N/A N/A Wounding Event: Diabetic Wound/Ulcer of the Lower N/A N/A Primary Etiology: Extremity Lymphedema, Hypertension, Type II N/A N/A Comorbid History: Diabetes 02/07/2023 N/A N/A Date Acquired: 16 N/A N/A Weeks of Treatment: Open N/A N/A Wound Status: No N/A N/A Wound Recurrence: 0.3x0.4x0.1 N/A N/A Measurements L x W x D (cm) 0.094 N/A N/A A (cm) : rea 0.009 N/A N/A Volume (cm) : 73.40% N/A N/A % Reduction in A rea: 91.50% N/A N/A % Reduction in Volume: Grade 1 N/A N/A Classification: Medium N/A N/A Exudate A mount: Serosanguineous N/A N/A Exudate Type: red, brown N/A N/A Exudate Color: None Present (0%) N/A N/A Granulation A mount: Large (67-100%) N/A N/A Necrotic A mount: Fat Layer (Subcutaneous Tissue): Yes N/A N/A Exposed Structures: Fascia: No Tendon: No Muscle: No Joint: No Bone: No Small (1-33%) N/A N/A Epithelialization: Treatment Notes Electronic Signature(s) Signed: 09/05/2023 8:01:26 AM By: [name] Entered By: Yevonne Pax on 08/15/2023  05:51:35 -------------------------------------------------------------------------------- Multi-Disciplinary [name] Plan Details Patient Name: Date of Service: [name], [name] J. 08/15/2023 8:45 A M Medical Record Number: [id number] Patient Account Number: [id number] Date of Birth/Sex: Treating RN: 1945-12-10 (77 y.o. [name] Primary [name] Teigan Sahli: [name] Other Clinician: Referring Rigby Leonhardt: Treating Aryanah Enslow/Extender: [name] in Treatment: [address] [name], [name] ([id number]) [id number] Page 6 of 9 Nursing Diagnoses: Knowledge deficit related to ulceration/compromised skin integrity Goals: Patient/caregiver will verbalize understanding of skin [name] regimen Date Initiated: 04/22/2023 Target Resolution Date: 08/22/2023 Goal Status: Active Ulcer/skin breakdown will have a volume reduction of 30% by week 4 Date Initiated: 04/22/2023 Date Inactivated: 06/27/2023 Target Resolution Date: 05/22/2023 Goal Status: Unmet Unmet Reason: comorbidities Ulcer/skin breakdown will have a volume reduction of 50% by week 8 Date Initiated: 04/22/2023 Date Inactivated: 06/27/2023 Target Resolution Date: 06/22/2023 Goal Status: Unmet Unmet Reason: comorbidities Ulcer/skin breakdown will have a volume reduction of 80% by week 12 Date Initiated: 04/22/2023 Date Inactivated: 07/25/2023 Target Resolution Date: 07/23/2023 Goal Status: Unmet Unmet Reason: comorbidities Ulcer/skin breakdown will heal within 14 weeks Date Initiated: 04/22/2023 Target Resolution Date: 08/22/2023 Goal Status: Active Interventions: Assess patient/caregiver ability to obtain necessary supplies Assess patient/caregiver ability to perform ulcer/skin [name] regimen upon admission and as needed Assess ulceration(s) every visit Notes: Electronic Signature(s) Signed: 08/15/2023 9:11:03 AM By: [name] Entered By: Yevonne Pax on 08/15/2023 06:11:03 -------------------------------------------------------------------------------- Pain Assessment Details Patient Name: Date of Service: ASLYN, ARAKI 08/15/2023 8:45 A M Medical Record Number: [id number] Patient Account Number: [id number] Date of Birth/Sex: Treating RN: January 11, 1946 (77 y.o. [name] Primary [name] Toshiko Kemler: [name] Other Clinician: Referring Kaeya Schiffer: Treating Alfredia Desanctis/Extender: [name] in Treatment: 16 Active Problems Location of Pain Severity and Description of Pain Patient Has Paino No Site Locations [place], [place] J ([id number]) [id number] Page 7 of 9 Pain Management and Medication Current Pain Management: Electronic Signature(s) Signed: 09/05/2023 8:01:26 AM By: [name] Entered By: Yevonne Pax on 08/15/2023 05:47:35 -------------------------------------------------------------------------------- Patient/Caregiver Education Details Patient Name: Date of Service: [name] 10/7/2024andnbsp8:45 A M Medical Record Number: [id number] Patient Account Number: [id number] Date of Birth/Gender: Treating RN: 1946/04/24 (77 y.o. [name] Primary [name] Physician: [name] Other Clinician: Referring Physician: Treating Physician/Extender: [name] in Treatment: 16 Education Assessment Education Provided  To: Patient Education Topics Provided Wound/Skin Impairment: Handouts: Caring for Your Ulcer Methods: Explain/Verbal Responses: State content correctly Electronic Signature(s) Signed: 09/05/2023 8:01:26 AM By: [name] Entered By: Yevonne Pax on 08/15/2023 06:11:33 -------------------------------------------------------------------------------- Wound Assessment Details Patient Name: Date of Service: [name], [name] 08/15/2023 8:45 A M Medical Record Number: [id number] Patient Account  Number: [id number] Date of Birth/Sex: Treating RN: 01-Jan-1946 (77 y.o. [name] Primary [name] Saphira Lahmann: [name] Other Clinician: Referring Amyjo Mizrachi: Treating Avraj Lindroth/Extender: [name] in Treatment: [address] [name], [name] ([id number]) [id number] Page 8 of 9 Wound Number: 1 Primary Etiology: Diabetic Wound/Ulcer of the Lower Extremity Wound Location: Left, Distal, Lateral Lower Leg Wound Status: Open Wounding Event: Trauma Comorbid History: Lymphedema, Hypertension, Type II Diabetes Date Acquired: 02/07/2023 Weeks Of Treatment: 16 Clustered Wound: No Photos Wound Measurements Length: (cm) 0.3 Width: (cm) 0.4 Depth: (cm) 0.1 Area: (cm) 0.094 Volume: (cm) 0.009 % Reduction in Area: 73.4% % Reduction in Volume: 91.5% Epithelialization: Small (1-33%) Tunneling: No Undermining: No Wound Description Classification: Grade 1 Exudate Amount: Medium Exudate Type: Serosanguineous Exudate Color: red, brown Foul Odor After Cleansing: No Slough/Fibrino Yes Wound Bed Granulation Amount: None Present (0%) Exposed Structure Necrotic Amount: Large (67-100%) Fascia Exposed: No Necrotic Quality: Adherent Slough Fat Layer (Subcutaneous Tissue) Exposed: Yes Tendon Exposed: No Muscle Exposed: No Joint Exposed: No Bone Exposed: No Treatment Notes Wound #1 (Lower Leg) Wound Laterality: Left, Lateral, Distal Cleanser [place] Ancillary Kit - 15 Day Supply Discharge Instruction: Use supplies as instructed; Kit contains: (15) Saline Bullets; (15) 3x3 Gauze; 15 pr Gloves Peri-Wound [name] Topical Primary Dressing Vaseline Impregnated Gauze Dressing, 3x9 (in/in) Secondary Dressing (BORDER) Zetuvit Plus SILICONE BORDER Dressing 4x4 (in/in) Discharge Instruction: Please do not put silicone bordered dressings under wraps. Use non-bordered dressing only. Secured With Compression Wrap Compression  Stockings Add-Ons Electronic Signature(s) Signed: 09/05/2023 8:01:26 AM By: [name] Entered By: Yevonne Pax on 08/15/2023 05:49:56 [name] ([id number]) [id number] Page 9 of 9 -------------------------------------------------------------------------------- Vitals Details Patient Name: Date of Service: [name], [name] 08/15/2023 8:45 A M Medical Record Number: [id number] Patient Account Number: [id number] Date of Birth/Sex: Treating RN: [birth year] (77 y.o. [name] Primary [name] Ranelle Auker: [name] Other Clinician: Referring Adaliah Hiegel: Treating Jae Skeet/Extender: [name] in Treatment: 16 Vital Signs Time Taken: 08:47 Temperature (F): 98.7 Height (in): 68 Pulse (bpm): 63 Weight (lbs): 200 Respiratory Rate (breaths/min): 18 Body Mass Index (BMI): 30.4 Blood Pressure (mmHg): 146/66 Reference Range: 80 - 120 mg / dl Electronic Signature(s) Signed: 09/05/2023 8:01:26 AM By: [name] Entered By: Yevonne Pax on 08/15/2023 05:47:25

## 2023-08-15 NOTE — Progress Notes (Addendum)
KELCE, BOUTON ([id number]) [id number] Page 1 of 7 Visit Report for 08/15/2023 Chief Complaint Document Details Patient Name: Date of Service: [name], [name] 08/15/2023 8:45 A M Medical Record Number: [id number] Patient Account Number: [id number] Date of Birth/Sex: Treating RN: [birth year] (77 y.o. [name] Primary [name] Provider: [name] Other Clinician: Referring Provider: Treating Provider/Extender: [name] in Treatment: 16 Information Obtained from: Patient Chief Complaint Left LE Ulcer Electronic Signature(s) Signed: 08/15/2023 8:43:10 AM By: Allen Derry PA-C Entered By: Allen Derry on 08/15/2023 05:43:09 -------------------------------------------------------------------------------- HPI Details Patient Name: Date of Service: [name]. 08/15/2023 8:45 A M Medical Record Number: [id number] Patient Account Number: [id number] Date of Birth/Sex: Treating RN: 15-Jul-1946 (77 y.o. [name] Primary [name] Provider: [name] Other Clinician: Referring Provider: Treating Provider/Extender: [name] in Treatment: 16 History of Present Illness HPI Description: 04-22-2023 upon evaluation patient appears to be doing somewhat poorly in regard to a wound on the left distal/lateral lower extremity and about the ankle region. She tells me this occurred on February 07, 2023 as a result of trying to get a very tight and new compression sock off. It was getting stuck and she somewhat twisted trying to get it off and pulled skin off. Since that time she has been having a hard time getting this to heal. Fortunately there does not appear to be any signs of active infection which is great news. She did have an ABI of 0.75 today. With that being said I am actually decently pleased with how the wound looks it does have some necrotic tissue but there is going to need to be some sharp  debridement to clear away some of the dead tissue as well today. Patient does have a history of diabetes mellitus type 2, lymphedema, hypertension, and chronic venous insufficiency. 05-02-2023 upon evaluation today patient appears to be doing well currently in regard to her wound. She is actually showing signs of improvement and very pleased in that regard and fortunately I do not see any signs of active infection locally or systemically at this time which is great news. No fevers, chills, nausea, vomiting, or diarrhea. 05-09-2023 upon evaluation today patient appears to be doing well currently in regard to her wound is not worse but is also not better. I discussed with her again today that we will get a need to likely compression wrap for her. With that being said she is adamantly opposed to this and wants to get 1 more week given something to try and I recommend to be seen by double layer Tubigrip to see if this can be beneficial. [name], [name] ([id number]) [id number] Page 2 of 7 05-17-2023 upon evaluation today patient appears to be doing well currently in regard to her wound although it still very inflamed on the posterior aspect of her ankle. I discussed that with her today. With that being said I do not see any evidence of general worsening which is great news but also do not see a significant amount of improvement compared to last week in my opinion. I discussed this with her at this point today. 05-23-2023 upon evaluation today patient appears to be doing well currently in regard to her wound. This is actually showing some slough and biofilm on the surface of the wound. The good news is I do believe that we cleaned some of the soften the scar ischial little bit of improvement with that being said the periwound is being  very irritated I think the Vashe may have irritated her based on what she is telling me. Fortunately I do not see any evidence of active infection  locally nor systemically at this time which is great news. No fevers, chills, nausea, vomiting, or diarrhea. 7/22; wound on the left lateral lower leg in the setting of significant chronic venous hypertension. We have been using Prisma. She would not allow a compression wrap although she is using a support stocking. 06-06-2023 upon evaluation today patient's wound actually showing signs of significant improvement with regard to her wound. She has been tolerating the dressing changes without complication. Fortunately there does not appear to be any signs of active infection locally nor systemically at this time which is great news. No fevers, chills, nausea, vomiting, or diarrhea. 06-20-2023 upon evaluation today patient appears to be doing well currently in regard to her wound which is actually showing signs of significant improvement. Fortunately I do not see any evidence of worsening infection I do believe that the patient is moving in the right direction which is great news and in general I think that we are on the right track here. 06-27-2023 upon evaluation today patient appears to be doing well currently in regard to her wound. She has been tolerating the dressing changes without complication and in general I do feel like there were making really good headway towards complete closure. I do not see any signs of active infection locally or systemically at this time. 07-14-2023 upon evaluation today patient appears to be doing well currently in regard to her wound. She has been tolerating the dressing changes without complication. Fortunately I do not see any signs of active infection at this time which is great news. No fevers, chills, nausea, vomiting, or diarrhea. 07-19-2023 upon evaluation today patient appears to be doing well currently in regard to her wound although is not significantly smaller. I do think making a change in shift in the dressing would be a good idea here. My suggestion is gena be  that we go ahead and see about doing a Xeroform gauze dressing to see how this will do she is in agreement with that plan. 07-25-2023 upon evaluation today patient appears to be doing well currently in regard to her wound although she tells me the medicine burns pretty much all week. The visual appearance of the wound is significantly improved compared to what it was previous which is good news. 08-01-2023 upon evaluation today patient's wound actually appears to be showing signs of excellent improvement. Fortunately I do not see any signs of active infection locally or systemically which is great news I actually feel like the wound is measuring smaller this looks much better. 08-15-2023 upon evaluation today patient appears to be doing well currently in regard to her wound. She has been tolerating the dressing changes she has been using Vaseline or Vaseline gauze and this seems to be doing quite well. Fortunately I do not see any evidence of worsening overall and I feel like the patient is making good headway towards complete closure. Electronic Signature(s) Signed: 08/15/2023 9:03:32 AM By: Allen Derry PA-C Entered By: Allen Derry on 08/15/2023 06:03:31 -------------------------------------------------------------------------------- Physical Exam Details Patient Name: Date of Service: [name], [name] 08/15/2023 8:45 A M Medical Record Number: [id number] Patient Account Number: [id number] Date of Birth/Sex: Treating RN: 1946/03/10 (77 y.o. [name] Primary [name] Provider: [name] Other Clinician: Referring Provider: Treating Provider/Extender: [name] in Treatment: 16 Constitutional Well-nourished and  well-hydrated in no acute distress. Respiratory normal breathing without difficulty. Psychiatric this patient is able to make decisions and demonstrates good insight into disease process. Alert and Oriented x 3. pleasant and  cooperative. Notes Upon inspection patient's wound bed actually showed signs of good granulation and epithelization at this point. Fortunately I do not see any evidence of worsening overall and I believe that the patient is making good headway towards complete closure. I believe that the Vaseline gauze and/or Vaseline applied over top of her dressing is doing a really good job basically it was drying out too much as far as the wound was concerned and this is keeping it moist allowing it to heal appropriately I am very pleased. [name], [name] ([id number]) [id number] Page 3 of 7 Electronic Signature(s) Signed: 08/15/2023 9:04:10 AM By: Allen Derry PA-C Entered By: Allen Derry on 08/15/2023 06:04:10 -------------------------------------------------------------------------------- Physician Orders Details Patient Name: Date of Service: [name]. 08/15/2023 8:45 A M Medical Record Number: [id number] Patient Account Number: [id number] Date of Birth/Sex: Treating RN: 04-17-1946 (77 y.o. [name] Primary [name] Provider: [name] Other Clinician: Referring Provider: Treating Provider/Extender: [name] in Treatment: 16 Verbal / Phone Orders: No Diagnosis Coding ICD-10 Coding Code Description [phone] Chronic venous hypertension (idiopathic) with ulcer and inflammation of left lower extremity E11.622 Type 2 diabetes mellitus with other skin ulcer I89.0 Lymphedema, not elsewhere classified L97.822 Non-pressure chronic ulcer of other part of left lower leg with fat layer exposed I10 Essential (primary) hypertension Follow-up Appointments Return Appointment in 2 weeks. Bathing/ Shower/ Hygiene May shower; gently cleanse wound with antibacterial soap, rinse and pat dry prior to dressing wounds Edema Control - Lymphedema / Segmental Compressive Device / Other Patient to wear own compression stockings. Remove  compression stockings every night before going to bed and put on every morning when getting up. - bi lat Elevate, Exercise Daily and A void Standing for Long Periods of Time. Elevate legs to the level of the heart and pump ankles as often as possible Elevate leg(s) parallel to the floor when sitting. Wound Treatment Wound #1 - Lower Leg Wound Laterality: Left, Lateral, Distal Cleanser: Byram Ancillary Kit - 15 Day Supply (Generic) 3 x Per Week/30 Days Discharge Instructions: Use supplies as instructed; Kit contains: (15) Saline Bullets; (15) 3x3 Gauze; 15 pr Gloves Prim Dressing: Vaseline Impregnated Gauze Dressing, 3x9 (in/in) ary 3 x Per Week/30 Days Secondary Dressing: (BORDER) Zetuvit Plus SILICONE BORDER Dressing 4x4 (in/in) 3 x Per Week/30 Days Discharge Instructions: Please do not put silicone bordered dressings under wraps. Use non-bordered dressing only. Electronic Signature(s) Signed: 08/15/2023 9:10:14 AM By: [name] Signed: 08/15/2023 3:41:41 PM By: Allen Derry PA-C Entered By: Yevonne Pax on 08/15/2023 06:10:14 [name] ([id number]) [id number] Page 4 of 7 -------------------------------------------------------------------------------- Problem List Details Patient Name: Date of Service: [name], [name] 08/15/2023 8:45 A M Medical Record Number: [id number] Patient Account Number: [id number] Date of Birth/Sex: Treating RN: 1946-04-06 (77 y.o. 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 08/15/2023 8:45 A M Medical Record Number: [id number] Patient Account Number: [id number] Date of Birth/Sex: Treating RN: Mar 24, 1946 (77 y.o. La, [place], [place] J ([id number]) [id number] Page 5 of 7 Primary [name] Provider: [name] Other Clinician: Referring Provider: Treating Provider/Extender: [name] in Treatment: 16 Subjective Chief Complaint Information obtained from Patient Left LE Ulcer History of Present Illness (HPI) 04-22-2023 upon evaluation patient appears to be doing somewhat poorly in regard to a wound on the left distal/lateral lower extremity and about the ankle region. She tells me this occurred on February 07, 2023 as a result of trying to get a very tight and new compression sock off. It was getting stuck and she somewhat twisted trying to get it off and pulled skin off. Since that time she has been having a hard time getting this to heal. Fortunately there does not appear to be any signs of active infection which is great news. She did have an ABI of 0.75 today. With that being said I am actually decently pleased with how the wound looks it does have some necrotic tissue but there is going to need to be some sharp debridement to clear away some of the dead tissue as well today. Patient does have a history of diabetes mellitus type 2, lymphedema, hypertension, and chronic venous insufficiency. 05-02-2023 upon evaluation  today patient appears to be doing well currently in regard to her wound. She is actually showing signs of improvement and very pleased in that regard and fortunately I do not see any signs of active infection locally or systemically at this time which is great news. No fevers, chills, nausea, vomiting, or diarrhea. 05-09-2023 upon evaluation today patient appears to be doing well currently in regard to her wound is not worse but is also not better. I discussed with her again today that we will get a need to likely compression wrap for her. With that being said she is adamantly opposed to this and wants to get 1 more week given something to try and I recommend to be seen by double layer Tubigrip to see if this can be beneficial. 05-17-2023 upon evaluation today patient appears to be doing well currently in regard to her wound although it still very inflamed on the posterior aspect of her ankle. I discussed that with her today. With that being said I do not see any evidence of general worsening which is great news but also do not see a significant amount of improvement compared to last week in my opinion. I discussed this with her at this point today. 05-23-2023 upon evaluation today patient appears to be doing well currently in regard to her wound. This is actually showing some slough and biofilm on the surface of the wound. The good news is I do believe that we cleaned some of the soften the scar ischial little bit of improvement with that being said the periwound is being very irritated I think the Vashe may have irritated her based on what she is telling me. Fortunately I do not see any evidence of active infection locally nor systemically at this time which is great news. No fevers, chills, nausea, vomiting, or diarrhea. 7/22; wound on the left lateral lower leg in the setting of significant  chronic venous hypertension. We have been using Prisma. She would not allow a compression wrap although she is  using a support stocking. 06-06-2023 upon evaluation today patient's wound actually showing signs of significant improvement with regard to her wound. She has been tolerating the dressing changes without complication. Fortunately there does not appear to be any signs of active infection locally nor systemically at this time which is great news. No fevers, chills, nausea, vomiting, or diarrhea. 06-20-2023 upon evaluation today patient appears to be doing well currently in regard to her wound which is actually showing signs of significant improvement. Fortunately I do not see any evidence of worsening infection I do believe that the patient is moving in the right direction which is great news and in general I think that we are on the right track here. 06-27-2023 upon evaluation today patient appears to be doing well currently in regard to her wound. She has been tolerating the dressing changes without complication and in general I do feel like there were making really good headway towards complete closure. I do not see any signs of active infection locally or systemically at this time. 07-14-2023 upon evaluation today patient appears to be doing well currently in regard to her wound. She has been tolerating the dressing changes without complication. Fortunately I do not see any signs of active infection at this time which is great news. No fevers, chills, nausea, vomiting, or diarrhea. 07-19-2023 upon evaluation today patient appears to be doing well currently in regard to her wound although is not significantly smaller. I do think making a change in shift in the dressing would be a good idea here. My suggestion is gena be that we go ahead and see about doing a Xeroform gauze dressing to see how this will do she is in agreement with that plan. 07-25-2023 upon evaluation today patient appears to be doing well currently in regard to her wound although she tells me the medicine burns pretty much all week. The  visual appearance of the wound is significantly improved compared to what it was previous which is good news. 08-01-2023 upon evaluation today patient's wound actually appears to be showing signs of excellent improvement. Fortunately I do not see any signs of active infection locally or systemically which is great news I actually feel like the wound is measuring smaller this looks much better. 08-15-2023 upon evaluation today patient appears to be doing well currently in regard to her wound. She has been tolerating the dressing changes she has been using Vaseline or Vaseline gauze and this seems to be doing quite well. Fortunately I do not see any evidence of worsening overall and I feel like the patient is making good headway towards complete closure. Objective Constitutional Well-nourished and well-hydrated in no acute distress. Vitals Time Taken: 8:47 AM, Height: 68 in, Weight: 200 lbs, BMI: 30.4, Temperature: 98.7 F, Pulse: 63 bpm, Respiratory Rate: 18 breaths/min, Blood Pressure: 146/66 mmHg. Respiratory normal breathing without difficulty. [name], [name] ([id number]) [id number] Page 6 of 7 Psychiatric this patient is able to make decisions and demonstrates good insight into disease process. Alert and Oriented x 3. pleasant and cooperative. General Notes: Upon inspection patient's wound bed actually showed signs of good granulation and epithelization at this point. Fortunately I do not see any evidence of worsening overall and I believe that the patient is making good headway towards complete closure. I believe that the Vaseline gauze and/or Vaseline applied over top of her dressing is doing a really  good job basically it was drying out too much as far as the wound was concerned and this is keeping it moist allowing it to heal appropriately I am very pleased. Integumentary (Hair, Skin) Wound #1 status is Open. Original cause of wound was Trauma. The date  acquired was: 02/07/2023. The wound has been in treatment 16 weeks. The wound is located on the Left,Distal,Lateral Lower Leg. The wound measures 0.3cm length x 0.4cm width x 0.1cm depth; 0.094cm^2 area and 0.009cm^3 volume. There is Fat Layer (Subcutaneous Tissue) exposed. There is no tunneling or undermining noted. There is a medium amount of serosanguineous drainage noted. There is no granulation within the wound bed. There is a large (67-100%) amount of necrotic tissue within the wound bed including Adherent Slough. Assessment Active Problems ICD-10 Chronic venous hypertension (idiopathic) with ulcer and inflammation of left lower extremity Type 2 diabetes mellitus with other skin ulcer Lymphedema, not elsewhere classified Non-pressure chronic ulcer of other part of left lower leg with fat layer exposed Essential (primary) hypertension Plan 1. Based on what I am seeing I do believe that the patient is doing well with the Vaseline gauze at this point I would recommend that we continue with that plan. 2. I am going to recommend as well the patient should continue to monitor for any signs of infection or worsening. Based on what I am seeing I do believe that we are making headway towards complete closure. 3. 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 With that being said if she has any concerns or questions she should contact the office let me know as soon as possible. We will see patient back for reevaluation in 1 week here in the clinic. If anything worsens or changes patient will contact our office for additional recommendations. Electronic Signature(s) Signed: 08/15/2023 9:04:58 AM By: Allen Derry PA-C Entered By: Allen Derry on 08/15/2023 06:04:57 -------------------------------------------------------------------------------- SuperBill Details Patient Name: Date of Service: [name], Nestor Ramp 08/15/2023 Medical Record Number:  [id number] Patient Account Number: [id number] Date of Birth/Sex: Treating RN: 1946/08/30 (77 y.o. [name] Primary [name] Provider: [name] Other Clinician: Referring Provider: Treating Provider/Extender: [name] in Treatment: 16 Diagnosis Coding ICD-10 Codes Code Description [phone] Chronic venous hypertension (idiopathic) with ulcer and inflammation of left lower extremity ELEYNA, BRUGH ([id number]) [phone].pdf Page 7 of 7 E11.622 Type 2 diabetes mellitus with other skin ulcer I89.0 Lymphedema, not elsewhere classified L97.822 Non-pressure chronic ulcer of other part of left lower leg with fat layer exposed I10 Essential (primary) hypertension Facility Procedures : CPT4 Code: [id number] Description: [phone] - WOUND [name] VISIT-LEV 2 EST PT Modifier: Quantity: 1 Physician Procedures : CPT4 Code Description Modifier [id number] - WC PHYS LEVEL 3 - EST PT ICD-10 Diagnosis Description I87.332 Chronic venous hypertension (idiopathic) with ulcer and inflammation of left lower extremity E11.622 Type 2 diabetes mellitus with other skin  ulcer I89.0 Lymphedema, not elsewhere classified L97.822 Non-pressure chronic ulcer of other part of left lower leg with fat layer exposed Quantity: 1 Electronic Signature(s) Signed: 08/15/2023 9:11:35 AM By: Allen Derry PA-C Previous Signature: 08/15/2023 9:10:54 AM Version By: [name] Entered By: Allen Derry on 08/15/2023 06:11:34

## 2023-08-21 IMAGING — CT CT HEAD W/O CM
2 series · 15 of 30 positions shown, 17 images · non-contrast
Comparison: None

CLINICAL DATA: TIA. Episode of right leg numbness in weakness 1
week ago. History of brain tumor. History of melanoma.



[Series 2: head without · axial · non-contrast · 0.45mm/px · z∈[-169,-49]mm · 7 of 34 slices shown, 9 images]
[im 5/34  brain]
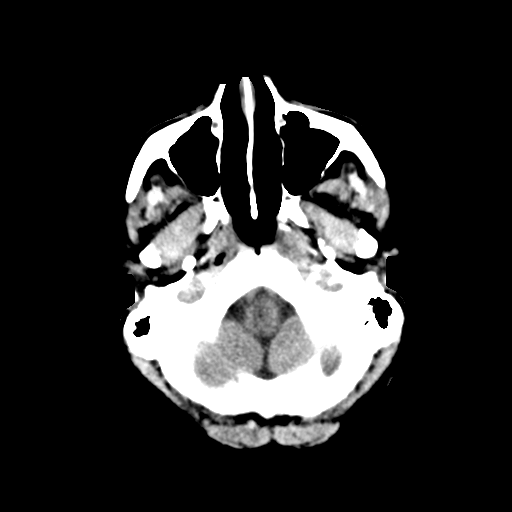
[im 5/34  bone]
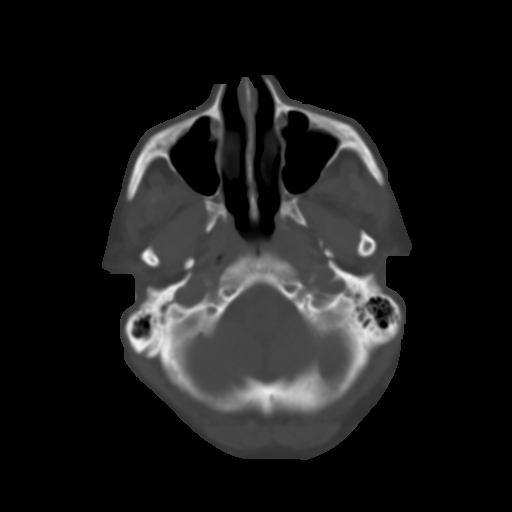
[im 9/34  brain]
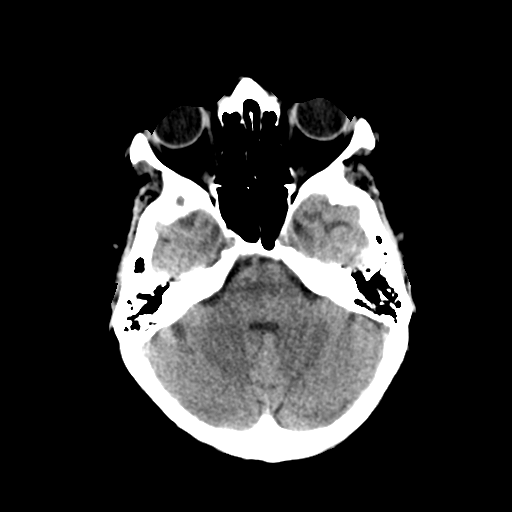
[im 13/34  brain]
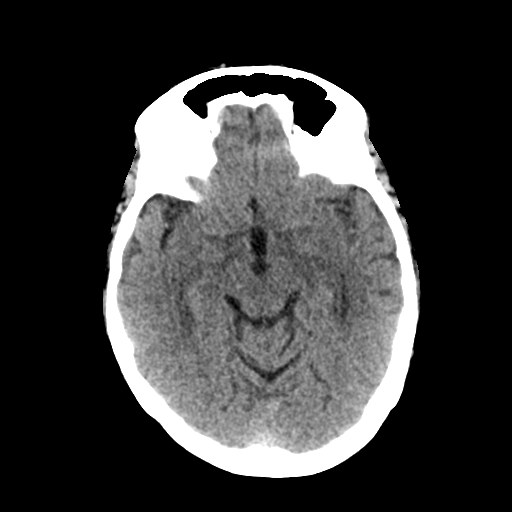
[im 17/34  brain]
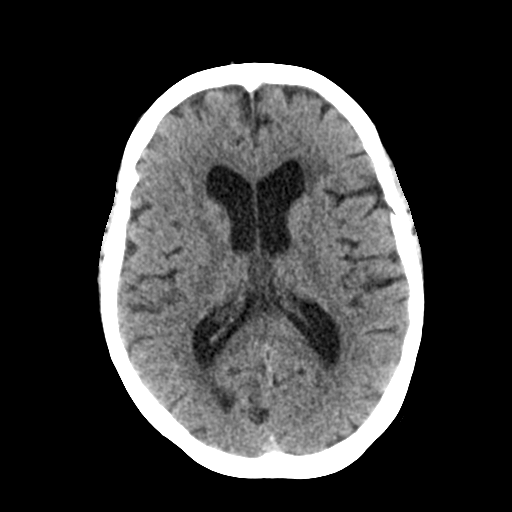
[im 21/34  brain]
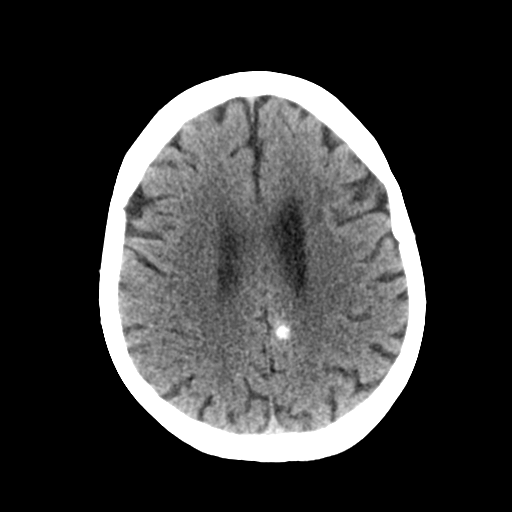
[im 21/34  bone]
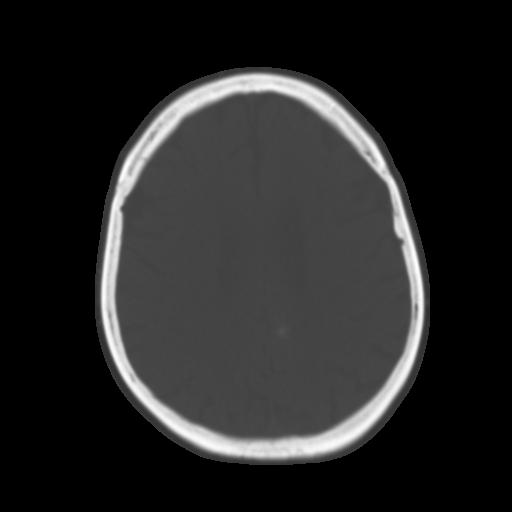
[im 25/34  brain]
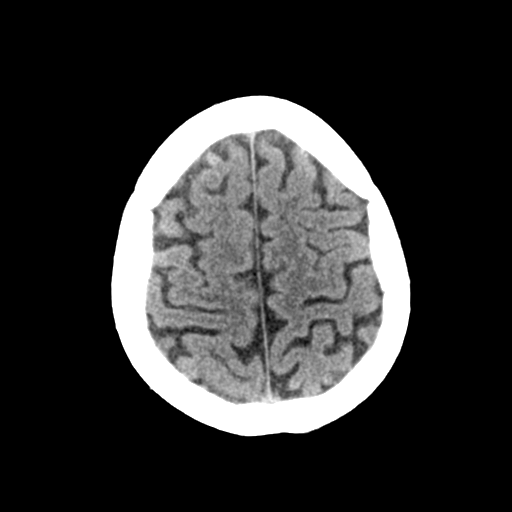
[im 29/34  brain]
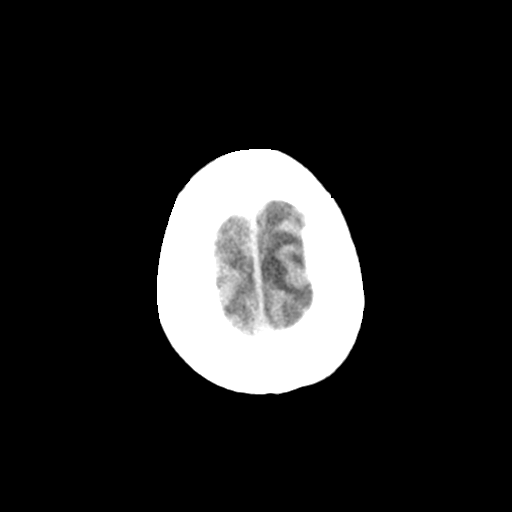

[Series 3: head bone · axial · 0.45mm/px · z∈[-173,-41]mm · 8 of 84 slices shown]
[im 9/84  bone]
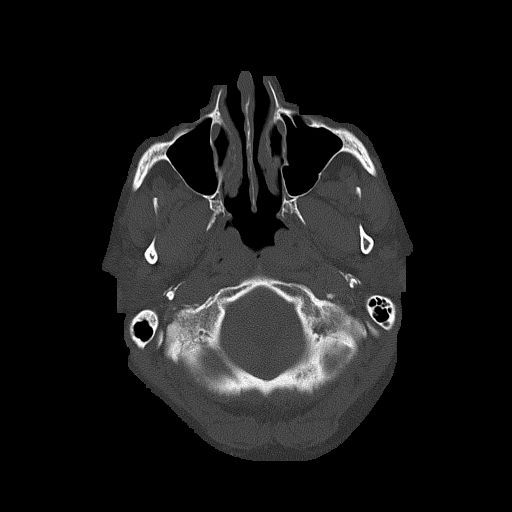
[im 17/84  bone]
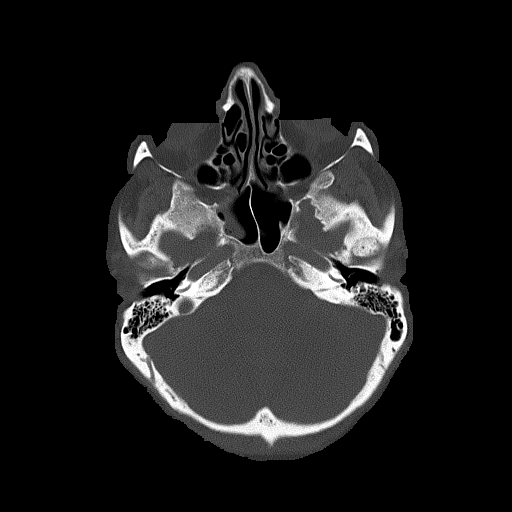
[im 25/84  bone]
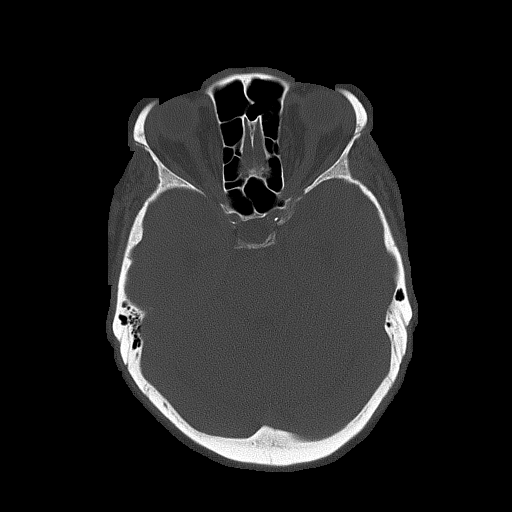
[im 38/84  bone]
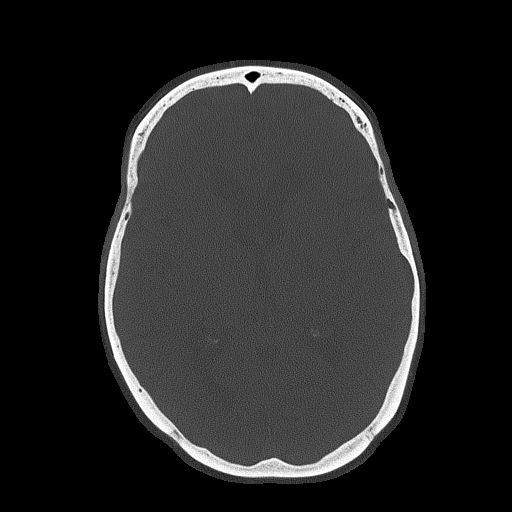
[im 46/84  bone]
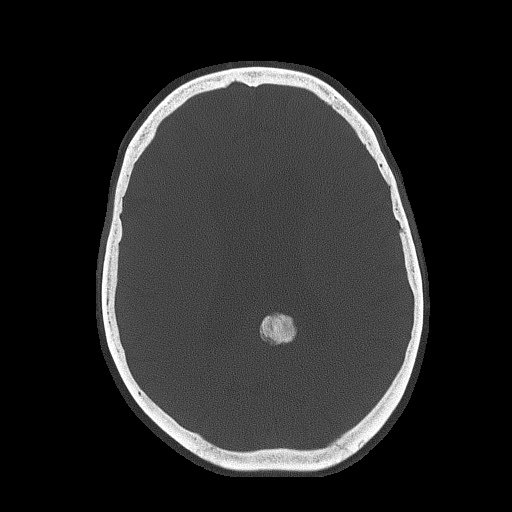
[im 59/84  bone]
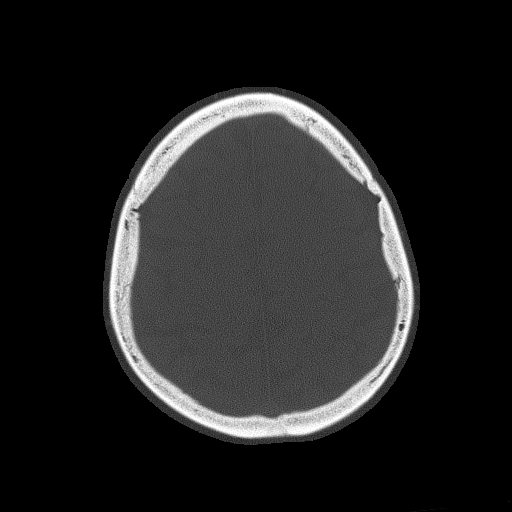
[im 67/84  bone]
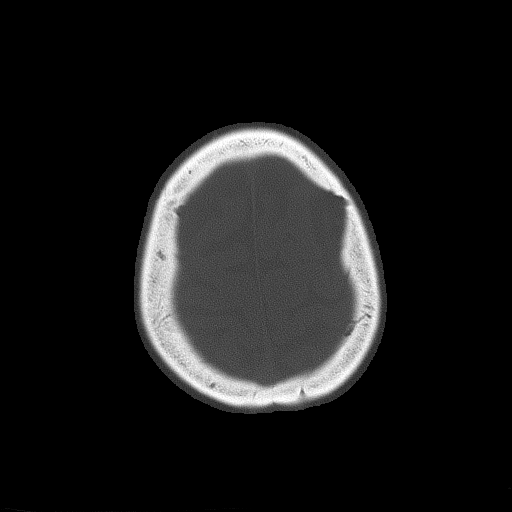
[im 75/84  bone]
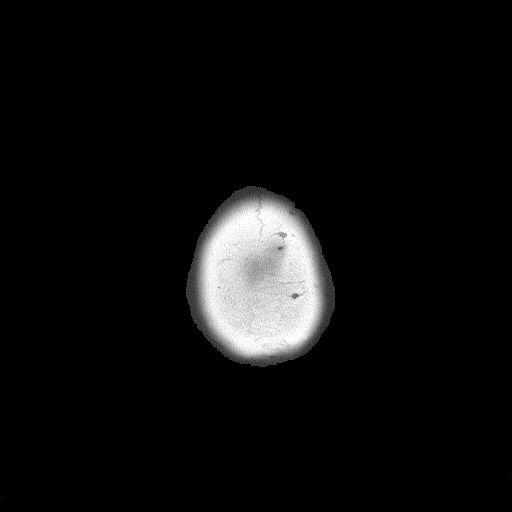

[15 of 30 positions shown; findings below may reference images not displayed]

FINDINGS: Brain: No evidence of acute or subacute infarction. No abnormality
seen affecting the brainstem or cerebellum. Cerebral hemispheres
show age related volume loss with mild chronic small-vessel ischemic
change of the white matter. There is a heavily calcified meningioma
projecting primarily leftward from the posterior falx measuring 18 x
16 x 16 mm. No evidence of brain edema. No hemorrhage, hydrocephalus
or extra-axial collection.

Vascular: Minimal calcification of the major vessels at the base of
the brain.

Skull: Normal

Sinuses/Orbits: Clear/normal

Other: None
IMPRESSION: No acute intracranial finding. Mild chronic small-vessel ischemic
change of the cerebral hemispheric white matter.

Heavily calcified meningioma projecting leftward from the posterior
falx, measuring 18 x 16 x 16 mm. No adjacent brain edema.

## 2023-08-29 ENCOUNTER — Ambulatory Visit: Payer: Federal, State, Local not specified - PPO | Admitting: Physician Assistant

## 2023-09-06 ENCOUNTER — Encounter: Payer: Federal, State, Local not specified - PPO | Admitting: Physician Assistant

## 2023-09-06 DIAGNOSIS — E11622 Type 2 diabetes mellitus with other skin ulcer: Secondary | ICD-10-CM | POA: Diagnosis not present

## 2023-09-06 NOTE — Progress Notes (Addendum)
[name], [name] ([id number]) [phone].pdf Page 1 of 9 Visit Report for 09/06/2023 Arrival Information Details Patient Name: Date of Service: [name], [name] 09/06/2023 8:45 A [name] Medical Record Number: [id number] Patient Account Number: [id number] Date of Birth/Sex: Treating RN: 1946-01-15 (77 y.o. [name] Primary Care Elsey Holts: [name] Other Clinician: Referring Blain Hunsucker: Treating Melannie Metzner/Extender: [name] in Treatment: 19 Visit Information History Since Last Visit Added or deleted any medications: No Patient Arrived: Ambulatory Any new allergies or adverse reactions: No Arrival Time: 08:55 Had a fall or experienced change in No Accompanied By: self activities of daily living that may affect Transfer Assistance: None risk of falls: Patient Identification Verified: Yes Signs or symptoms of abuse/neglect since last visito No Secondary Verification Process Completed: Yes Hospitalized since last visit: No Patient Requires Transmission-Based Precautions: No Implantable device outside of the clinic excluding No Patient Has Alerts: No cellular tissue based products placed in the center since last visit: Has Dressing in Place as Prescribed: Yes Pain Present Now: No Electronic Signature(s) Signed: 09/06/2023 3:20:47 PM By: [name] Entered By: Yevonne Pax on 09/06/2023 08:55:56 -------------------------------------------------------------------------------- Clinic Level of Care Assessment Details Patient Name: Date of Service: [name], [name] 09/06/2023 8:45 A [name] Medical Record Number: [id number] Patient Account Number: [id number] Date of Birth/Sex: Treating RN: September 14, 1946 (77 y.o. [name] Primary Care [name] Agrusa: [name] Other Clinician: Referring Aylissa Heinemann: Treating Lenon Kuennen/Extender: [name] in Treatment: 19 Clinic Level of Care Assessment  Items TOOL 4 Quantity Score X- 1 0 Use when only an EandM is performed on FOLLOW-UP visit ASSESSMENTS - Nursing Assessment / Reassessment X- 1 10 Reassessment of Co-morbidities (includes updates in patient status) X- 1 5 Reassessment of Adherence to Treatment Plan [name], [name] ([id number]) [phone].pdf Page 2 of 9 ASSESSMENTS - Wound and Skin A ssessment / Reassessment X - Simple Wound Assessment / Reassessment - one wound 1 5 []  - 0 Complex Wound Assessment / Reassessment - multiple wounds []  - 0 Dermatologic / Skin Assessment (not related to wound area) ASSESSMENTS - Focused Assessment []  - 0 Circumferential Edema Measurements - multi extremities []  - 0 Nutritional Assessment / Counseling / Intervention []  - 0 Lower Extremity Assessment (monofilament, tuning fork, pulses) []  - 0 Peripheral Arterial Disease Assessment (using hand held doppler) ASSESSMENTS - Ostomy and/or Continence Assessment and Care []  - 0 Incontinence Assessment and Management []  - 0 Ostomy Care Assessment and Management (repouching, etc.) PROCESS - Coordination of Care X - Simple Patient / Family Education for ongoing care 1 15 []  - 0 Complex (extensive) Patient / Family Education for ongoing care []  - 0 Staff obtains Chiropractor, Records, T Results / Process Orders est []  - 0 Staff telephones HHA, Nursing Homes / Clarify orders / etc []  - 0 Routine Transfer to another Facility (non-emergent condition) []  - 0 Routine Hospital Admission (non-emergent condition) []  - 0 New Admissions / Manufacturing engineer / Ordering NPWT Apligraf, etc. , []  - 0 Emergency Hospital Admission (emergent condition) []  - 0 Simple Discharge Coordination []  - 0 Complex (extensive) Discharge Coordination PROCESS - Special Needs []  - 0 Pediatric / Minor Patient Management []  - 0 Isolation Patient Management []  - 0 Hearing / Language / Visual special needs []  - 0 Assessment of  Community assistance (transportation, D/C planning, etc.) []  - 0 Additional assistance / Altered mentation []  - 0 Support Surface(s) Assessment (bed, cushion, seat, etc.) INTERVENTIONS - Wound Cleansing / Measurement X - Simple Wound  Cleansing - one wound 1 5 []  - 0 Complex Wound Cleansing - multiple wounds X- 1 5 Wound Imaging (photographs - any number of wounds) []  - 0 Wound Tracing (instead of photographs) X- 1 5 Simple Wound Measurement - one wound []  - 0 Complex Wound Measurement - multiple wounds INTERVENTIONS - Wound Dressings X - Small Wound Dressing one or multiple wounds 1 10 []  - 0 Medium Wound Dressing one or multiple wounds []  - 0 Large Wound Dressing one or multiple wounds []  - 0 Application of Medications - topical []  - 0 Application of Medications - injection INTERVENTIONS - Miscellaneous []  - 0 External ear exam [name], [name] ([id number]) [phone].pdf Page 3 of 9 []  - 0 Specimen Collection (cultures, biopsies, blood, body fluids, etc.) []  - 0 Specimen(s) / Culture(s) sent or taken to Lab for analysis []  - 0 Patient Transfer (multiple staff / Michiel Sites Lift / Similar devices) []  - 0 Simple Staple / Suture removal (25 or less) []  - 0 Complex Staple / Suture removal (26 or more) []  - 0 Hypo / Hyperglycemic Management (close monitor of Blood Glucose) []  - 0 Ankle / Brachial Index (ABI) - do not check if billed separately X- 1 5 Vital Signs Has the patient been seen at the hospital within the last three years: Yes Total Score: 65 Level Of Care: New/Established - Level 2 Electronic Signature(s) Signed: 09/06/2023 3:20:47 PM By: [name] Entered By: Yevonne Pax on 09/06/2023 09:16:59 -------------------------------------------------------------------------------- Complex / Palliative Patient Assessment Details Patient Name: Date of Service: [name], [name] 09/06/2023 8:45 A [name] Medical Record Number:  [id number] Patient Account Number: [id number] Date of Birth/Sex: Treating RN: 1946/02/16 (77 y.o. [name] Primary Care Chelise Hanger: [name] Other Clinician: Referring Cordero Surette: Treating Sherod Cisse/Extender: [name] in Treatment: 19 Complex Wound Management Criteria Patient has remarkable or complex co-morbidities requiring medications or treatments that extend wound healing times. Examples: Diabetes mellitus with chronic renal failure or end stage renal disease requiring dialysis Advanced or poorly controlled rheumatoid arthritis Diabetes mellitus and end stage chronic obstructive pulmonary disease Active cancer with current chemo- or radiation therapy DM, HTN, venous insuff, lymphedema Palliative Wound Management Criteria Care Approach Wound Care Plan: Complex Wound Management Electronic Signature(s) Signed: 09/14/2023 10:01:56 AM By: [name] Signed: 09/22/2023 7:58:12 AM By: Allen Derry PA-C Entered By: Yevonne Pax on 09/14/2023 10:01:56 Gayla Doss ([id number]) [id number] Page 4 of 9 -------------------------------------------------------------------------------- Encounter Discharge Information Details Patient Name: Date of Service: [name], [name] 09/06/2023 8:45 A [name] Medical Record Number: [id number] Patient Account Number: [id number] Date of Birth/Sex: Treating RN: 03-May-1946 (77 y.o. [name] Primary Care Veryl Abril: [name] Other Clinician: Referring Xoie Kreuser: Treating Soyla Bainter/Extender: [name] in Treatment: 19 Encounter Discharge Information Items Discharge Condition: Stable Ambulatory Status: Ambulatory Discharge Destination: Home Transportation: Private Auto Accompanied By: self Schedule Follow-up Appointment: Yes Clinical Summary of Care: Electronic Signature(s) Signed: 09/06/2023 3:20:47 PM By: [name] Entered By: Yevonne Pax  on 09/06/2023 09:17:48 -------------------------------------------------------------------------------- Lower Extremity Assessment Details Patient Name: Date of Service: [name], [name] 09/06/2023 8:45 A [name] Medical Record Number: [id number] Patient Account Number: [id number] Date of Birth/Sex: Treating RN: Nov 12, 1945 (77 y.o. [name] Primary Care Maycen Degregory: [name] Other Clinician: Referring Alejandro Gamel: Treating Taron Mondor/Extender: [name] in Treatment: 19 Electronic Signature(s) Signed: 09/06/2023 3:20:47 PM By: [name] Entered By: Yevonne Pax on 09/06/2023 08:59:20 Kuehne, Nestor Ramp ([id number]) [id number] Page 5  of 9 -------------------------------------------------------------------------------- Multi Wound Chart Details Patient Name: Date of Service: [name], [name] 09/06/2023 8:45 A [name] Medical Record Number: [id number] Patient Account Number: [id number] Date of Birth/Sex: Treating RN: 25-Mar-1946 (77 y.o. [name] Primary Care Michaiah Holsopple: [name] Other Clinician: Referring Rhema Boyett: Treating Regis Wiland/Extender: [name] in Treatment: 19 Vital Signs Height(in): 68 Pulse(bpm): 72 Weight(lbs): 200 Blood Pressure(mmHg): 169/72 Body Mass Index(BMI): 30.4 Temperature(F): 98.1 Respiratory Rate(breaths/min): 18 [1:Photos:] [N/A:N/A] Left, Distal, Lateral Lower Leg N/A N/A Wound Location: Trauma N/A N/A Wounding Event: Diabetic Wound/Ulcer of the Lower N/A N/A Primary Etiology: Extremity Lymphedema, Hypertension, Type II N/A N/A Comorbid History: Diabetes 02/07/2023 N/A N/A Date Acquired: 54 N/A N/A Weeks of Treatment: Open N/A N/A Wound Status: No N/A N/A Wound Recurrence: 0.2x0.3x0.1 N/A N/A Measurements L x W x D (cm) 0.047 N/A N/A A (cm) : rea 0.005 N/A N/A Volume (cm) : 86.70% N/A N/A % Reduction in A rea: 95.30% N/A N/A % Reduction  in Volume: Grade 1 N/A N/A Classification: Medium N/A N/A Exudate A mount: Serosanguineous N/A N/A Exudate Type: red, brown N/A N/A Exudate Color: None Present (0%) N/A N/A Granulation A mount: Large (67-100%) N/A N/A Necrotic A mount: Fat Layer (Subcutaneous Tissue): Yes N/A N/A Exposed Structures: Fascia: No Tendon: No Muscle: No Joint: No Bone: No Small (1-33%) N/A N/A Epithelialization: Treatment Notes Electronic Signature(s) Signed: 09/06/2023 3:20:47 PM By: [name] Entered By: Yevonne Pax on 09/06/2023 08:59:29 Cadogan, Nestor Ramp ([id number]) [id number] Page 6 of 9 -------------------------------------------------------------------------------- Multi-Disciplinary Care Plan Details Patient Name: Date of Service: [name], [name] 09/06/2023 8:45 A [name] Medical Record Number: [id number] Patient Account Number: [id number] Date of Birth/Sex: Treating RN: 1946/01/30 (77 y.o. [name] Primary Care Dannah Ryles: [name] Other Clinician: Referring Dominyck Reser: Treating Travor Royce/Extender: [name] in Treatment: 19 Active Inactive Wound/Skin Impairment Nursing Diagnoses: Knowledge deficit related to ulceration/compromised skin integrity Goals: Patient/caregiver will verbalize understanding of skin care regimen Date Initiated: 04/22/2023 Target Resolution Date: 09/22/2023 Goal Status: Active Ulcer/skin breakdown will have a volume reduction of 30% by week 4 Date Initiated: 04/22/2023 Date Inactivated: 06/27/2023 Target Resolution Date: 05/22/2023 Goal Status: Unmet Unmet Reason: comorbidities Ulcer/skin breakdown will have a volume reduction of 50% by week 8 Date Initiated: 04/22/2023 Date Inactivated: 06/27/2023 Target Resolution Date: 06/22/2023 Goal Status: Unmet Unmet Reason: comorbidities Ulcer/skin breakdown will have a volume reduction of 80% by week 12 Date Initiated: 04/22/2023 Date  Inactivated: 07/25/2023 Target Resolution Date: 07/23/2023 Goal Status: Unmet Unmet Reason: comorbidities Ulcer/skin breakdown will heal within 14 weeks Date Initiated: 04/22/2023 Date Inactivated: 09/06/2023 Target Resolution Date: 08/22/2023 Goal Status: Unmet Unmet Reason: comorbidities Interventions: Assess patient/caregiver ability to obtain necessary supplies Assess patient/caregiver ability to perform ulcer/skin care regimen upon admission and as needed Assess ulceration(s) every visit Notes: Electronic Signature(s) Signed: 09/06/2023 3:20:47 PM By: [name] Entered By: Yevonne Pax on 09/06/2023 08:59:56 -------------------------------------------------------------------------------- Pain Assessment Details Patient Name: Date of Service: [name], [name] 09/06/2023 8:45 A [name] Medical Record Number: [id number] Patient Account Number: [id number] Date of Birth/Sex: Treating RN: 1945/11/29 (77 y.o. [name] Primary Care Keyli Duross: [name] Other Clinician: Referring Asencion Loveday: Treating Christabelle Hanzlik/Extender: [name] in Treatment: 19 Active Problems Location of Pain Severity and Description of Pain Patient Has Paino No Site Locations MARYKATHLEEN, MOZEE ([id number]) [phone].pdf Page 7 of 9 Pain Management and Medication Current Pain Management: Electronic Signature(s) Signed: 09/06/2023 3:20:47 PM By: [name] Entered By:  Yevonne Pax on 09/06/2023 08:56:33 -------------------------------------------------------------------------------- Patient/Caregiver Education Details Patient Name: Date of Service: [name], [name] 10/29/2024andnbsp8:45 A [name] Medical Record Number: [id number] Patient Account Number: [id number] Date of Birth/Gender: Treating RN: [birth year] (77 y.o. [name] Primary Care Physician: [name] Other Clinician: Referring Physician: Treating Physician/Extender:  [name] in Treatment: 24 Education Assessment Education Provided To: Patient Education Topics Provided Wound/Skin Impairment: Handouts: Caring for Your Ulcer Methods: Explain/Verbal Responses: State content correctly Electronic Signature(s) Signed: 09/06/2023 3:20:47 PM By: [name] Entered By: Yevonne Pax on 09/06/2023 09:00:14 Gayla Doss ([id number]) [id number] Page 8 of 9 -------------------------------------------------------------------------------- Wound Assessment Details Patient Name: Date of Service: ALYSSE, HOCKENBERRY 09/06/2023 8:45 A [name] Medical Record Number: [id number] Patient Account Number: [id number] Date of Birth/Sex: Treating RN: [birth year] (77 y.o. [name] Primary Care Justene Jensen: [name] Other Clinician: Referring Bentley Haralson: Treating Fawnda Vitullo/Extender: [name] in Treatment: 19 Wound Status Wound Number: 1 Primary Etiology: Diabetic Wound/Ulcer of the Lower Extremity Wound Location: Left, Distal, Lateral Lower Leg Wound Status: Open Wounding Event: Trauma Comorbid History: Lymphedema, Hypertension, Type II Diabetes Date Acquired: 02/07/2023 Weeks Of Treatment: 19 Clustered Wound: No Photos Wound Measurements Length: (cm) 0.2 Width: (cm) 0.3 Depth: (cm) 0.1 Area: (cm) 0.047 Volume: (cm) 0.005 % Reduction in Area: 86.7% % Reduction in Volume: 95.3% Epithelialization: Small (1-33%) Tunneling: No Undermining: No Wound Description Classification: Grade 1 Exudate Amount: Medium Exudate Type: Serosanguineous Exudate Color: red, brown Foul Odor After Cleansing: No Slough/Fibrino Yes Wound Bed Granulation Amount: None Present (0%) Exposed Structure Necrotic Amount: Large (67-100%) Fascia Exposed: No Necrotic Quality: Adherent Slough Fat Layer (Subcutaneous Tissue) Exposed: Yes Tendon Exposed: No Muscle Exposed: No Joint Exposed:  No Bone Exposed: No Electronic Signature(s) Signed: 09/06/2023 3:20:47 PM By: [name] Entered By: Yevonne Pax on 09/06/2023 08:59:03 Glander, Nestor Ramp ([id number]) [id number] Page 9 of 9 -------------------------------------------------------------------------------- Vitals Details Patient Name: Date of Service: CAITLYNNE, TOW 09/06/2023 8:45 A [name] Medical Record Number: [id number] Patient Account Number: [id number] Date of Birth/Sex: Treating RN: 1945-11-11 (77 y.o. [name] Primary Care Mikahla Wisor: [name] Other Clinician: Referring Parveen Freehling: Treating Hawkins Seaman/Extender: [name] in Treatment: 19 Vital Signs Time Taken: 08:56 Temperature (F): 98.1 Height (in): 68 Pulse (bpm): 72 Weight (lbs): 200 Respiratory Rate (breaths/min): 18 Body Mass Index (BMI): 30.4 Blood Pressure (mmHg): 169/72 Reference Range: 80 - 120 mg / dl Electronic Signature(s) Signed: 09/06/2023 3:20:47 PM By: [name] Entered By: Yevonne Pax on 09/06/2023 08:56:22

## 2023-09-07 NOTE — Progress Notes (Signed)
KACE, ASEBEDO ([id number]) [id number] Page 1 of 7 Visit Report for 09/06/2023 Chief Complaint Document Details Patient Name: Date of Service: [name], [name] 09/06/2023 8:45 A M Medical Record Number: [id number] Patient Account Number: [id number] Date of Birth/Sex: Treating RN: March 04, 1946 (77 y.o. [name] Primary [name] Provider: [name] Other Clinician: Referring Provider: Treating Provider/Extender: [name] in Treatment: 19 Information Obtained from: Patient Chief Complaint Left LE Ulcer Electronic Signature(s) Signed: 09/06/2023 9:21:39 AM By: Allen Derry PA-C Entered By: Allen Derry on 09/06/2023 06:21:39 -------------------------------------------------------------------------------- HPI Details Patient Name: Date of Service: [name]. 09/06/2023 8:45 A M Medical Record Number: [id number] Patient Account Number: [id number] Date of Birth/Sex: Treating RN: Mar 24, 1946 (77 y.o. [name] Primary [name] Provider: [name] Other Clinician: Referring Provider: Treating Provider/Extender: [name] in Treatment: 19 History of Present Illness HPI Description: 04-22-2023 upon evaluation patient appears to be doing somewhat poorly in regard to a wound on the left distal/lateral lower extremity and about the ankle region. She tells me this occurred on February 07, 2023 as a result of trying to get a very tight and new compression sock off. It was getting stuck and she somewhat twisted trying to get it off and pulled skin off. Since that time she has been having a hard time getting this to heal. Fortunately there does not appear to be any signs of active infection which is great news. She did have an ABI of 0.75 today. With that being said I am actually decently pleased with how the wound looks it does have some necrotic tissue but there is going to need to be some  sharp debridement to clear away some of the dead tissue as well today. Patient does have a history of diabetes mellitus type 2, lymphedema, hypertension, and chronic venous insufficiency. 05-02-2023 upon evaluation today patient appears to be doing well currently in regard to her wound. She is actually showing signs of improvement and very pleased in that regard and fortunately I do not see any signs of active infection locally or systemically at this time which is great news. No fevers, chills, nausea, vomiting, or diarrhea. 05-09-2023 upon evaluation today patient appears to be doing well currently in regard to her wound is not worse but is also not better. I discussed with her again today that we will get a need to likely compression wrap for her. With that being said she is adamantly opposed to this and wants to get 1 more week given something to try and I recommend to be seen by double layer Tubigrip to see if this can be beneficial. [name], [name] ([id number]) [id number] Page 2 of 7 05-17-2023 upon evaluation today patient appears to be doing well currently in regard to her wound although it still very inflamed on the posterior aspect of her ankle. I discussed that with her today. With that being said I do not see any evidence of general worsening which is great news but also do not see a significant amount of improvement compared to last week in my opinion. I discussed this with her at this point today. 05-23-2023 upon evaluation today patient appears to be doing well currently in regard to her wound. This is actually showing some slough and biofilm on the surface of the wound. The good news is I do believe that we cleaned some of the soften the scar ischial little bit of improvement with that being said the periwound is being  very irritated I think the Vashe may have irritated her based on what she is telling me. Fortunately I do not see any evidence of  active infection locally nor systemically at this time which is great news. No fevers, chills, nausea, vomiting, or diarrhea. 7/22; wound on the left lateral lower leg in the setting of significant chronic venous hypertension. We have been using Prisma. She would not allow a compression wrap although she is using a support stocking. 06-06-2023 upon evaluation today patient's wound actually showing signs of significant improvement with regard to her wound. She has been tolerating the dressing changes without complication. Fortunately there does not appear to be any signs of active infection locally nor systemically at this time which is great news. No fevers, chills, nausea, vomiting, or diarrhea. 06-20-2023 upon evaluation today patient appears to be doing well currently in regard to her wound which is actually showing signs of significant improvement. Fortunately I do not see any evidence of worsening infection I do believe that the patient is moving in the right direction which is great news and in general I think that we are on the right track here. 06-27-2023 upon evaluation today patient appears to be doing well currently in regard to her wound. She has been tolerating the dressing changes without complication and in general I do feel like there were making really good headway towards complete closure. I do not see any signs of active infection locally or systemically at this time. 07-14-2023 upon evaluation today patient appears to be doing well currently in regard to her wound. She has been tolerating the dressing changes without complication. Fortunately I do not see any signs of active infection at this time which is great news. No fevers, chills, nausea, vomiting, or diarrhea. 07-19-2023 upon evaluation today patient appears to be doing well currently in regard to her wound although is not significantly smaller. I do think making a change in shift in the dressing would be a good idea here. My  suggestion is gena be that we go ahead and see about doing a Xeroform gauze dressing to see how this will do she is in agreement with that plan. 07-25-2023 upon evaluation today patient appears to be doing well currently in regard to her wound although she tells me the medicine burns pretty much all week. The visual appearance of the wound is significantly improved compared to what it was previous which is good news. 08-01-2023 upon evaluation today patient's wound actually appears to be showing signs of excellent improvement. Fortunately I do not see any signs of active infection locally or systemically which is great news I actually feel like the wound is measuring smaller this looks much better. 08-15-2023 upon evaluation today patient appears to be doing well currently in regard to her wound. She has been tolerating the dressing changes she has been using Vaseline or Vaseline gauze and this seems to be doing quite well. Fortunately I do not see any evidence of worsening overall and I feel like the patient is making good headway towards complete closure. 09-06-2023 upon evaluation today patient's wound is actually showing signs of excellent improvement in fact I think she is very close to complete resolution. Fortunately I do not see any signs of worsening overall and I do believe that the patient is making headway towards closure. Electronic Signature(s) Signed: 09/06/2023 10:02:09 AM By: Allen Derry PA-C Entered By: Allen Derry on 09/06/2023 07:02:09 -------------------------------------------------------------------------------- Physical Exam Details Patient Name: Date of Service: [name], [facility]  J. 09/06/2023 8:45 A M Medical Record Number: [id number] Patient Account Number: [id number] Date of Birth/Sex: Treating RN: Jul 13, 1946 (77 y.o. [name] Primary [name] Provider: [name] Other Clinician: Referring Provider: Treating Provider/Extender: [name] in Treatment: 9 Constitutional Well-nourished and well-hydrated in no acute distress. Respiratory normal breathing without difficulty. Psychiatric this patient is able to make decisions and demonstrates good insight into disease process. Alert and Oriented x 3. pleasant and cooperative. Notes Upon inspection patient's wound again does not appear to be terribly open in fact there is very little open at this point at all. I do not see any signs of active REMIAH, CASARELLA ([id number]) [id number] Page 3 of 7 infection which is great news and in general I do think that she overall does seem to be having some issues here with slow healing but nonetheless this is almost completely closed. Electronic Signature(s) Signed: 09/06/2023 10:02:36 AM By: Allen Derry PA-C Entered By: Allen Derry on 09/06/2023 07:02:36 -------------------------------------------------------------------------------- Physician Orders Details Patient Name: Date of Service: [name]. 09/06/2023 8:45 A M Medical Record Number: [id number] Patient Account Number: [id number] Date of Birth/Sex: Treating RN: November 29, 1945 (77 y.o. [name] Primary [name] Provider: [name] Other Clinician: Referring Provider: Treating Provider/Extender: [name] in Treatment: 19 The following information was scribed by: Yevonne Pax The information was scribed for: Allen Derry Verbal / Phone Orders: No Diagnosis Coding Follow-up Appointments Return Appointment in 2 weeks. Bathing/ Shower/ Hygiene May shower; gently cleanse wound with antibacterial soap, rinse and pat dry prior to dressing wounds Wound Treatment Wound #1 - Lower Leg Wound Laterality: Left, Lateral, Distal Cleanser: Byram Ancillary Kit - 15 Day Supply (Generic) 3 x Per Week/30 Days Discharge Instructions: Use supplies as instructed; Kit contains: (15) Saline Bullets; (15) 3x3 Gauze; 15  pr Gloves Prim Dressing: Vaseline Impregnated Gauze Dressing, 3x9 (in/in) ary 3 x Per Week/30 Days Secondary Dressing: (BORDER) Zetuvit Plus SILICONE BORDER Dressing 4x4 (in/in) 3 x Per Week/30 Days Discharge Instructions: Please do not put silicone bordered dressings under wraps. Use non-bordered dressing only. Electronic Signature(s) Signed: 09/06/2023 11:57:10 AM By: [name] Signed: 09/06/2023 6:10:41 PM By: Allen Derry PA-C Entered By: Yevonne Pax on 09/06/2023 08:57:10 -------------------------------------------------------------------------------- Problem List Details Patient Name: Date of Service: [name], [name] J. 09/06/2023 8:45 A M Medical Record Number: [id number] Patient Account Number: [id number] [name], [name] ([id number]) [id number] Page 4 of 7 Date of Birth/Sex: Treating RN: Feb 27, 1946 (77 y.o. 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 09/06/2023 8:45 A M Medical Record Number: [id number] Patient Account  Number: [id number] Date of Birth/Sex: Treating RN: 21-Mar-1946 (77 y.o. [name] Primary [name] Provider: [name] Other Clinician: Referring Provider: Treating Provider/Extender: [name] in Treatment: 19 Subjective Chief  Complaint Information obtained from Patient Left LE Ulcer History of Present Illness (HPI) 04-22-2023 upon evaluation patient appears to be doing somewhat poorly in regard to a wound on the left distal/lateral lower extremity and about the ankle region. She tells me this occurred on February 07, 2023 as a result of trying to get a very tight and new compression sock off. It was getting stuck and she somewhat twisted trying to get it off and pulled skin off. Since that time she has been having a hard time getting this to heal. Fortunately there does not appear to be any signs of active infection which is great news. She did have an ABI of 0.75 today. With that being said I am actually decently pleased with how the wound looks it does have some necrotic tissue but there is going to need to be some sharp debridement to clear away some of the dead tissue as well [name], [name] ([id number]) [id number] Page 5 of 7 today. Patient does have a history of diabetes mellitus type 2, lymphedema, hypertension, and chronic venous insufficiency. 05-02-2023 upon evaluation today patient appears to be doing well currently in regard to her wound. She is actually showing signs of improvement and very pleased in that regard and fortunately I do not see any signs of active infection locally or systemically at this time which is great news. No fevers, chills, nausea, vomiting, or diarrhea. 05-09-2023 upon evaluation today patient appears to be doing well currently in regard to her wound is not worse but is also not better. I discussed with her again today that we will get a need to likely compression wrap for her. With that being said  she is adamantly opposed to this and wants to get 1 more week given something to try and I recommend to be seen by double layer Tubigrip to see if this can be beneficial. 05-17-2023 upon evaluation today patient appears to be doing well currently in regard to her wound although it still very inflamed on the posterior aspect of her ankle. I discussed that with her today. With that being said I do not see any evidence of general worsening which is great news but also do not see a significant amount of improvement compared to last week in my opinion. I discussed this with her at this point today. 05-23-2023 upon evaluation today patient appears to be doing well currently in regard to her wound. This is actually showing some slough and biofilm on the surface of the wound. The good news is I do believe that we cleaned some of the soften the scar ischial little bit of improvement with that being said the periwound is being very irritated I think the Vashe may have irritated her based on what she is telling me. Fortunately I do not see any evidence of active infection locally nor systemically at this time which is great news. No fevers, chills, nausea, vomiting, or diarrhea. 7/22; wound on the left lateral lower leg in the setting of significant chronic venous hypertension. We have been using Prisma. She would not allow a compression wrap although she is using a support stocking. 06-06-2023 upon evaluation today patient's wound actually showing signs of significant improvement with regard to her wound. She has been tolerating the dressing changes without complication. Fortunately there does not appear to be any signs of active infection locally nor systemically at this time which is great news. No fevers, chills, nausea, vomiting, or diarrhea. 06-20-2023 upon evaluation today patient appears to  be doing well currently in regard to her wound which is actually showing signs of significant improvement. Fortunately I  do not see any evidence of worsening infection I do believe that the patient is moving in the right direction which is great news and in general I think that we are on the right track here. 06-27-2023 upon evaluation today patient appears to be doing well currently in regard to her wound. She has been tolerating the dressing changes without complication and in general I do feel like there were making really good headway towards complete closure. I do not see any signs of active infection locally or systemically at this time. 07-14-2023 upon evaluation today patient appears to be doing well currently in regard to her wound. She has been tolerating the dressing changes without complication. Fortunately I do not see any signs of active infection at this time which is great news. No fevers, chills, nausea, vomiting, or diarrhea. 07-19-2023 upon evaluation today patient appears to be doing well currently in regard to her wound although is not significantly smaller. I do think making a change in shift in the dressing would be a good idea here. My suggestion is gena be that we go ahead and see about doing a Xeroform gauze dressing to see how this will do she is in agreement with that plan. 07-25-2023 upon evaluation today patient appears to be doing well currently in regard to her wound although she tells me the medicine burns pretty much all week. The visual appearance of the wound is significantly improved compared to what it was previous which is good news. 08-01-2023 upon evaluation today patient's wound actually appears to be showing signs of excellent improvement. Fortunately I do not see any signs of active infection locally or systemically which is great news I actually feel like the wound is measuring smaller this looks much better. 08-15-2023 upon evaluation today patient appears to be doing well currently in regard to her wound. She has been tolerating the dressing changes she has been using Vaseline or  Vaseline gauze and this seems to be doing quite well. Fortunately I do not see any evidence of worsening overall and I feel like the patient is making good headway towards complete closure. 09-06-2023 upon evaluation today patient's wound is actually showing signs of excellent improvement in fact I think she is very close to complete resolution. Fortunately I do not see any signs of worsening overall and I do believe that the patient is making headway towards closure. Objective Constitutional Well-nourished and well-hydrated in no acute distress. Vitals Time Taken: 8:56 AM, Height: 68 in, Weight: 200 lbs, BMI: 30.4, Temperature: 98.1 F, Pulse: 72 bpm, Respiratory Rate: 18 breaths/min, Blood Pressure: 169/72 mmHg. Respiratory normal breathing without difficulty. Psychiatric this patient is able to make decisions and demonstrates good insight into disease process. Alert and Oriented x 3. pleasant and cooperative. General Notes: Upon inspection patient's wound again does not appear to be terribly open in fact there is very little open at this point at all. I do not see any signs of active infection which is great news and in general I do think that she overall does seem to be having some issues here with slow healing but nonetheless this is almost completely closed. Integumentary (Hair, Skin) Wound #1 status is Open. Original cause of wound was Trauma. The date acquired was: 02/07/2023. The wound has been in treatment 19 weeks. The wound is located on the Left,Distal,Lateral Lower Leg. The wound measures 0.2cm  length x 0.3cm width x 0.1cm depth; 0.047cm^2 area and 0.005cm^3 volume. There is Fat Layer (Subcutaneous Tissue) exposed. There is no tunneling or undermining noted. There is a medium amount of serosanguineous drainage noted. There is no granulation within the wound bed. There is a large (67-100%) amount of necrotic tissue within the wound bed including Adherent Slough. [name], [name]  ([id number]) [id number] Page 6 of 7 Assessment Active Problems ICD-10 Chronic venous hypertension (idiopathic) with ulcer and inflammation of left lower extremity Type 2 diabetes mellitus with other skin ulcer Lymphedema, not elsewhere classified Non-pressure chronic ulcer of other part of left lower leg with fat layer exposed Essential (primary) hypertension Plan Follow-up Appointments: Return Appointment in 2 weeks. Bathing/ Shower/ Hygiene: May shower; gently cleanse wound with antibacterial soap, rinse and pat dry prior to dressing wounds WOUND #1: - Lower Leg Wound Laterality: Left, Lateral, Distal Cleanser: Byram Ancillary Kit - 15 Day Supply (Generic) 3 x Per Week/30 Days Discharge Instructions: Use supplies as instructed; Kit contains: (15) Saline Bullets; (15) 3x3 Gauze; 15 pr Gloves Prim Dressing: Vaseline Impregnated Gauze Dressing, 3x9 (in/in) 3 x Per Week/30 Days ary Secondary Dressing: (BORDER) Zetuvit Plus SILICONE BORDER Dressing 4x4 (in/in) 3 x Per Week/30 Days Discharge Instructions: Please do not put silicone bordered dressings under wraps. Use non-bordered dressing only. 1. That we have the patient continue to monitor for any signs of infection or worsening. Based on what I see I do believe that she is actually doing quite well with regard to the Vaseline gauze. 2. I am going to recommend I am going to recommend as well we continue with the Zetuvit to cover. 3. I am also can recommend the patient should continue to elevate her leg is much as possible to help with edema control the more that she can do the better in my opinion. We will see patient back for reevaluation in 1 week here in the clinic. If anything worsens or changes patient will contact our office for additional recommendations. Electronic Signature(s) Signed: 09/06/2023 10:03:10 AM By: Allen Derry PA-C Entered By: Allen Derry on 09/06/2023  07:03:10 -------------------------------------------------------------------------------- SuperBill Details Patient Name: Date of Service: [name], [name] 09/06/2023 Medical Record Number: [id number] Patient Account Number: [id number] Date of Birth/Sex: Treating RN: Jun 19, 1946 (77 y.o. [name] Primary [name] Provider: [name] Other Clinician: Referring Provider: Treating Provider/Extender: [name] in Treatment: 19 Diagnosis Coding ICD-10 Codes Code Description [phone] Chronic venous hypertension (idiopathic) with ulcer and inflammation of left lower extremity E11.622 Type 2 diabetes mellitus with other skin ulcer I89.0 Lymphedema, not elsewhere classified L97.822 Non-pressure chronic ulcer of other part of left lower leg with fat layer exposed [name], [name] ([id number]) [id number] Page 7 of 7 I10 Essential (primary) hypertension Facility Procedures : CPT4 Code: [id number] Description: [phone] - WOUND [name] VISIT-LEV 2 EST PT Modifier: Quantity: 1 Physician Procedures : CPT4 Code Description Modifier [id number] - WC PHYS LEVEL 3 - EST PT ICD-10 Diagnosis Description I87.332 Chronic venous hypertension (idiopathic) with ulcer and inflammation of left lower extremity E11.622 Type 2 diabetes mellitus with other skin  ulcer I89.0 Lymphedema, not elsewhere classified L97.822 Non-pressure chronic ulcer of other part of left lower leg with fat layer exposed Quantity: 1 Electronic Signature(s) Signed: 09/06/2023 10:03:38 AM By: Allen Derry PA-C Entered By: Allen Derry on 09/06/2023 07:03:38

## 2023-09-13 ENCOUNTER — Ambulatory Visit: Payer: Federal, State, Local not specified - PPO | Admitting: Physician Assistant

## 2023-09-20 ENCOUNTER — Encounter: Payer: Federal, State, Local not specified - PPO | Attending: Physician Assistant | Admitting: Physician Assistant

## 2023-09-20 DIAGNOSIS — E1151 Type 2 diabetes mellitus with diabetic peripheral angiopathy without gangrene: Secondary | ICD-10-CM | POA: Diagnosis not present

## 2023-09-20 DIAGNOSIS — E11622 Type 2 diabetes mellitus with other skin ulcer: Secondary | ICD-10-CM | POA: Diagnosis present

## 2023-09-20 DIAGNOSIS — L97822 Non-pressure chronic ulcer of other part of left lower leg with fat layer exposed: Secondary | ICD-10-CM | POA: Insufficient documentation

## 2023-09-20 DIAGNOSIS — I10 Essential (primary) hypertension: Secondary | ICD-10-CM | POA: Insufficient documentation

## 2023-09-20 DIAGNOSIS — I872 Venous insufficiency (chronic) (peripheral): Secondary | ICD-10-CM | POA: Insufficient documentation

## 2023-09-20 DIAGNOSIS — I890 Lymphedema, not elsewhere classified: Secondary | ICD-10-CM | POA: Insufficient documentation

## 2023-09-20 NOTE — Progress Notes (Signed)
ZYKERA, MROSS ([id number]) [id number] Page 1 of 7 Visit Report for 09/20/2023 Chief Complaint Document Details Patient Name: Date of Service: [name], [name] 09/20/2023 1:30 PM Medical Record Number: [id number] Patient Account Number: [id number] Date of Birth/Sex: Treating RN: Christine 09, 1947 (77 y.o. [name] in Treatment: 21 Information Obtained from: Patient Chief Complaint Left LE Ulcer Electronic Signature(s) Signed: 09/20/2023 1:33:18 PM By: Christine Derry PA-C Entered By: [name] on 09/20/2023 13:33:18 -------------------------------------------------------------------------------- HPI Details Patient Name: Date of Service: [name], [name] J. 09/20/2023 1:30 PM Medical Record Number: [id number] Patient Account Number: [id number] Date of Birth/Sex: Treating RN: May 25, 1946 (77 y.o. [name] in Treatment: 21 History of Present Illness HPI Description: 04-22-2023 upon evaluation patient appears to be doing somewhat poorly in regard to a wound on the left distal/lateral lower extremity and about the ankle region. She tells me this occurred on February 07, 2023 as a result of trying to get a very tight and new compression sock off. It was getting stuck and she somewhat twisted trying to get it off and pulled skin off. Since that time she has been having a hard time getting this to heal. Fortunately there does not appear to be any signs of active infection which is great news. She did have an ABI of 0.75 today. With that being said I am actually decently pleased with how the wound looks it does have some necrotic tissue but there is going to need to be some sharp  debridement to clear away some of the dead tissue as well today. Patient does have a history of diabetes mellitus type 2, lymphedema, hypertension, and chronic venous insufficiency. 05-02-2023 upon evaluation today patient appears to be doing well currently in regard to her wound. She is actually showing signs of improvement and very pleased in that regard and fortunately I do not see any signs of active infection locally or systemically at this time which is great news. No fevers, chills, nausea, vomiting, or diarrhea. 05-09-2023 upon evaluation today patient appears to be doing well currently in regard to her wound is not worse but is also not better. I discussed with her again today that we will get a need to likely compression wrap for her. With that being said she is adamantly opposed to this and wants to get 1 more week given something to try and I recommend to be seen by double layer Tubigrip to see if this can be beneficial. [name] ([id number]) [id number] Page 2 of 7 05-17-2023 upon evaluation today patient appears to be doing well currently in regard to her wound although it still very inflamed on the posterior aspect of her ankle. I discussed that with her today. With that being said I do not see any evidence of general worsening which is great news but also do not see a significant amount of improvement compared to last week in my opinion. I discussed this with her at this point today. 05-23-2023 upon evaluation today patient appears to be doing well currently in regard to her wound. This is actually showing some slough and biofilm on the surface of the wound. The good news is I do believe that we cleaned some of the soften the scar ischial little bit of improvement with that being said the periwound is being very irritated  I think the Vashe may have irritated her based on what she is telling me. Fortunately I do not see any evidence of active infection  locally nor systemically at this time which is great news. No fevers, chills, nausea, vomiting, or diarrhea. 7/22; wound on the left lateral lower leg in the setting of significant chronic venous hypertension. We have been using Prisma. She would not allow a compression wrap although she is using a support stocking. 06-06-2023 upon evaluation today patient's wound actually showing signs of significant improvement with regard to her wound. She has been tolerating the dressing changes without complication. Fortunately there does not appear to be any signs of active infection locally nor systemically at this time which is great news. No fevers, chills, nausea, vomiting, or diarrhea. 06-20-2023 upon evaluation today patient appears to be doing well currently in regard to her wound which is actually showing signs of significant improvement. Fortunately I do not see any evidence of worsening infection I do believe that the patient is moving in the right direction which is great news and in general I think that we are on the right track here. 06-27-2023 upon evaluation today patient appears to be doing well currently in regard to her wound. She has been tolerating the dressing changes without complication and in general I do feel like there were making really good headway towards complete closure. I do not see any signs of active infection locally or systemically at this time. 07-14-2023 upon evaluation today patient appears to be doing well currently in regard to her wound. She has been tolerating the dressing changes without complication. Fortunately I do not see any signs of active infection at this time which is great news. No fevers, chills, nausea, vomiting, or diarrhea. 07-19-2023 upon evaluation today patient appears to be doing well currently in regard to her wound although is not significantly smaller. I do think making a change in shift in the dressing would be a good idea here. My suggestion is gena be  that we go ahead and see about doing a Xeroform gauze dressing to see how this will do she is in agreement with that plan. 07-25-2023 upon evaluation today patient appears to be doing well currently in regard to her wound although she tells me the medicine burns pretty much all week. The visual appearance of the wound is significantly improved compared to what it was previous which is good news. 08-01-2023 upon evaluation today patient's wound actually appears to be showing signs of excellent improvement. Fortunately I do not see any signs of active infection locally or systemically which is great news I actually feel like the wound is measuring smaller this looks much better. 08-15-2023 upon evaluation today patient appears to be doing well currently in regard to her wound. She has been tolerating the dressing changes she has been using Vaseline or Vaseline gauze and this seems to be doing quite well. Fortunately I do not see any evidence of worsening overall and I feel like the patient is making good headway towards complete closure. 09-06-2023 upon evaluation today patient's wound is actually showing signs of excellent improvement in fact I think she is very close to complete resolution. Fortunately I do not see any signs of worsening overall and I do believe that the patient is making headway towards closure. 09-20-2023 upon evaluation today patient appears to be doing well currently in regard to her wound which actually appears to be completely healed. Fortunately I do not see any signs of  worsening overall I feel like that she is doing quite well and she has not had any drainage she tells me for a couple weeks. Electronic Signature(s) Signed: 09/20/2023 1:56:12 PM By: Christine Derry PA-C Entered By: [name] on 09/20/2023 13:56:12 -------------------------------------------------------------------------------- Physical Exam Details Patient Name: Date of Service: [name], [name] 09/20/2023  1:30 PM Medical Record Number: [id number] Patient Account Number: [id number] Date of Birth/Sex: Treating RN: 1946-10-03 (77 y.o. [name] in Treatment: 21 Constitutional Well-nourished and well-hydrated in no acute distress. Respiratory normal breathing without difficulty. Psychiatric this patient is able to make decisions and demonstrates good insight into disease process. Alert and Oriented x 3. pleasant and cooperative. [name], [name] ([id number]) [id number] Page 3 of 7 Notes Patient's wound showed signs of what appears to be a small divot I think that this has close done however with new skin I do not see anything that looks to be open at this point this is great news. No fevers, chills, nausea, vomiting, or diarrhea. Electronic Signature(s) Signed: 09/20/2023 1:56:24 PM By: Christine Derry PA-C Entered By: [name] on 09/20/2023 13:56:23 -------------------------------------------------------------------------------- Physician Orders Details Patient Name: Date of Service: [name], [name] J. 09/20/2023 1:30 PM Medical Record Number: [id number] Patient Account Number: [id number] Date of Birth/Sex: Treating RN: 12-02-1945 (77 y.o. [name] in Treatment: 21 The following information was scribed by: Yevonne Pax The information was scribed for: [name] Verbal / Phone Orders: No Diagnosis Coding ICD-10 Coding Code Description I87.332 Chronic venous hypertension (idiopathic) with ulcer and inflammation of left lower extremity E11.622 Type 2 diabetes mellitus with other skin ulcer I89.0 Lymphedema, not elsewhere classified L97.822 Non-pressure chronic  ulcer of other part of left lower leg with fat layer exposed I10 Essential (primary) hypertension Follow-up Appointments Return Appointment in 2 weeks. Bathing/ Shower/ Hygiene May shower; gently cleanse wound with antibacterial soap, rinse and pat dry prior to dressing wounds Other: - cover area with coverlet daily until return Edema Control - Orders / Instructions Elevate, Exercise Daily and A void Standing for Long Periods of Time. Elevate legs to the level of the heart and pump ankles as often as possible Elevate leg(s) parallel to the floor when sitting. Electronic Signature(s) Signed: 09/20/2023 2:00:57 PM By: [name] Signed: 09/22/2023 7:57:50 AM By: Christine Derry PA-C Entered By: Yevonne Pax on 09/20/2023 14:00:57 [name] ([id number]) [id number] Page 4 of 7 -------------------------------------------------------------------------------- Problem List Details Patient Name: Date of Service: [name], [name] 09/20/2023 1:30 PM Medical Record Number: [id number] Patient Account Number: [id number] Date of Birth/Sex: Treating RN: May 28, 1946 (77 y.o. 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 09/20/2023 1:30 PM Medical Record Number: [id number] Patient Account Number: [id number] Date of Birth/Sex: Treating RN: 1946-02-09 (77 y.o. Deaven, Swansen, Pearcy J ([id number]) [phone].pdf Page 5 of 7 Primary [name] Provider: Clydie [name] Other Clinician: Referring Provider: Treating Provider/Extender: [name] in Treatment: 21 Subjective Chief Complaint Information obtained from Patient Left LE Ulcer History of Present Illness (HPI) 04-22-2023 upon evaluation patient appears to be doing somewhat poorly in regard to a wound on the left distal/lateral lower extremity and about the ankle region. She tells me this occurred on February 07, 2023 as a result of trying to get a very tight and new compression sock off. It was getting stuck and she somewhat twisted trying to get it off and pulled skin off. Since that time she has been having a hard time getting this to heal. Fortunately there does not appear to be any signs of active infection which is great news. She did have an ABI of 0.75 today. With that being said I am actually decently pleased with how the wound looks it does have some necrotic tissue but there is going to need to be some sharp debridement to clear away some of the dead tissue as well today. Patient does have a history of diabetes mellitus type 2, lymphedema, hypertension, and chronic venous insufficiency. 05-02-2023 upon evaluation today patient appears to be doing well currently in regard to her wound. She is actually showing signs of improvement and very pleased in that regard and fortunately I do not see any signs of active infection locally or systemically at this time which is great news. No fevers, chills, nausea, vomiting, or  diarrhea. 05-09-2023 upon evaluation today patient appears to be doing well currently in regard to her wound is not worse but is also not better. I discussed with her again today that we will get a need to likely compression wrap for her. With that being said she is adamantly opposed to this and wants to get 1 more week given something to try and I recommend to be seen by double layer Tubigrip to see if this can be beneficial. 05-17-2023 upon evaluation today patient appears to be doing well currently in regard to her wound although it still very inflamed on the posterior aspect of her ankle. I discussed that with her today. With that being said I do not see any evidence of general worsening which is great news but also do not see a significant amount of improvement compared to last week in my opinion. I discussed this with her at this point today. 05-23-2023 upon evaluation today patient appears to be doing well currently in regard to her wound. This is actually showing some slough and biofilm on the surface of the wound. The good news is I do believe that we cleaned some of the soften the scar ischial little bit of improvement with that being said the periwound is being very irritated I think the Vashe may have irritated her based on what she is telling me. Fortunately I do not see any evidence of active infection locally nor systemically at this time which is great news. No fevers, chills, nausea, vomiting, or diarrhea. 7/22; wound on the left lateral lower leg in the setting of significant chronic venous hypertension. We have been using Prisma. She would not allow a compression wrap although she is using a support stocking. 06-06-2023 upon evaluation today patient's wound actually showing signs of significant improvement with regard to her  wound. She has been tolerating the dressing changes without complication. Fortunately there does not appear to be any signs of active infection locally nor  systemically at this time which is great news. No fevers, chills, nausea, vomiting, or diarrhea. 06-20-2023 upon evaluation today patient appears to be doing well currently in regard to her wound which is actually showing signs of significant improvement. Fortunately I do not see any evidence of worsening infection I do believe that the patient is moving in the right direction which is great news and in general I think that we are on the right track here. 06-27-2023 upon evaluation today patient appears to be doing well currently in regard to her wound. She has been tolerating the dressing changes without complication and in general I do feel like there were making really good headway towards complete closure. I do not see any signs of active infection locally or systemically at this time. 07-14-2023 upon evaluation today patient appears to be doing well currently in regard to her wound. She has been tolerating the dressing changes without complication. Fortunately I do not see any signs of active infection at this time which is great news. No fevers, chills, nausea, vomiting, or diarrhea. 07-19-2023 upon evaluation today patient appears to be doing well currently in regard to her wound although is not significantly smaller. I do think making a change in shift in the dressing would be a good idea here. My suggestion is gena be that we go ahead and see about doing a Xeroform gauze dressing to see how this will do she is in agreement with that plan. 07-25-2023 upon evaluation today patient appears to be doing well currently in regard to her wound although she tells me the medicine burns pretty much all week. The visual appearance of the wound is significantly improved compared to what it was previous which is good news. 08-01-2023 upon evaluation today patient's wound actually appears to be showing signs of excellent improvement. Fortunately I do not see any signs of active infection locally or systemically  which is great news I actually feel like the wound is measuring smaller this looks much better. 08-15-2023 upon evaluation today patient appears to be doing well currently in regard to her wound. She has been tolerating the dressing changes she has been using Vaseline or Vaseline gauze and this seems to be doing quite well. Fortunately I do not see any evidence of worsening overall and I feel like the patient is making good headway towards complete closure. 09-06-2023 upon evaluation today patient's wound is actually showing signs of excellent improvement in fact I think she is very close to complete resolution. Fortunately I do not see any signs of worsening overall and I do believe that the patient is making headway towards closure. 09-20-2023 upon evaluation today patient appears to be doing well currently in regard to her wound which actually appears to be completely healed. Fortunately I do not see any signs of worsening overall I feel like that she is doing quite well and she has not had any drainage she tells me for a couple weeks. Objective Constitutional Well-nourished and well-hydrated in no acute distress. [name], [name] ([id number]) [id number] Page 6 of 7 Vitals Time Taken: 1:28 PM, Height: 68 in, Weight: 200 lbs, BMI: 30.4, Temperature: 97.6 F, Pulse: 74 bpm, Respiratory Rate: 18 breaths/min, Blood Pressure: 168/64 mmHg. Respiratory normal breathing without difficulty. Psychiatric this patient is able to make decisions and demonstrates good insight into disease process. Alert and Oriented  x 3. pleasant and cooperative. General Notes: Patient's wound showed signs of what appears to be a small divot I think that this has close done however with new skin I do not see anything that looks to be open at this point this is great news. No fevers, chills, nausea, vomiting, or diarrhea. Integumentary (Hair, Skin) Wound #1 status is Healed - Epithelialized.  Original cause of wound was Trauma. The date acquired was: 02/07/2023. The wound has been in treatment 21 weeks. The wound is located on the Left,Distal,Lateral Lower Leg. The wound measures 0cm length x 0cm width x 0cm depth; 0cm^2 area and 0cm^3 volume. There is no tunneling or undermining noted. There is a none present amount of drainage noted. There is no granulation within the wound bed. There is no necrotic tissue within the wound bed. Assessment Active Problems ICD-10 Chronic venous hypertension (idiopathic) with ulcer and inflammation of left lower extremity Type 2 diabetes mellitus with other skin ulcer Lymphedema, not elsewhere classified Non-pressure chronic ulcer of other part of left lower leg with fat layer exposed Essential (primary) hypertension Plan 1. Based on what I am seeing I feel like the patient's wound is completely healed though with a small divot. There is no drainage at this point. 2. I would recommend based on what we are seeing that we have the patient continue to use a Band-Aid just to cover the area and monitor will make sure that hopefully nothing worsens but at the same time right now she is noted no drainage I really feel like this is close which is good to protect the region. We will see patient back for reevaluation in 1 week here in the clinic. If anything worsens or changes patient will contact our office for additional recommendations. Electronic Signature(s) Signed: 09/20/2023 1:57:12 PM By: Christine Derry PA-C Entered By: [name] on 09/20/2023 13:57:11 -------------------------------------------------------------------------------- SuperBill Details Patient Name: Date of Service: [name], [name] 09/20/2023 Medical Record Number: [id number] Patient Account Number: [id number] Date of Birth/Sex: Treating RN: 07-Oct-1946 (77 y.o. [name] Primary [name] Provider: Clydie [name] Other Clinician: Referring Provider: Treating Provider/Extender:  [name] in Treatment: 21 Diagnosis Coding ICD-10 Codes Code Description WYLDER, LAVECK ([id number]) [id number] Page 7 of 7 [phone] Chronic venous hypertension (idiopathic) with ulcer and inflammation of left lower extremity E11.622 Type 2 diabetes mellitus with other skin ulcer I89.0 Lymphedema, not elsewhere classified L97.822 Non-pressure chronic ulcer of other part of left lower leg with fat layer exposed I10 Essential (primary) hypertension Facility Procedures : CPT4 Code: [id number] Description: 52841 - WOUND [name] VISIT-LEV 2 EST PT Modifier: Quantity: 1 Physician Procedures : CPT4 Code Description Modifier [id number] - WC PHYS LEVEL 3 - EST PT ICD-10 Diagnosis Description I87.332 Chronic venous hypertension (idiopathic) with ulcer and inflammation of left lower extremity E11.622 Type 2 diabetes mellitus with other skin  ulcer I89.0 Lymphedema, not elsewhere classified L97.822 Non-pressure chronic ulcer of other part of left lower leg with fat layer exposed Quantity: 1 Electronic Signature(s) Signed: 09/20/2023 2:02:13 PM By: [name] Signed: 09/22/2023 7:57:50 AM By: Christine Derry PA-C Previous Signature: 09/20/2023 1:57:25 PM Version By: Christine Derry PA-C Entered By: Yevonne Pax on 09/20/2023 14:02:13

## 2023-09-21 NOTE — Progress Notes (Signed)
MISTEE, Christine ([id number]) [phone].pdf Page 1 of 7 Visit Report for 09/20/2023 Arrival Information Details Patient Name: Date of Service: [name], [name] 09/20/2023 1:30 PM Medical Record Number: [id number] Patient Account Number: [id number] Date of Birth/Sex: Treating RN: Feb 03, 1946 (77 y.o. [name] Primary Care Baani Bober: [name] Other Clinician: Referring Aamori Mcmasters: Treating Donnabelle Blanchard/Extender: [name] in Treatment: 21 Visit Information History Since Last Visit Added or deleted any medications: No Patient Arrived: Ambulatory Any new allergies or adverse reactions: No Arrival Time: 13:28 Had a fall or experienced change in No Accompanied By: self activities of daily living that may affect Transfer Assistance: None risk of falls: Patient Identification Verified: Yes Signs or symptoms of abuse/neglect since last visito No Secondary Verification Process Completed: Yes Hospitalized since last visit: No Patient Requires Transmission-Based Precautions: No Implantable device outside of the clinic excluding No Patient Has Alerts: No cellular tissue based products placed in the center since last visit: Has Dressing in Place as Prescribed: Yes Pain Present Now: No Electronic Signature(s) Signed: 09/21/2023 4:42:48 PM By: [name] Entered By: Yevonne Pax on 09/20/2023 10:28:38 -------------------------------------------------------------------------------- Clinic Level of Care Assessment Details Patient Name: Date of Service: [name], Christine 09/20/2023 1:30 PM Medical Record Number: [id number] Patient Account Number: [id number] Date of Birth/Sex: Treating RN: 1945-12-21 (77 y.o. [name] Primary Care Ekam Bonebrake: [name] Other Clinician: Referring Jaylea Plourde: Treating Lekesha Claw/Extender: [name] in Treatment: 21 Clinic Level of Care Assessment  Items TOOL 4 Quantity Score X- 1 0 Use when only an EandM is performed on FOLLOW-UP visit ASSESSMENTS - Nursing Assessment / Reassessment X- 1 10 Reassessment of Co-morbidities (includes updates in patient status) X- 1 5 Reassessment of Adherence to Treatment Plan VERBAL, HOLSTEN ([id number]) [id number] Page 2 of 7 ASSESSMENTS - Wound and Skin A ssessment / Reassessment X - Simple Wound Assessment / Reassessment - one wound 1 5 []  - 0 Complex Wound Assessment / Reassessment - multiple wounds []  - 0 Dermatologic / Skin Assessment (not related to wound area) ASSESSMENTS - Focused Assessment []  - 0 Circumferential Edema Measurements - multi extremities []  - 0 Nutritional Assessment / Counseling / Intervention []  - 0 Lower Extremity Assessment (monofilament, tuning fork, pulses) []  - 0 Peripheral Arterial Disease Assessment (using hand held doppler) ASSESSMENTS - Ostomy and/or Continence Assessment and Care []  - 0 Incontinence Assessment and Management []  - 0 Ostomy Care Assessment and Management (repouching, etc.) PROCESS - Coordination of Care X - Simple Patient / Family Education for ongoing care 1 15 []  - 0 Complex (extensive) Patient / Family Education for ongoing care []  - 0 Staff obtains Chiropractor, Records, T Results / Process Orders est []  - 0 Staff telephones HHA, Nursing Homes / Clarify orders / etc []  - 0 Routine Transfer to another Facility (non-emergent condition) []  - 0 Routine Hospital Admission (non-emergent condition) []  - 0 New Admissions / Manufacturing engineer / Ordering NPWT Apligraf, etc. , []  - 0 Emergency Hospital Admission (emergent condition) X- 1 10 Simple Discharge Coordination []  - 0 Complex (extensive) Discharge Coordination PROCESS - Special Needs []  - 0 Pediatric / Minor Patient Management []  - 0 Isolation Patient Management []  - 0 Hearing / Language / Visual special needs []  - 0 Assessment of  Community assistance (transportation, D/C planning, etc.) []  - 0 Additional assistance / Altered mentation []  - 0 Support Surface(s) Assessment (bed, cushion, seat, etc.) INTERVENTIONS - Wound Cleansing / Measurement X - Simple Wound Cleansing -  one wound 1 5 []  - 0 Complex Wound Cleansing - multiple wounds X- 1 5 Wound Imaging (photographs - any number of wounds) []  - 0 Wound Tracing (instead of photographs) X- 1 5 Simple Wound Measurement - one wound []  - 0 Complex Wound Measurement - multiple wounds INTERVENTIONS - Wound Dressings X - Small Wound Dressing one or multiple wounds 1 10 []  - 0 Medium Wound Dressing one or multiple wounds []  - 0 Large Wound Dressing one or multiple wounds []  - 0 Application of Medications - topical []  - 0 Application of Medications - injection INTERVENTIONS - Miscellaneous []  - 0 External ear exam SEBRENA, LANGELL ([id number]) [id number] Page 3 of 7 []  - 0 Specimen Collection (cultures, biopsies, blood, body fluids, etc.) []  - 0 Specimen(s) / Culture(s) sent or taken to Lab for analysis []  - 0 Patient Transfer (multiple staff / Michiel Sites Lift / Similar devices) []  - 0 Simple Staple / Suture removal (25 or less) []  - 0 Complex Staple / Suture removal (26 or more) []  - 0 Hypo / Hyperglycemic Management (close monitor of Blood Glucose) []  - 0 Ankle / Brachial Index (ABI) - do not check if billed separately X- 1 5 Vital Signs Has the patient been seen at the hospital within the last three years: Yes Total Score: 75 Level Of Care: New/Established - Level 2 Electronic Signature(s) Signed: 09/21/2023 4:42:48 PM By: [name] Entered By: Yevonne Pax on 09/20/2023 11:01:56 -------------------------------------------------------------------------------- Encounter Discharge Information Details Patient Name: Date of Service: [name], [name]. 09/20/2023 1:30 PM Medical Record Number: [id number] Patient  Account Number: [id number] Date of Birth/Sex: Treating RN: [birth year] (77 y.o. [name] Primary Care Ezel Vallone: [name] Other Clinician: Referring Dyasia Firestine: Treating Drayson Dorko/Extender: [name] in Treatment: 21 Encounter Discharge Information Items Discharge Condition: Stable Ambulatory Status: Ambulatory Discharge Destination: Home Transportation: Private Auto Accompanied By: self Schedule Follow-up Appointment: Yes Clinical Summary of Care: Electronic Signature(s) Signed: 09/20/2023 2:03:46 PM By: [name] Previous Signature: 09/20/2023 2:03:21 PM Version By: [name] Entered By: Yevonne Pax on 09/20/2023 11:03:46 Multi-Disciplinary Care Plan Details -------------------------------------------------------------------------------- Gayla Doss ([id number]) [id number] Page 4 of 7 Patient Name: Date of Service: XANIA, GANSTER 09/20/2023 1:30 PM Medical Record Number: [id number] Patient Account Number: [id number] Date of Birth/Sex: Treating RN: 16-Apr-1946 (77 y.o. [name] Primary Care Jc Veron: [name] Other Clinician: Referring Maijor Hornig: Treating Anastaisa Wooding/Extender: [name] in Treatment: 21 Active Inactive Wound/Skin Impairment Nursing Diagnoses: Knowledge deficit related to ulceration/compromised skin integrity Goals: Patient/caregiver will verbalize understanding of skin care regimen Date Initiated: 04/22/2023 Target Resolution Date: 10/22/2023 Goal Status: Active Ulcer/skin breakdown will have a volume reduction of 30% by week 4 Date Initiated: 04/22/2023 Date Inactivated: 06/27/2023 Target Resolution Date: 05/22/2023 Goal Status: Unmet Unmet Reason: comorbidities Ulcer/skin breakdown will have a volume reduction of 50% by week 8 Date Initiated: 04/22/2023 Date Inactivated: 06/27/2023 Target Resolution Date: 06/22/2023 Goal Status:  Unmet Unmet Reason: comorbidities Ulcer/skin breakdown will have a volume reduction of 80% by week 12 Date Initiated: 04/22/2023 Date Inactivated: 07/25/2023 Target Resolution Date: 07/23/2023 Goal Status: Unmet Unmet Reason: comorbidities Ulcer/skin breakdown will heal within 14 weeks Date Initiated: 04/22/2023 Date Inactivated: 09/06/2023 Target Resolution Date: 08/22/2023 Goal Status: Unmet Unmet Reason: comorbidities Interventions: Assess patient/caregiver ability to obtain necessary supplies Assess patient/caregiver ability to perform ulcer/skin care regimen upon admission and as needed Assess ulceration(s) every visit Notes: Electronic Signature(s) Signed: 09/20/2023 2:02:28 PM By: Yevonne Pax  RN Entered By: Yevonne Pax on 09/20/2023 11:02:28 -------------------------------------------------------------------------------- Pain Assessment Details Patient Name: Date of Service: DEANI, MELBOURNE 09/20/2023 1:30 PM Medical Record Number: [id number] Patient Account Number: [id number] Date of Birth/Sex: Treating RN: Sep 04, 1946 (77 y.o. [name] Primary Care Neena Beecham: [name] Other Clinician: Referring Ziyah Cordoba: Treating Adaiah Morken/Extender: [name] in Treatment: 21 Active Problems Location of Pain Severity and Description of Pain Patient Has Paino No Site Locations NYALEE, ELMQUIST ([id number]) [phone].pdf Page 5 of 7 Pain Management and Medication Current Pain Management: Electronic Signature(s) Signed: 09/21/2023 4:42:48 PM By: [name] Entered By: Yevonne Pax on 09/20/2023 10:29:18 -------------------------------------------------------------------------------- Patient/Caregiver Education Details Patient Name: Date of Service: [name] 11/12/2024andnbsp1:30 PM Medical Record Number: [id number] Patient Account Number: [id number] Date of Birth/Gender: Treating RN: Feb 12, 1946 (77  y.o. [name] Primary Care Physician: [name] Other Clinician: Referring Physician: Treating Physician/Extender: [name] in Treatment: 21 Education Assessment Education Provided To: Patient Education Topics Provided Wound/Skin Impairment: Handouts: Caring for Your Ulcer Methods: Explain/Verbal Responses: State content correctly Electronic Signature(s) Signed: 09/21/2023 4:42:48 PM By: [name] Entered By: Yevonne Pax on 09/20/2023 11:02:41 Kanitz, Nestor Ramp ([id number]) [id number] Page 6 of 7 -------------------------------------------------------------------------------- Wound Assessment Details Patient Name: Date of Service: JENNYFER, ABBY 09/20/2023 1:30 PM Medical Record Number: [id number] Patient Account Number: [id number] Date of Birth/Sex: Treating RN: [birth year] (77 y.o. [name] Primary Care Kaylah Chiasson: [name] Other Clinician: Referring Joeann Steppe: Treating Anita Mcadory/Extender: [name] in Treatment: 21 Wound Status Wound Number: 1 Primary Etiology: Diabetic Wound/Ulcer of the Lower Extremity Wound Location: Left, Distal, Lateral Lower Leg Wound Status: Healed - Epithelialized Wounding Event: Trauma Comorbid History: Lymphedema, Hypertension, Type II Diabetes Date Acquired: 02/07/2023 Weeks Of Treatment: 21 Clustered Wound: No Wound Measurements Length: (cm) Width: (cm) Depth: (cm) Area: (cm) Volume: (cm) 0 % Reduction in Area: 100% 0 % Reduction in Volume: 100% 0 Epithelialization: Large (67-100%) 0 Tunneling: No 0 Undermining: No Wound Description Classification: Grade 1 Exudate Amount: None Present Foul Odor After Cleansing: No Slough/Fibrino No Wound Bed Granulation Amount: None Present (0%) Necrotic Amount: None Present (0%) Treatment Notes Wound #1 (Lower Leg) Wound Laterality: Left, Lateral, Distal Cleanser Peri-Wound  Care Topical Primary Dressing Secondary Dressing Secured With Compression Wrap Compression Stockings Add-Ons Electronic Signature(s) Signed: 09/21/2023 4:42:48 PM By: [name] Entered By: Yevonne Pax on 09/20/2023 10:48:30 Kurka, Nestor Ramp ([id number]) [id number] Page 7 of 7 -------------------------------------------------------------------------------- Vitals Details Patient Name: Date of Service: KAYDEN, TONNESEN 09/20/2023 1:30 PM Medical Record Number: [id number] Patient Account Number: [id number] Date of Birth/Sex: Treating RN: [birth year] (77 y.o. [name] Primary Care Terryon Pineiro: [name] Other Clinician: Referring Keven Soucy: Treating Avalie Oconnor/Extender: [name] in Treatment: 21 Vital Signs Time Taken: 13:28 Temperature (F): 97.6 Height (in): 68 Pulse (bpm): 74 Weight (lbs): 200 Respiratory Rate (breaths/min): 18 Body Mass Index (BMI): 30.4 Blood Pressure (mmHg): 168/64 Reference Range: 80 - 120 mg / dl Electronic Signature(s) Signed: 09/21/2023 4:42:48 PM By: [name] Entered By: Yevonne Pax on 09/20/2023 10:29:10

## 2023-10-04 ENCOUNTER — Ambulatory Visit: Payer: Federal, State, Local not specified - PPO | Admitting: Physician Assistant

## 2023-10-20 ENCOUNTER — Encounter: Payer: Federal, State, Local not specified - PPO | Attending: Physician Assistant | Admitting: Physician Assistant

## 2023-10-20 DIAGNOSIS — L97822 Non-pressure chronic ulcer of other part of left lower leg with fat layer exposed: Secondary | ICD-10-CM | POA: Insufficient documentation

## 2023-10-20 DIAGNOSIS — E11622 Type 2 diabetes mellitus with other skin ulcer: Secondary | ICD-10-CM | POA: Insufficient documentation

## 2023-10-20 DIAGNOSIS — I872 Venous insufficiency (chronic) (peripheral): Secondary | ICD-10-CM | POA: Diagnosis not present

## 2023-10-20 DIAGNOSIS — I890 Lymphedema, not elsewhere classified: Secondary | ICD-10-CM | POA: Insufficient documentation

## 2023-10-20 DIAGNOSIS — I87332 Chronic venous hypertension (idiopathic) with ulcer and inflammation of left lower extremity: Secondary | ICD-10-CM | POA: Diagnosis present

## 2023-10-20 DIAGNOSIS — I10 Essential (primary) hypertension: Secondary | ICD-10-CM | POA: Insufficient documentation

## 2023-10-20 NOTE — Progress Notes (Signed)
Christine, [name] ([id number]) [id number] Page 1 of 6 Visit Report for 10/20/2023 Arrival Information Details Patient Name: Date of Service: [name], LITTIG 10/20/2023 8:00 A M Medical Record Number: [id number] Patient Account Number: [id number] Date of Birth/Sex: Treating RN: 26-Sep-1946 (77 y.o. [name] Primary Care Jammal Sarr: [name] Other Clinician: Referring Jurline Folger: Treating Con Arganbright/Extender: [name] in Treatment: 25 Visit Information History Since Last Visit Added or deleted any medications: No Patient Arrived: Ambulatory Any new allergies or adverse reactions: No Arrival Time: 08:08 Had a fall or experienced change in No Accompanied By: self activities of daily living that may affect Transfer Assistance: None risk of falls: Patient Identification Verified: Yes Signs or symptoms of abuse/neglect since last visito No Secondary Verification Process Completed: Yes Hospitalized since last visit: No Patient Requires Transmission-Based Precautions: No Implantable device outside of the clinic excluding No Patient Has Alerts: No cellular tissue based products placed in the center since last visit: Has Dressing in Place as Prescribed: Yes Has Compression in Place as Prescribed: Yes Pain Present Now: No Electronic Signature(s) Signed: 10/20/2023 2:31:23 PM By: [name] Entered By: Yevonne Pax on 10/20/2023 08:08:54 -------------------------------------------------------------------------------- Clinic Level of Care Assessment Details Patient Name: Date of Service: [name], [name] 10/20/2023 8:00 A M Medical Record Number: [id number] Patient Account Number: [id number] Date of Birth/Sex: Treating RN: 1946/06/09 (77 y.o. [name] Primary Care Cheyrl Buley: [name] Other Clinician: Referring Tamerra Merkley: Treating Zuley Lutter/Extender: [name] in Treatment:  25 Clinic Level of Care Assessment Items TOOL 4 Quantity Score X- 1 0 Use when only an EandM is performed on FOLLOW-UP visit ASSESSMENTS - Nursing Assessment / Reassessment X- 1 10 Reassessment of Co-morbidities (includes updates in patient status) X- 1 5 Reassessment of Adherence to Treatment Plan ELVERNA, KORST ([id number]) [id number] Page 2 of 6 ASSESSMENTS - Wound and Skin A ssessment / Reassessment []  - 0 Simple Wound Assessment / Reassessment - one wound []  - 0 Complex Wound Assessment / Reassessment - multiple wounds []  - 0 Dermatologic / Skin Assessment (not related to wound area) ASSESSMENTS - Focused Assessment []  - 0 Circumferential Edema Measurements - multi extremities []  - 0 Nutritional Assessment / Counseling / Intervention []  - 0 Lower Extremity Assessment (monofilament, tuning fork, pulses) []  - 0 Peripheral Arterial Disease Assessment (using hand held doppler) ASSESSMENTS - Ostomy and/or Continence Assessment and Care []  - 0 Incontinence Assessment and Management []  - 0 Ostomy Care Assessment and Management (repouching, etc.) PROCESS - Coordination of Care X - Simple Patient / Family Education for ongoing care 1 15 []  - 0 Complex (extensive) Patient / Family Education for ongoing care []  - 0 Staff obtains Chiropractor, Records, T Results / Process Orders est []  - 0 Staff telephones HHA, Nursing Homes / Clarify orders / etc []  - 0 Routine Transfer to another Facility (non-emergent condition) []  - 0 Routine Hospital Admission (non-emergent condition) []  - 0 New Admissions / Manufacturing engineer / Ordering NPWT Apligraf, etc. , []  - 0 Emergency Hospital Admission (emergent condition) X- 1 10 Simple Discharge Coordination []  - 0 Complex (extensive) Discharge Coordination PROCESS - Special Needs []  - 0 Pediatric / Minor Patient Management []  - 0 Isolation Patient Management []  - 0 Hearing / Language / Visual special  needs []  - 0 Assessment of Community assistance (transportation, D/C planning, etc.) []  - 0 Additional assistance / Altered mentation []  - 0 Support Surface(s) Assessment (bed, cushion, seat, etc.) INTERVENTIONS - Wound Cleansing /  Measurement []  - 0 Simple Wound Cleansing - one wound []  - 0 Complex Wound Cleansing - multiple wounds []  - 0 Wound Imaging (photographs - any number of wounds) []  - 0 Wound Tracing (instead of photographs) []  - 0 Simple Wound Measurement - one wound []  - 0 Complex Wound Measurement - multiple wounds INTERVENTIONS - Wound Dressings []  - 0 Small Wound Dressing one or multiple wounds []  - 0 Medium Wound Dressing one or multiple wounds []  - 0 Large Wound Dressing one or multiple wounds []  - 0 Application of Medications - topical []  - 0 Application of Medications - injection INTERVENTIONS - Miscellaneous []  - 0 External ear exam WILHEMINA, GARIEPY ([id number]) [id number] Page 3 of 6 []  - 0 Specimen Collection (cultures, biopsies, blood, body fluids, etc.) []  - 0 Specimen(s) / Culture(s) sent or taken to Lab for analysis []  - 0 Patient Transfer (multiple staff / Michiel Sites Lift / Similar devices) []  - 0 Simple Staple / Suture removal (25 or less) []  - 0 Complex Staple / Suture removal (26 or more) []  - 0 Hypo / Hyperglycemic Management (close monitor of Blood Glucose) []  - 0 Ankle / Brachial Index (ABI) - do not check if billed separately X- 1 5 Vital Signs Has the patient been seen at the hospital within the last three years: Yes Total Score: 45 Level Of Care: New/Established - Level 2 Electronic Signature(s) Signed: 10/20/2023 2:31:23 PM By: [name] Entered By: Yevonne Pax on 10/20/2023 08:38:33 -------------------------------------------------------------------------------- Encounter Discharge Information Details Patient Name: Date of Service: Christine Care, [name] J. 10/20/2023 8:00 A M Medical Record  Number: [id number] Patient Account Number: [id number] Date of Birth/Sex: Treating RN: 03-27-1946 (77 y.o. [name] Primary Care Davine Coba: [name] Other Clinician: Referring Lovella Hardie: Treating Geeta Dworkin/Extender: [name] in Treatment: 25 Encounter Discharge Information Items Discharge Condition: Stable Ambulatory Status: Ambulatory Discharge Destination: Home Transportation: Private Auto Accompanied By: self Schedule Follow-up Appointment: Yes Clinical Summary of Care: Electronic Signature(s) Signed: 10/20/2023 8:41:43 AM By: [name] Entered By: Yevonne Pax on 10/20/2023 08:41:43 Lower Extremity Assessment Details -------------------------------------------------------------------------------- Gayla Doss ([id number]) [id number] Page 4 of 6 Patient Name: Date of Service: [name], [name] 10/20/2023 8:00 A M Medical Record Number: [id number] Patient Account Number: [id number] Date of Birth/Sex: Treating RN: 12/23/1945 (77 y.o. [name] Primary Care Knoxx Boeding: [name] Other Clinician: Referring Maddux First: Treating Santos Sollenberger/Extender: [name] in Treatment: 25 Electronic Signature(s) Signed: 10/20/2023 2:31:23 PM By: [name] Entered By: Yevonne Pax on 10/20/2023 08:12:10 -------------------------------------------------------------------------------- Multi Wound Chart Details Patient Name: Date of Service: Christine Care, [name] J. 10/20/2023 8:00 A M Medical Record Number: [id number] Patient Account Number: [id number] Date of Birth/Sex: Treating RN: 1946/06/06 (77 y.o. [name] Primary Care Vail Basista: [name] Other Clinician: Referring Ji Feldner: Treating Rhyen Mazariego/Extender: [name] in Treatment: 25 Vital Signs Height(in): 68 Pulse(bpm): 66 Weight(lbs): 200 Blood Pressure(mmHg): 136/57 Body Mass  Index(BMI): 30.4 Temperature(F): 97.8 Respiratory Rate(breaths/min): 18 [Treatment Notes:Wound Assessments Treatment Notes] Electronic Signature(s) Signed: 10/20/2023 8:36:26 AM By: [name] Entered By: Yevonne Pax on 10/20/2023 08:36:26 -------------------------------------------------------------------------------- Multi-Disciplinary Care Plan Details Patient Name: Date of Service: Christine Care, [name] J. 10/20/2023 8:00 A M Medical Record Number: [id number] Patient Account Number: [id number] Date of Birth/Sex: Treating RN: 29-Nov-1945 (77 y.o. [name] Primary Care Sameerah Nachtigal: [name] Other Clinician: Referring Keneshia Tena: Treating Lequan Dobratz/Extender: [name] in Treatment: [address] Amisha, Merryman Nestor Ramp ([id number])  [id number] Page 5 of 6 Electronic Signature(s) Signed: 10/20/2023 8:40:05 AM By: [name] Entered By: Yevonne Pax on 10/20/2023 08:40:05 -------------------------------------------------------------------------------- Pain Assessment Details Patient Name: Date of Service: [name], [name] 10/20/2023 8:00 A M Medical Record Number: [id number] Patient Account Number: [id number] Date of Birth/Sex: Treating RN: 1945-11-15 (77 y.o. [name] Primary Care Tawana Pasch: [name] Other Clinician: Referring Toye Rouillard: Treating Abayomi Pattison/Extender: [name] in Treatment: 25 Active Problems Location of Pain Severity and Description of Pain Patient Has Paino No Site Locations Pain Management and Medication Current Pain Management: Electronic Signature(s) Signed: 10/20/2023 2:31:23 PM By: [name] Entered By: Yevonne Pax on 10/20/2023 08:09:44 Gellis, Nestor Ramp ([id number]) [id number] Page 6 of 6 -------------------------------------------------------------------------------- Patient/Caregiver Education  Details Patient Name: Date of Service: [name], BARTHELL 12/12/2024andnbsp8:00 A M Medical Record Number: [id number] Patient Account Number: [id number] Date of Birth/Gender: Treating RN: [birth year] (77 y.o. [name] Primary Care Physician: [name] Other Clinician: Referring Physician: Treating Physician/Extender: [name] in Treatment: 25 Education Assessment Education Provided To: Patient Education Topics Provided Wound/Skin Impairment: Handouts: Other: discharge Administrator) Signed: 10/20/2023 2:31:23 PM By: [name] Entered By: Yevonne Pax on 10/20/2023 08:40:28 -------------------------------------------------------------------------------- Vitals Details Patient Name: Date of Service: Christine Care, [name] J. 10/20/2023 8:00 A M Medical Record Number: [id number] Patient Account Number: [id number] Date of Birth/Sex: Treating RN: 12-08-1945 (77 y.o. [name] Primary Care Laurieanne Galloway: [name] Other Clinician: Referring Kirbi Farrugia: Treating Nathaniel Wakeley/Extender: [name] in Treatment: 25 Vital Signs Time Taken: 08:08 Temperature (F): 97.8 Height (in): 68 Pulse (bpm): 66 Weight (lbs): 200 Respiratory Rate (breaths/min): 18 Body Mass Index (BMI): 30.4 Blood Pressure (mmHg): 136/57 Reference Range: 80 - 120 mg / dl Electronic Signature(s) Signed: 10/20/2023 2:31:23 PM By: [name] Entered By: Yevonne Pax on 10/20/2023 08:09:31

## 2023-10-20 NOTE — Progress Notes (Addendum)
KIAIRA, PAKKALA ([id number]) [id number] Page 1 of 7 Visit Report for 10/20/2023 Chief Complaint Document Details Patient Name: Date of Service: [name], [name] 10/20/2023 8:00 A M Medical Record Number: [id number] Patient Account Number: [id number] Date of Birth/Sex: Treating RN: Feb 20, 1946 (77 y.o. [name] Primary [name] Provider: [name] Other Clinician: Referring Provider: Treating Provider/Extender: [name] in Treatment: 25 Information Obtained from: Patient Chief Complaint Left LE Ulcer Electronic Signature(s) Signed: 10/20/2023 8:35:51 AM By: Allen Derry PA-C Entered By: Allen Derry on 10/20/2023 05:35:51 -------------------------------------------------------------------------------- HPI Details Patient Name: Date of Service: [name], [name] J. 10/20/2023 8:00 A M Medical Record Number: [id number] Patient Account Number: [id number] Date of Birth/Sex: Treating RN: 30-May-1946 (77 y.o. [name] Primary [name] Provider: [name] Other Clinician: Referring Provider: Treating Provider/Extender: [name] in Treatment: 25 History of Present Illness HPI Description: 04-22-2023 upon evaluation patient appears to be doing somewhat poorly in regard to a wound on the left distal/lateral lower extremity and about the ankle region. She tells me this occurred on February 07, 2023 as a result of trying to get a very tight and new compression sock off. It was getting stuck and she somewhat twisted trying to get it off and pulled skin off. Since that time she has been having a hard time getting this to heal. Fortunately there does not appear to be any signs of active infection which is great news. She did have an ABI of 0.75 today. With that being said I am actually decently pleased with how the wound looks it does have some necrotic tissue but there is going to need to be some  sharp debridement to clear away some of the dead tissue as well today. Patient does have a history of diabetes mellitus type 2, lymphedema, hypertension, and chronic venous insufficiency. 05-02-2023 upon evaluation today patient appears to be doing well currently in regard to her wound. She is actually showing signs of improvement and very pleased in that regard and fortunately I do not see any signs of active infection locally or systemically at this time which is great news. No fevers, chills, nausea, vomiting, or diarrhea. 05-09-2023 upon evaluation today patient appears to be doing well currently in regard to her wound is not worse but is also not better. I discussed with her again today that we will get a need to likely compression wrap for her. With that being said she is adamantly opposed to this and wants to get 1 more week given something to try and I recommend to be seen by double layer Tubigrip to see if this can be beneficial. [name], [name] ([id number]) [id number] Page 2 of 7 05-17-2023 upon evaluation today patient appears to be doing well currently in regard to her wound although it still very inflamed on the posterior aspect of her ankle. I discussed that with her today. With that being said I do not see any evidence of general worsening which is great news but also do not see a significant amount of improvement compared to last week in my opinion. I discussed this with her at this point today. 05-23-2023 upon evaluation today patient appears to be doing well currently in regard to her wound. This is actually showing some slough and biofilm on the surface of the wound. The good news is I do believe that we cleaned some of the soften the scar ischial little bit of improvement with that being said the periwound is being  very irritated I think the Vashe may have irritated her based on what she is telling me. Fortunately I do not see any evidence of  active infection locally nor systemically at this time which is great news. No fevers, chills, nausea, vomiting, or diarrhea. 7/22; wound on the left lateral lower leg in the setting of significant chronic venous hypertension. We have been using Prisma. She would not allow a compression wrap although she is using a support stocking. 06-06-2023 upon evaluation today patient's wound actually showing signs of significant improvement with regard to her wound. She has been tolerating the dressing changes without complication. Fortunately there does not appear to be any signs of active infection locally nor systemically at this time which is great news. No fevers, chills, nausea, vomiting, or diarrhea. 06-20-2023 upon evaluation today patient appears to be doing well currently in regard to her wound which is actually showing signs of significant improvement. Fortunately I do not see any evidence of worsening infection I do believe that the patient is moving in the right direction which is great news and in general I think that we are on the right track here. 06-27-2023 upon evaluation today patient appears to be doing well currently in regard to her wound. She has been tolerating the dressing changes without complication and in general I do feel like there were making really good headway towards complete closure. I do not see any signs of active infection locally or systemically at this time. 07-14-2023 upon evaluation today patient appears to be doing well currently in regard to her wound. She has been tolerating the dressing changes without complication. Fortunately I do not see any signs of active infection at this time which is great news. No fevers, chills, nausea, vomiting, or diarrhea. 07-19-2023 upon evaluation today patient appears to be doing well currently in regard to her wound although is not significantly smaller. I do think making a change in shift in the dressing would be a good idea here. My  suggestion is gena be that we go ahead and see about doing a Xeroform gauze dressing to see how this will do she is in agreement with that plan. 07-25-2023 upon evaluation today patient appears to be doing well currently in regard to her wound although she tells me the medicine burns pretty much all week. The visual appearance of the wound is significantly improved compared to what it was previous which is good news. 08-01-2023 upon evaluation today patient's wound actually appears to be showing signs of excellent improvement. Fortunately I do not see any signs of active infection locally or systemically which is great news I actually feel like the wound is measuring smaller this looks much better. 08-15-2023 upon evaluation today patient appears to be doing well currently in regard to her wound. She has been tolerating the dressing changes she has been using Vaseline or Vaseline gauze and this seems to be doing quite well. Fortunately I do not see any evidence of worsening overall and I feel like the patient is making good headway towards complete closure. 09-06-2023 upon evaluation today patient's wound is actually showing signs of excellent improvement in fact I think she is very close to complete resolution. Fortunately I do not see any signs of worsening overall and I do believe that the patient is making headway towards closure. 09-20-2023 upon evaluation today patient appears to be doing well currently in regard to her wound which actually appears to be completely healed. Fortunately I do not see any  signs of worsening overall I feel like that she is doing quite well and she has not had any drainage she tells me for a couple weeks. 10-20-2023 upon evaluation patient's wound actually appears to be completely healed. Fortunately I do not see any signs of infection at this time and in general I do believe that the patient is making excellent headway here towards closure which is great  news. Electronic Signature(s) Signed: 10/20/2023 9:03:31 AM By: Allen Derry PA-C Entered By: Allen Derry on 10/20/2023 06:03:31 -------------------------------------------------------------------------------- Physical Exam Details Patient Name: Date of Service: [name], [name] 10/20/2023 8:00 A M Medical Record Number: [id number] Patient Account Number: [id number] Date of Birth/Sex: Treating RN: 1946-10-11 (77 y.o. [name] Primary [name] Provider: [name] Other Clinician: Referring Provider: Treating Provider/Extender: [name] in Treatment: 25 Constitutional Well-nourished and well-hydrated in no acute distress. Respiratory normal breathing without difficulty. [name], [name] ([id number]) [id number] Page 3 of 7 Psychiatric this patient is able to make decisions and demonstrates good insight into disease process. Alert and Oriented x 3. pleasant and cooperative. Notes Upon inspection patient's wound bed showed signs of good granulation epithelization at this point. There is nothing open and I think there is just a little dry skin she seems to have done extremely well however. Electronic Signature(s) Signed: 10/20/2023 9:03:57 AM By: Allen Derry PA-C Entered By: Allen Derry on 10/20/2023 06:03:56 -------------------------------------------------------------------------------- Physician Orders Details Patient Name: Date of Service: Christine Saxon J. 10/20/2023 8:00 A M Medical Record Number: [id number] Patient Account Number: [id number] Date of Birth/Sex: Treating RN: 06-18-1946 (77 y.o. [name] Primary [name] Provider: [name] Other Clinician: Referring Provider: Treating Provider/Extender: [name] in Treatment: 25 The following information was scribed by: Yevonne Pax The information was scribed for: Allen Derry Verbal / Phone Orders: No Diagnosis  Coding ICD-10 Coding Code Description I87.332 Chronic venous hypertension (idiopathic) with ulcer and inflammation of left lower extremity E11.622 Type 2 diabetes mellitus with other skin ulcer I89.0 Lymphedema, not elsewhere classified L97.822 Non-pressure chronic ulcer of other part of left lower leg with fat layer exposed I10 Essential (primary) hypertension Discharge From [facility] Services [name] Treatment Complete - apply AandD ointment and band aid for protection times 2 weeks then D/C Electronic Signature(s) Signed: 10/20/2023 8:38:05 AM By: [name] Signed: 10/20/2023 4:38:12 PM By: Allen Derry PA-C Entered By: Yevonne Pax on 10/20/2023 05:38:05 -------------------------------------------------------------------------------- Problem List Details Patient Name: Date of Service: [name], [name] J. 10/20/2023 8:00 A M Medical Record Number: [id number] Patient Account Number: [id number] AMARRIE, HULETTE ([id number]) [phone].pdf Page 4 of 7 Date of Birth/Sex: Treating RN: [birth year] (77 y.o. 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 10/20/2023 8:00 A M Medical Record Number: [id number] Patient Account Number:  [id number] Date of Birth/Sex: Treating RN: Sep 28, 1946 (77 y.o. [name] Primary [name] Provider: [name] Other Clinician: Referring Provider: Treating Provider/Extender: [name] in Treatment: 25 Subjective Chief Complaint Information obtained from Patient Left LE Ulcer History of Present Illness (HPI) 04-22-2023 upon evaluation patient appears to be doing somewhat poorly in regard to a wound on the left distal/lateral lower extremity and about the ankle region. She tells me this occurred on February 07, 2023 as a result of trying to get a very tight and new compression sock off. It was getting stuck and she somewhat twisted trying to get it off and pulled skin off. Since that time she has been having a hard time getting this to heal. Fortunately there does not appear to be any signs of active infection which is great news. She did have an ABI of 0.75 today. With that being said I am actually decently pleased with how the wound looks it does have some necrotic tissue but there is going to need to be some sharp debridement to clear away some of the dead tissue as well [name], [name] ([id number]) [id number] Page 5 of 7 today. Patient does have a history of diabetes mellitus type 2, lymphedema, hypertension, and chronic venous insufficiency. 05-02-2023 upon evaluation today patient appears to be doing well currently in regard to her wound. She is actually showing signs of improvement and very pleased in that regard and fortunately I do not see any signs of active infection locally or systemically at this time which is great news. No fevers, chills, nausea, vomiting, or diarrhea. 05-09-2023  upon evaluation today patient appears to be doing well currently in regard to her wound is not worse but is also not better. I discussed with her again today that we will get a need to likely compression wrap for her. With that being said she is adamantly opposed to this and wants to get 1 more week given something to try and I recommend to be seen by double layer Tubigrip to see if this can be beneficial. 05-17-2023 upon evaluation today patient appears to be doing well currently in regard to her wound although it still very inflamed on the posterior aspect of her ankle. I discussed that with her today. With that being said I do not see any evidence of general worsening which is great news but also do not see a significant amount of improvement compared to last week in my opinion. I discussed this with her at this point today. 05-23-2023 upon evaluation today patient appears to be doing well currently in regard to her wound. This is actually showing some slough and biofilm on the surface of the wound. The good news is I do believe that we cleaned some of the soften the scar ischial little bit of improvement with that being said the periwound is being very irritated I think the Vashe may have irritated her based on what she is telling me. Fortunately I do not see any evidence of active infection locally nor systemically at this time which is great news. No fevers, chills, nausea, vomiting, or diarrhea. 7/22; wound on the left lateral lower leg in the setting of significant chronic venous hypertension. We have been using Prisma. She would not allow a compression wrap although she is using a support stocking. 06-06-2023 upon evaluation today patient's wound actually showing signs of significant improvement with regard to her wound. She has been tolerating the dressing changes without complication. Fortunately there does  not appear to be any signs of active infection locally nor systemically at this time which  is great news. No fevers, chills, nausea, vomiting, or diarrhea. 06-20-2023 upon evaluation today patient appears to be doing well currently in regard to her wound which is actually showing signs of significant improvement. Fortunately I do not see any evidence of worsening infection I do believe that the patient is moving in the right direction which is great news and in general I think that we are on the right track here. 06-27-2023 upon evaluation today patient appears to be doing well currently in regard to her wound. She has been tolerating the dressing changes without complication and in general I do feel like there were making really good headway towards complete closure. I do not see any signs of active infection locally or systemically at this time. 07-14-2023 upon evaluation today patient appears to be doing well currently in regard to her wound. She has been tolerating the dressing changes without complication. Fortunately I do not see any signs of active infection at this time which is great news. No fevers, chills, nausea, vomiting, or diarrhea. 07-19-2023 upon evaluation today patient appears to be doing well currently in regard to her wound although is not significantly smaller. I do think making a change in shift in the dressing would be a good idea here. My suggestion is gena be that we go ahead and see about doing a Xeroform gauze dressing to see how this will do she is in agreement with that plan. 07-25-2023 upon evaluation today patient appears to be doing well currently in regard to her wound although she tells me the medicine burns pretty much all week. The visual appearance of the wound is significantly improved compared to what it was previous which is good news. 08-01-2023 upon evaluation today patient's wound actually appears to be showing signs of excellent improvement. Fortunately I do not see any signs of active infection locally or systemically which is great news I actually  feel like the wound is measuring smaller this looks much better. 08-15-2023 upon evaluation today patient appears to be doing well currently in regard to her wound. She has been tolerating the dressing changes she has been using Vaseline or Vaseline gauze and this seems to be doing quite well. Fortunately I do not see any evidence of worsening overall and I feel like the patient is making good headway towards complete closure. 09-06-2023 upon evaluation today patient's wound is actually showing signs of excellent improvement in fact I think she is very close to complete resolution. Fortunately I do not see any signs of worsening overall and I do believe that the patient is making headway towards closure. 09-20-2023 upon evaluation today patient appears to be doing well currently in regard to her wound which actually appears to be completely healed. Fortunately I do not see any signs of worsening overall I feel like that she is doing quite well and she has not had any drainage she tells me for a couple weeks. 10-20-2023 upon evaluation patient's wound actually appears to be completely healed. Fortunately I do not see any signs of infection at this time and in general I do believe that the patient is making excellent headway here towards closure which is great news. Objective Constitutional Well-nourished and well-hydrated in no acute distress. Vitals Time Taken: 8:08 AM, Height: 68 in, Weight: 200 lbs, BMI: 30.4, Temperature: 97.8 F, Pulse: 66 bpm, Respiratory Rate: 18 breaths/min, Blood Pressure: 136/57 mmHg. Respiratory normal  breathing without difficulty. Psychiatric this patient is able to make decisions and demonstrates good insight into disease process. Alert and Oriented x 3. pleasant and cooperative. General Notes: Upon inspection patient's wound bed showed signs of good granulation epithelization at this point. There is nothing open and I think there is just a little dry skin she seems to  have done extremely well however. [name], [name] ([id number]) [id number] Page 6 of 7 Assessment Active Problems ICD-10 Chronic venous hypertension (idiopathic) with ulcer and inflammation of left lower extremity Type 2 diabetes mellitus with other skin ulcer Lymphedema, not elsewhere classified Non-pressure chronic ulcer of other part of left lower leg with fat layer exposed Essential (primary) hypertension Plan Discharge From [facility] Services: [facility] from [name] Treatment Complete - apply AandD ointment and band aid for protection times 2 weeks then D/C 1. I am going to recommend the patient should continue to monitor for any signs of infection or worsening to see if anything open she should contact the office let me know. 2. Also can recommend that she should continue to elevate her legs much as possible to help with edema control that still could be of utmost importance in my opinion. Otherwise we will see how things stand in the future as needed. Will see her back for follow-up visit as needed. Electronic Signature(s) Signed: 10/20/2023 9:04:16 AM By: Allen Derry PA-C Entered By: Allen Derry on 10/20/2023 06:04:16 -------------------------------------------------------------------------------- SuperBill Details Patient Name: Date of Service: [name], [name] 10/20/2023 Medical Record Number: [id number] Patient Account Number: [id number] Date of Birth/Sex: Treating RN: 26-Sep-1946 (77 y.o. [name] Primary [name] Provider: [name] Other Clinician: Referring Provider: Treating Provider/Extender: [name] in Treatment: 25 Diagnosis Coding ICD-10 Codes Code Description [phone] Chronic venous hypertension (idiopathic) with ulcer and inflammation of left lower extremity E11.622 Type 2 diabetes mellitus with other skin ulcer I89.0 Lymphedema, not elsewhere classified L97.822 Non-pressure chronic  ulcer of other part of left lower leg with fat layer exposed I10 Essential (primary) hypertension Facility Procedures : CPT4 Code: [id number] Description: 24401 - [name] VISIT-LEV 2 EST PT Modifier: Quantity: 1 Physician Procedures REGNIA, SAVASTANO ([id number]): CPT4 Code Description [id number] 99213 - WC PHYS LEVEL 3 - EST PT ICD-10 Diagnosis Description I87.332 Chronic venous hypertension (idiopathic) with ulcer and inflammati E11.622 Type 2 diabetes mellitus with other skin ulcer  I89.0 Lymphedema, not elsewhere classified L97.822 Non-pressure chronic ulcer of other part of left lower leg with fa [id number] Page 7 of 7: Quantity Modifier 1 on of left lower extremity t layer exposed Electronic Signature(s) Signed: 10/20/2023 9:05:41 AM By: Allen Derry PA-C Previous Signature: 10/20/2023 8:38:39 AM Version By: [name] Entered By: Allen Derry on 10/20/2023 06:05:41

## 2024-01-09 ENCOUNTER — Other Ambulatory Visit: Payer: Self-pay | Admitting: Infectious Diseases

## 2024-01-09 DIAGNOSIS — Z1231 Encounter for screening mammogram for malignant neoplasm of breast: Secondary | ICD-10-CM

## 2024-02-06 ENCOUNTER — Other Ambulatory Visit (INDEPENDENT_AMBULATORY_CARE_PROVIDER_SITE_OTHER): Payer: Self-pay | Admitting: Nurse Practitioner

## 2024-02-06 DIAGNOSIS — L97329 Non-pressure chronic ulcer of left ankle with unspecified severity: Secondary | ICD-10-CM

## 2024-02-06 DIAGNOSIS — I83013 Varicose veins of right lower extremity with ulcer of ankle: Secondary | ICD-10-CM

## 2024-02-06 DIAGNOSIS — I890 Lymphedema, not elsewhere classified: Secondary | ICD-10-CM

## 2024-02-06 DIAGNOSIS — I83023 Varicose veins of left lower extremity with ulcer of ankle: Secondary | ICD-10-CM

## 2024-02-06 DIAGNOSIS — L97319 Non-pressure chronic ulcer of right ankle with unspecified severity: Secondary | ICD-10-CM

## 2024-02-08 ENCOUNTER — Encounter (INDEPENDENT_AMBULATORY_CARE_PROVIDER_SITE_OTHER): Payer: Medicare Other

## 2024-02-08 ENCOUNTER — Encounter (INDEPENDENT_AMBULATORY_CARE_PROVIDER_SITE_OTHER): Payer: Self-pay | Admitting: Nurse Practitioner

## 2024-02-08 ENCOUNTER — Ambulatory Visit (INDEPENDENT_AMBULATORY_CARE_PROVIDER_SITE_OTHER): Payer: Medicare Other

## 2024-02-08 ENCOUNTER — Ambulatory Visit (INDEPENDENT_AMBULATORY_CARE_PROVIDER_SITE_OTHER): Payer: Medicare Other | Admitting: Nurse Practitioner

## 2024-02-08 VITALS — BP 151/62 | HR 62 | Resp 16 | Wt 207.6 lb

## 2024-02-08 DIAGNOSIS — I83023 Varicose veins of left lower extremity with ulcer of ankle: Secondary | ICD-10-CM

## 2024-02-08 DIAGNOSIS — I83013 Varicose veins of right lower extremity with ulcer of ankle: Secondary | ICD-10-CM

## 2024-02-08 DIAGNOSIS — I83003 Varicose veins of unspecified lower extremity with ulcer of ankle: Secondary | ICD-10-CM

## 2024-02-08 DIAGNOSIS — L97302 Non-pressure chronic ulcer of unspecified ankle with fat layer exposed: Secondary | ICD-10-CM

## 2024-02-08 DIAGNOSIS — L97319 Non-pressure chronic ulcer of right ankle with unspecified severity: Secondary | ICD-10-CM

## 2024-02-08 DIAGNOSIS — L97329 Non-pressure chronic ulcer of left ankle with unspecified severity: Secondary | ICD-10-CM

## 2024-02-08 DIAGNOSIS — I890 Lymphedema, not elsewhere classified: Secondary | ICD-10-CM

## 2024-02-08 DIAGNOSIS — I10 Essential (primary) hypertension: Secondary | ICD-10-CM

## 2024-02-08 NOTE — Progress Notes (Signed)
 Subjective:    Patient ID: [name], female    DOB: [birth year], 78 y.o.   MRN: [id number] Chief Complaint  Patient presents with   Follow-up    Ref Sampson Goon consult ble vv with ulcers, lymphedema    The patient is a 78 year old female who presents today as a referral from Dr. Sampson Goon regarding an open right venous ulcer.  The patient previously had an ulceration on her left which took several weeks to heal and required some significant help from wound care.  Those wounds have healed but she has stumbled and developed a small wound on her right ankle area.  Additionally she has previously had reconstruction in that area due to having a tumor removed many years ago.  She typically has lower extremity edema and has been very diligent with wearing medical grade compression socks but she has not been able to since getting the wound because it has been so painful for her.  She has been treating the area with a topical ointment and bandage.  She also elevates her lower extremities is much as possible.  Today noninvasive study showed no evidence of DVT or superficial phlebitis bilaterally.  She does have significant venous reflux in the bilateral saphenous veins as well as in the right small saphenous vein.  This is consistent with her previous studies in 2021.    Review of Systems  Cardiovascular:  Positive for leg swelling.  Skin:  Positive for wound.  All other systems reviewed and are negative.      Objective:   Physical Exam Vitals reviewed.  HENT:     Head: Normocephalic.  Cardiovascular:     Rate and Rhythm: Normal rate.  Pulmonary:     Effort: Pulmonary effort is normal.  Musculoskeletal:     Right lower leg: Edema present.  Skin:    General: Skin is warm and dry.  Neurological:     Mental Status: She is alert and oriented to person, place, and time.  Psychiatric:        Mood and Affect: Mood normal.        Behavior: Behavior normal.        Thought Content:  Thought content normal.        Judgment: Judgment normal.     BP (!) 151/62   Pulse 62   Resp 16   Wt 207 lb 9.6 oz (94.2 kg)   BMI 34.55 kg/m   Past Medical History:  Diagnosis Date   Arthritis    hands, back   Bilateral sciatica    Cancer (HCC)    Diabetes mellitus without complication (HCC)    Diet controlled   Dysrhythmia    approx 15 yrs had to have heart "stopped and restarted" due to arrhythmia from misaligned discs "pressing on nerves".   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  Both had MRSA. resolved   Multilevel degenerative disc disease    Phlebitis    bilateral legs   Seasonal allergies    Shingles 2018   Right eye   Thyroid disease    Wears dentures    partial lower    Social History   Socioeconomic History   Marital status: Married    Spouse name: Not on file   Number of children: Not on file   Years of education: Not on file   Highest education level:  Not on file  Occupational History   Not on file  Tobacco Use   Smoking status: Former    Current packs/day: 0.00    Average packs/day: 0.5 packs/day for 20.0 years (10.0 ttl pk-yrs)    Types: Cigarettes    Start date: 11/25/1974    Quit date: 11/25/1994    Years since quitting: 29.2   Smokeless tobacco: Never  Vaping Use   Vaping status: Never Used  Substance and Sexual Activity   Alcohol use: Not Currently   Drug use: Not on file   Sexual activity: Yes  Other Topics Concern   Not on file  Social History Narrative   Not on file   Social Drivers of Health   Financial Resource Strain: Low Risk  (01/09/2024)   Received from [facility]   Overall Financial Resource Strain (CARDIA)    Difficulty of Paying Living Expenses: Not hard at all  Food Insecurity: No Food Insecurity (01/09/2024)   Received from [facility]   Hunger Vital Sign     Worried About Running Out of Food in the Last Year: Never true    Ran Out of Food in the Last Year: Never true  Transportation Needs: No Transportation Needs (01/09/2024)   Received from [facility] - Transportation    In the past 12 months, has lack of transportation kept you from medical appointments or from getting medications?: No    Lack of Transportation (Non-Medical): No  Physical Activity: Not on file  Stress: Not on file  Social Connections: Not on file  Intimate Partner Violence: Not on file    Past Surgical History:  Procedure Laterality Date   ABDOMINAL HYSTERECTOMY     APPENDECTOMY     CATARACT EXTRACTION W/PHACO Right 02/13/2020   Procedure: CATARACT EXTRACTION PHACO AND INTRAOCULAR LENS PLACEMENT (IOC) RIGHT 18.82  02:15.0  13.9%;  Surgeon: Lockie Mola, MD;  Location: [facility];  Service: Ophthalmology;  Laterality: Right;  Diabetic - diet controlled   GRAFT APPLICATION     KNEE ARTHROSCOPY     TONSILLECTOMY     TUMOR REMOVAL      Family History  Problem Relation Age of Onset   Varicose Veins Mother    Heart disease Mother    Heart disease Father    Diabetes Father    Heart disease Brother    Diabetes Brother    Cancer Daughter     Allergies  Allergen Reactions   Amoxicillin-Pot Clavulanate Nausea And Vomiting   Codeine Nausea And Vomiting   Metformin And Related Diarrhea       Latest Ref Rng & Units 11/13/2021    3:12 PM 11/02/2021    5:41 AM 11/01/2021    5:00 AM  CBC  WBC 4.0 - 10.5 K/uL 10.1  11.0  10.3   Hemoglobin 12.0 - 15.0 g/dL 40.9  81.1  91.4   Hematocrit 36.0 - 46.0 % 36.8  33.1  35.5   Platelets 150 - 400 K/uL 208  205  187       CMP     Component Value Date/Time   NA 136 11/13/2021 1512   K 4.2 11/13/2021 1512   CL 97 (L) 11/13/2021 1512   CO2 33 (H) 11/13/2021 1512   GLUCOSE 152 (H) 11/13/2021 1512   BUN 15 11/13/2021 1512   CREATININE 0.84 11/13/2021 1512   CALCIUM 8.7 (L)  11/13/2021 1512   PROT 7.6 10/31/2021 1525  ALBUMIN 3.6 10/31/2021 1525   AST 19 10/31/2021 1525   ALT 20 10/31/2021 1525   ALKPHOS 75 10/31/2021 1525   BILITOT 0.8 10/31/2021 1525   GFRNONAA >60 11/13/2021 1512     No results found.     Assessment & Plan:   1. 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 Patient will continue  wearing graduated compression stockings class 1 on a daily basis, beginning first thing in the morning and removing them in the evening.  The patient is CEAP [id number].  The patient has been wearing compression for more than 12 weeks with no or little benefit.  The patient has been exercising daily for more than 12 weeks. The patient has been elevating and taking OTC pain medications for more than 12 weeks.  None of these have have eliminated the pain related to the varicose veins and venous reflux or the discomfort regarding venous congestion.    In addition, behavioral modification including elevation during the day was again discussed and this will continue.  The patient has utilized over the counter pain medications and has been exercising.  However, at this time conservative therapy has not alleviated the patient's symptoms of leg pain and swelling  Recommend: laser ablation of the right and  left great saphenous veins to eliminate the symptoms of pain and swelling of the lower extremities caused by the severe superficial venous reflux disease.   2. Primary hypertension Continue antihypertensive medications as already ordered, these medications have been reviewed and there are no changes at this time.   3. Venous ulcer of ankle, right (HCC) I had a long discussion with the patient regarding compromised wound healing in the setting of a venous ulcer.  Currently  the area is very tender for her and so she is apprehensive to do Unna boots.  However I have explained to her that without compression and consistent compression it probably will have difficulty with healing.  I also think it will need more than just topical treatments for wound healing.  I suggested that we utilize an Ace bandage to see if she can tolerate this.  Again Unna boots were offered but she declined due to concern for the pain.  I have also recommended that she reach out to wound care as they may have some options that are going to be more tolerable for her.  In addition her undergoing endovenous laser ablation as noted above will also be necessary in her healing.   Current Outpatient Medications on File Prior to Visit  Medication Sig Dispense Refill   albuterol (VENTOLIN HFA) 108 (90 Base) MCG/ACT inhaler Inhale 2 puffs into the lungs every 6 (six) hours as needed for wheezing.     allopurinol (ZYLOPRIM) 300 MG tablet Take 300 mg by mouth daily.     amLODipine (NORVASC) 5 MG tablet Take 5 mg by mouth daily.     ascorbic acid (VITAMIN C) 500 MG tablet Take 500 mg by mouth daily as needed.     bisoprolol (ZEBETA) 10 MG tablet Take 10 mg by mouth daily.     celecoxib (CELEBREX) 200 MG capsule Take 200 mg by mouth daily.     cholecalciferol (VITAMIN D3) 25 MCG (1000 UNIT) tablet Take 1,000 Units by mouth daily.     colchicine 0.6 MG tablet Take 0.6  mg by mouth as directed.     cyclobenzaprine (FLEXERIL) 10 MG tablet Take 10 mg by mouth 3 (three) times daily as needed for muscle spasms.     Difluprednate (DUREZOL) 0.05 % EMUL Place 1 drop into the right eye 2 (two) times a week.     famotidine (PEPCID) 10 MG tablet Take 10 mg by mouth in the morning and at bedtime.     fenofibrate micronized (LOFIBRA) 67 MG capsule Take 67 mg by mouth daily before breakfast.     fluocinonide (LIDEX) 0.05 % external solution Apply 1 application topically See admin instructions. Use 1 - 2 times daily at scalp.  Do not use at face 60 mL 2   fluticasone (FLONASE) 50 MCG/ACT nasal spray Place 2 sprays into both nostrils daily.     furosemide (LASIX) 40 MG tablet Take 20-40 mg by mouth daily. 20 mg alternate with 40 mg every other day     gabapentin (NEURONTIN) 300 MG capsule Take 300-600 mg by mouth 3 (three) times daily.     glimepiride (AMARYL) 1 MG tablet Take 1 mg by mouth daily.     hydrALAZINE (APRESOLINE) 50 MG tablet Take 50 mg by mouth 3 (three) times daily.     ketoconazole (NIZORAL) 2 % shampoo 2-3 times a week lather on scalp, leave on 5-10 minutes, rinse well 120 mL 5   levothyroxine (SYNTHROID) 75 MCG tablet TAKE 1 TABLET(75 MCG) BY MOUTH EVERY DAY 30 TO 60 MINUTES BEFORE BREAKFAST ON AN EMPTY STOMACH AND WITH A GLASS OF WATER     loperamide (IMODIUM A-D) 2 MG tablet Take 2 mg by mouth 3 (three) times daily as needed for diarrhea or loose stools.     losartan (COZAAR) 100 MG tablet Take 100 mg by mouth daily.     mupirocin ointment (BACTROBAN) 2 % Apply 1 Application topically in the morning, at noon, and at bedtime.     PREVIDENT 5000 DRY MOUTH 1.1 % GEL dental gel Place onto teeth.     rosuvastatin (CRESTOR) 10 MG tablet Take 10 mg by mouth at bedtime.     triamcinolone lotion (KENALOG) 0.1 % Apply 1 Application topically 3 (three) times daily.     trolamine salicylate (ASPERCREME) 10 % cream Apply 1 Application topically as needed for muscle pain.     valACYclovir (VALTREX) 500 MG tablet Take 500 mg by mouth daily as needed.     No current facility-administered medications on file prior to visit.    There are no Patient Instructions on file for this visit. No follow-ups on file.   Georgiana Spinner, NP

## 2024-03-14 ENCOUNTER — Encounter: Attending: Physician Assistant | Admitting: Physician Assistant

## 2024-03-14 DIAGNOSIS — E11622 Type 2 diabetes mellitus with other skin ulcer: Secondary | ICD-10-CM | POA: Insufficient documentation

## 2024-03-14 DIAGNOSIS — L97812 Non-pressure chronic ulcer of other part of right lower leg with fat layer exposed: Secondary | ICD-10-CM | POA: Insufficient documentation

## 2024-03-14 DIAGNOSIS — I10 Essential (primary) hypertension: Secondary | ICD-10-CM | POA: Insufficient documentation

## 2024-03-14 DIAGNOSIS — I87331 Chronic venous hypertension (idiopathic) with ulcer and inflammation of right lower extremity: Secondary | ICD-10-CM | POA: Diagnosis present

## 2024-03-14 DIAGNOSIS — I890 Lymphedema, not elsewhere classified: Secondary | ICD-10-CM | POA: Insufficient documentation

## 2024-03-21 ENCOUNTER — Encounter: Admitting: Physician Assistant

## 2024-03-21 DIAGNOSIS — I87331 Chronic venous hypertension (idiopathic) with ulcer and inflammation of right lower extremity: Secondary | ICD-10-CM | POA: Diagnosis not present

## 2024-03-26 ENCOUNTER — Ambulatory Visit: Admitting: Physician Assistant

## 2024-03-26 ENCOUNTER — Telehealth (INDEPENDENT_AMBULATORY_CARE_PROVIDER_SITE_OTHER): Payer: Self-pay | Admitting: Vascular Surgery

## 2024-03-26 NOTE — Telephone Encounter (Signed)
 Spoke with patient to try and schedule bilateral laser ablation per Dr. Prescilla Brod. Patient states that she has an infection and does not want to do the ablations right now. States she will call us  back to schedule.   right leg GSV laser. see GS. no prior auth req.   1 week post right leg GSV laser   left leg GSV laser. see GS. no prior auth req.   1 week post left leg GSV laser   4 and 8 week post bilateral GSV laser. see gs/fb

## 2024-03-27 ENCOUNTER — Encounter (INDEPENDENT_AMBULATORY_CARE_PROVIDER_SITE_OTHER): Payer: Self-pay

## 2024-03-28 ENCOUNTER — Encounter: Admitting: Physician Assistant

## 2024-03-28 DIAGNOSIS — I87331 Chronic venous hypertension (idiopathic) with ulcer and inflammation of right lower extremity: Secondary | ICD-10-CM | POA: Diagnosis not present

## 2024-04-04 ENCOUNTER — Encounter: Admitting: Physician Assistant

## 2024-04-04 DIAGNOSIS — I87331 Chronic venous hypertension (idiopathic) with ulcer and inflammation of right lower extremity: Secondary | ICD-10-CM | POA: Diagnosis not present

## 2024-04-11 ENCOUNTER — Encounter: Attending: Physician Assistant | Admitting: Physician Assistant

## 2024-04-11 DIAGNOSIS — I87331 Chronic venous hypertension (idiopathic) with ulcer and inflammation of right lower extremity: Secondary | ICD-10-CM | POA: Diagnosis not present

## 2024-04-11 DIAGNOSIS — I890 Lymphedema, not elsewhere classified: Secondary | ICD-10-CM | POA: Diagnosis not present

## 2024-04-11 DIAGNOSIS — I10 Essential (primary) hypertension: Secondary | ICD-10-CM | POA: Diagnosis not present

## 2024-04-11 DIAGNOSIS — L97812 Non-pressure chronic ulcer of other part of right lower leg with fat layer exposed: Secondary | ICD-10-CM | POA: Insufficient documentation

## 2024-04-11 DIAGNOSIS — E11622 Type 2 diabetes mellitus with other skin ulcer: Secondary | ICD-10-CM | POA: Insufficient documentation

## 2024-04-19 ENCOUNTER — Encounter: Admitting: Physician Assistant

## 2024-04-19 DIAGNOSIS — E11622 Type 2 diabetes mellitus with other skin ulcer: Secondary | ICD-10-CM | POA: Diagnosis not present

## 2024-05-02 ENCOUNTER — Encounter: Admitting: Physician Assistant

## 2024-05-02 DIAGNOSIS — E11622 Type 2 diabetes mellitus with other skin ulcer: Secondary | ICD-10-CM | POA: Diagnosis not present

## 2024-05-16 ENCOUNTER — Ambulatory Visit: Admitting: Physician Assistant

## 2024-05-23 ENCOUNTER — Encounter: Attending: Physician Assistant | Admitting: Physician Assistant

## 2024-05-23 DIAGNOSIS — I890 Lymphedema, not elsewhere classified: Secondary | ICD-10-CM | POA: Insufficient documentation

## 2024-05-23 DIAGNOSIS — I10 Essential (primary) hypertension: Secondary | ICD-10-CM | POA: Diagnosis not present

## 2024-05-23 DIAGNOSIS — I87331 Chronic venous hypertension (idiopathic) with ulcer and inflammation of right lower extremity: Secondary | ICD-10-CM | POA: Diagnosis present

## 2024-05-23 DIAGNOSIS — E11622 Type 2 diabetes mellitus with other skin ulcer: Secondary | ICD-10-CM | POA: Diagnosis not present

## 2024-05-23 DIAGNOSIS — L97812 Non-pressure chronic ulcer of other part of right lower leg with fat layer exposed: Secondary | ICD-10-CM | POA: Insufficient documentation

## 2024-06-27 ENCOUNTER — Other Ambulatory Visit: Payer: Self-pay

## 2024-06-27 ENCOUNTER — Ambulatory Visit
Admission: RE | Admit: 2024-06-27 | Discharge: 2024-06-27 | Disposition: A | Discharge status: Home / Self Care | Source: Ambulatory Visit

## 2024-06-27 DIAGNOSIS — M7989 Other specified soft tissue disorders: Secondary | ICD-10-CM | POA: Insufficient documentation

## 2024-07-17 ENCOUNTER — Other Ambulatory Visit (INDEPENDENT_AMBULATORY_CARE_PROVIDER_SITE_OTHER): Payer: Self-pay | Admitting: Vascular Surgery

## 2024-07-17 DIAGNOSIS — I83003 Varicose veins of unspecified lower extremity with ulcer of ankle: Secondary | ICD-10-CM

## 2024-07-20 ENCOUNTER — Other Ambulatory Visit (INDEPENDENT_AMBULATORY_CARE_PROVIDER_SITE_OTHER)

## 2024-07-20 DIAGNOSIS — I83003 Varicose veins of unspecified lower extremity with ulcer of ankle: Secondary | ICD-10-CM

## 2024-07-20 DIAGNOSIS — L97302 Non-pressure chronic ulcer of unspecified ankle with fat layer exposed: Secondary | ICD-10-CM

## 2024-07-24 ENCOUNTER — Encounter (INDEPENDENT_AMBULATORY_CARE_PROVIDER_SITE_OTHER): Payer: Self-pay | Admitting: Vascular Surgery

## 2024-07-24 ENCOUNTER — Ambulatory Visit (INDEPENDENT_AMBULATORY_CARE_PROVIDER_SITE_OTHER): Admitting: Vascular Surgery

## 2024-07-24 VITALS — BP 148/74 | HR 62 | Wt 201.4 lb

## 2024-07-24 DIAGNOSIS — E782 Mixed hyperlipidemia: Secondary | ICD-10-CM

## 2024-07-24 DIAGNOSIS — E119 Type 2 diabetes mellitus without complications: Secondary | ICD-10-CM | POA: Diagnosis not present

## 2024-07-24 DIAGNOSIS — I872 Venous insufficiency (chronic) (peripheral): Secondary | ICD-10-CM

## 2024-07-24 DIAGNOSIS — I10 Essential (primary) hypertension: Secondary | ICD-10-CM | POA: Diagnosis not present

## 2024-07-24 DIAGNOSIS — M7122 Synovial cyst of popliteal space [Baker], left knee: Secondary | ICD-10-CM

## 2024-07-24 DIAGNOSIS — E039 Hypothyroidism, unspecified: Secondary | ICD-10-CM

## 2024-07-27 ENCOUNTER — Ambulatory Visit (INDEPENDENT_AMBULATORY_CARE_PROVIDER_SITE_OTHER): Admitting: Vascular Surgery

## 2024-08-05 ENCOUNTER — Encounter (INDEPENDENT_AMBULATORY_CARE_PROVIDER_SITE_OTHER): Payer: Self-pay | Admitting: Vascular Surgery

## 2024-08-05 NOTE — Progress Notes (Signed)
 Subjective:    Patient ID: [name], female    DOB: 12-Jan-1946, 78 y.o.   MRN: [id number] Chief Complaint  Patient presents with   Follow-up    Pain in left leg    [name] is a 78 yo female who presents to clinic today in follow-up for bilateral lower extremity swelling.  She endorses that on her left leg she had an insect bite which created an area of edema with +3 swelling.  She was told to use cortisone over-the-counter and was given an antibiotic.  She also used some lidocaine  spray for any pain or discomfort and that seemed to have helped.  Today on exam all of this seems to have resolved.  She did undergo venous duplex ultrasounds which showed reflux bilaterally.  It also revealed that she has a Baker's cyst to her left lower extremity.    Review of Systems  Constitutional: Negative.   Cardiovascular:  Positive for leg swelling.  All other systems reviewed and are negative.      Objective:   Physical Exam Vitals reviewed.  Constitutional:      Appearance: She is obese.  HENT:     Head: Normocephalic.  Eyes:     Pupils: Pupils are equal, round, and reactive to light.  Cardiovascular:     Rate and Rhythm: Normal rate and regular rhythm.     Pulses: Normal pulses.     Heart sounds: Normal heart sounds.  Pulmonary:     Effort: Pulmonary effort is normal.     Breath sounds: Normal breath sounds.  Abdominal:     General: Bowel sounds are normal.     Palpations: Abdomen is soft.  Musculoskeletal:        General: Normal range of motion.  Skin:    General: Skin is warm and dry.     Capillary Refill: Capillary refill takes 2 to 3 seconds.  Neurological:     General: No focal deficit present.     Mental Status: She is alert and oriented to person, place, and time. Mental status is at baseline.  Psychiatric:        Mood and Affect: Mood normal.        Behavior: Behavior normal.        Thought Content: Thought content normal.        Judgment: Judgment  normal.     BP (!) 148/74 (BP Location: Left Arm, Patient Position: Sitting, Cuff Size: Large)   Pulse 62   Wt 201 lb 6.4 oz (91.4 kg)   BMI 33.51 kg/m   Past Medical History:  Diagnosis Date   Arthritis    hands, back   Bilateral sciatica    Cancer (HCC)    Diabetes mellitus without complication (HCC)    Diet controlled   Dysrhythmia    approx 15 yrs had to have heart stopped and restarted due to arrhythmia from misaligned discs pressing on nerves.   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  Both had MRSA. resolved   Multilevel degenerative disc disease    Phlebitis    bilateral legs   Seasonal allergies    Shingles 2018   Right eye   Thyroid disease    Wears dentures    partial lower    Social History   Socioeconomic History   Marital status: Married    Spouse name:  Not on file   Number of children: Not on file   Years of education: Not on file   Highest education level: Not on file  Occupational History   Not on file  Tobacco Use   Smoking status: Former    Current packs/day: 0.00    Average packs/day: 0.5 packs/day for 20.0 years (10.0 ttl pk-yrs)    Types: Cigarettes    Start date: 11/25/1974    Quit date: 11/25/1994    Years since quitting: 29.7   Smokeless tobacco: Never  Vaping Use   Vaping status: Never Used  Substance and Sexual Activity   Alcohol use: Not Currently   Drug use: Not on file   Sexual activity: Yes  Other Topics Concern   Not on file  Social History Narrative   Not on file   Social Drivers of Health   Financial Resource Strain: Low Risk  (08/02/2024)   Received from [facility]   Overall Financial Resource Strain (CARDIA)    Difficulty of Paying Living Expenses: Not hard at all  Food Insecurity: No Food Insecurity (08/02/2024)   Received from [facility]   Hunger Vital Sign     Within the past 12 months, you worried that your food would run out before you got the money to buy more.: Never true    Within the past 12 months, the food you bought just didn't last and you didn't have money to get more.: Never true  Transportation Needs: No Transportation Needs (08/02/2024)   Received from [facility]    In the past 12 months, has lack of transportation kept you from medical appointments or from getting medications?: No    Lack of Transportation (Non-Medical): No  Physical Activity: Not on file  Stress: Not on file  Social Connections: Not on file  Intimate Partner Violence: Not on file    Past Surgical History:  Procedure Laterality Date   ABDOMINAL HYSTERECTOMY     APPENDECTOMY     CATARACT EXTRACTION W/PHACO Right 02/13/2020   Procedure: CATARACT EXTRACTION PHACO AND INTRAOCULAR LENS PLACEMENT (IOC) RIGHT 18.82  02:15.0  13.9%;  Surgeon: Mittie Gaskin, MD;  Location: [facility];  Service: Ophthalmology;  Laterality: Right;  Diabetic - diet controlled   GRAFT APPLICATION     KNEE ARTHROSCOPY     TONSILLECTOMY     TUMOR REMOVAL      Family History  Problem Relation Age of Onset   Varicose Veins Mother    Heart disease Mother    Heart disease Father    Diabetes Father    Heart disease Brother    Diabetes Brother    Cancer Daughter     Allergies  Allergen Reactions   Amoxicillin-Pot Clavulanate Nausea And Vomiting   Codeine Nausea And Vomiting   Metformin And Related Diarrhea       Latest Ref Rng & Units 11/13/2021    3:12 PM 11/02/2021    5:41 AM 11/01/2021    5:00 AM  CBC  WBC 4.0 - 10.5 K/uL 10.1  11.0  10.3   Hemoglobin 12.0 - 15.0 g/dL 88.2  89.2  88.7   Hematocrit 36.0 - 46.0 % 36.8  33.1  35.5   Platelets 150 - 400 K/uL 208  205  187       CMP     Component Value Date/Time   NA 136 11/13/2021 1512   K 4.2 11/13/2021 1512  CL 97 (L) 11/13/2021 1512   CO2 33 (H)  11/13/2021 1512   GLUCOSE 152 (H) 11/13/2021 1512   BUN 15 11/13/2021 1512   CREATININE 0.84 11/13/2021 1512   CALCIUM  8.7 (L) 11/13/2021 1512   PROT 7.6 10/31/2021 1525   ALBUMIN 3.6 10/31/2021 1525   AST 19 10/31/2021 1525   ALT 20 10/31/2021 1525   ALKPHOS 75 10/31/2021 1525   BILITOT 0.8 10/31/2021 1525   GFRNONAA >60 11/13/2021 1512     No results found.     Assessment & Plan:   1. Chronic venous insufficiency (Primary) Patient presents to clinic today for left lower extremity swelling with +3 edema.  Since being seen by her PCP and using cortisone over-the-counter as well as given an antibiotic for some cellulitis and lidocaine  spray for pain this is all resolved today.  However she did undergo left lower extremity venous duplex ultrasounds which shows negative for DVT but reflux.  Today she has no symptoms in her left lower extremity therefore I do not recommend any other plan at this point in time.  She wishes to follow-up in 6 months if she continues to have lower extremity swelling.  I encouraged her to continue to use compression and elevate her legs when she does have swelling.  He was also noted to have a Baker's cyst to her left lower extremity.  Ambulatory referral to orthopedics will be placed.  2. Primary hypertension Continue antihypertensive medications as already ordered, these medications have been reviewed and there are no changes at this time.  3. Type 2 diabetes mellitus without complication, without long-term current use of insulin  (HCC) Continue hypoglycemic medications as already ordered, these medications have been reviewed and there are no changes at this time.  Hgb A1C to be monitored as already arranged by primary service  4. Mixed hyperlipidemia Continue statin as ordered and reviewed, no changes at this time  5. Hypothyroidism, unspecified type Continue hypothyroid medications as already ordered, these medications have been reviewed and there  are no changes at this time.   Current Outpatient Medications on File Prior to Visit  Medication Sig Dispense Refill   albuterol  (VENTOLIN  HFA) 108 (90 Base) MCG/ACT inhaler Inhale 2 puffs into the lungs every 6 (six) hours as needed for wheezing.     allopurinol  (ZYLOPRIM ) 300 MG tablet Take 300 mg by mouth daily.     amLODipine  (NORVASC ) 5 MG tablet Take 5 mg by mouth daily.     ascorbic acid (VITAMIN C) 500 MG tablet Take 500 mg by mouth daily as needed.     bisoprolol (ZEBETA) 10 MG tablet Take 10 mg by mouth daily.     celecoxib  (CELEBREX ) 200 MG capsule Take 200 mg by mouth daily.     cholecalciferol (VITAMIN D3) 25 MCG (1000 UNIT) tablet Take 1,000 Units by mouth daily.     colchicine 0.6 MG tablet Take 0.6 mg by mouth as directed.     cyclobenzaprine (FLEXERIL) 10 MG tablet Take 10 mg by mouth 3 (three) times daily as needed for muscle spasms.     Difluprednate (DUREZOL) 0.05 % EMUL Place 1 drop into the right eye 2 (two) times a week.     famotidine (PEPCID) 10 MG tablet Take 10 mg by mouth in the morning and at bedtime.     fenofibrate micronized (LOFIBRA) 67 MG capsule Take 67 mg by mouth daily before breakfast.     fluocinonide  (LIDEX ) 0.05 % external solution Apply 1 application topically  See admin instructions. Use 1 - 2 times daily at scalp. Do not use at face 60 mL 2   fluticasone (FLONASE) 50 MCG/ACT nasal spray Place 2 sprays into both nostrils daily.     furosemide  (LASIX ) 40 MG tablet Take 20-40 mg by mouth daily. 20 mg alternate with 40 mg every other day     gabapentin  (NEURONTIN ) 300 MG capsule Take 300-600 mg by mouth 3 (three) times daily.     glimepiride (AMARYL) 1 MG tablet Take 1 mg by mouth daily.     hydrALAZINE  (APRESOLINE ) 50 MG tablet Take 50 mg by mouth 3 (three) times daily.     ketoconazole  (NIZORAL ) 2 % shampoo 2-3 times a week lather on scalp, leave on 5-10 minutes, rinse well 120 mL 5   levothyroxine  (SYNTHROID ) 75 MCG tablet TAKE 1 TABLET(75 MCG) BY  MOUTH EVERY DAY 30 TO 60 MINUTES BEFORE BREAKFAST ON AN EMPTY STOMACH AND WITH A GLASS OF WATER     loperamide (IMODIUM A-D) 2 MG tablet Take 2 mg by mouth 3 (three) times daily as needed for diarrhea or loose stools.     losartan  (COZAAR ) 100 MG tablet Take 100 mg by mouth daily.     PREVIDENT 5000 DRY MOUTH 1.1 % GEL dental gel Place onto teeth.     rosuvastatin  (CRESTOR ) 10 MG tablet Take 10 mg by mouth at bedtime.     triamcinolone lotion (KENALOG) 0.1 % Apply 1 Application topically 3 (three) times daily.     trolamine salicylate (ASPERCREME) 10 % cream Apply 1 Application topically as needed for muscle pain.     valACYclovir (VALTREX) 500 MG tablet Take 500 mg by mouth daily as needed.     mupirocin ointment (BACTROBAN) 2 % Apply 1 Application topically in the morning, at noon, and at bedtime. (Patient not taking: Reported on 07/24/2024)     No current facility-administered medications on file prior to visit.    There are no Patient Instructions on file for this visit. No follow-ups on file.   Gwendlyn JONELLE Shank, NP

## 2024-09-05 ENCOUNTER — Encounter

## 2024-11-13 ENCOUNTER — Encounter: Attending: Physician Assistant | Admitting: Physician Assistant

## 2024-11-13 DIAGNOSIS — I890 Lymphedema, not elsewhere classified: Secondary | ICD-10-CM | POA: Insufficient documentation

## 2024-11-13 DIAGNOSIS — I10 Essential (primary) hypertension: Secondary | ICD-10-CM | POA: Insufficient documentation

## 2024-11-13 DIAGNOSIS — I87323 Chronic venous hypertension (idiopathic) with inflammation of bilateral lower extremity: Secondary | ICD-10-CM | POA: Diagnosis not present

## 2024-11-20 ENCOUNTER — Encounter: Admitting: Physician Assistant

## 2024-11-27 ENCOUNTER — Encounter: Admitting: Physician Assistant

## 2024-11-27 DIAGNOSIS — I890 Lymphedema, not elsewhere classified: Secondary | ICD-10-CM | POA: Diagnosis not present

## 2024-12-11 ENCOUNTER — Encounter: Admitting: Physician Assistant

## 2024-12-18 ENCOUNTER — Encounter: Admitting: Physician Assistant

## 2025-01-22 ENCOUNTER — Ambulatory Visit (INDEPENDENT_AMBULATORY_CARE_PROVIDER_SITE_OTHER): Admitting: Nurse Practitioner

## 2025-01-22 ENCOUNTER — Ambulatory Visit (INDEPENDENT_AMBULATORY_CARE_PROVIDER_SITE_OTHER): Admitting: Vascular Surgery
